# Patient Record
Sex: Female | Born: 1955 | Race: Black or African American | Hispanic: No | Marital: Single | Smoking: Current every day smoker
Health system: Southern US, Community
[De-identification: ages and names within clinical notes are randomized; demographics above are authoritative.]

## PROBLEM LIST (undated history)

## (undated) DIAGNOSIS — E7800 Pure hypercholesterolemia, unspecified: Secondary | ICD-10-CM

## (undated) DIAGNOSIS — I10 Essential (primary) hypertension: Secondary | ICD-10-CM

## (undated) DIAGNOSIS — M810 Age-related osteoporosis without current pathological fracture: Secondary | ICD-10-CM

## (undated) DIAGNOSIS — Z972 Presence of dental prosthetic device (complete) (partial): Secondary | ICD-10-CM

## (undated) DIAGNOSIS — J302 Other seasonal allergic rhinitis: Secondary | ICD-10-CM

## (undated) DIAGNOSIS — E119 Type 2 diabetes mellitus without complications: Secondary | ICD-10-CM

---

## 2004-01-09 ENCOUNTER — Ambulatory Visit: Payer: Self-pay | Admitting: Family Medicine

## 2004-10-08 ENCOUNTER — Ambulatory Visit: Payer: Self-pay | Admitting: Family Medicine

## 2005-03-03 ENCOUNTER — Ambulatory Visit: Payer: Self-pay | Admitting: Family Medicine

## 2005-04-18 ENCOUNTER — Ambulatory Visit: Payer: Self-pay | Admitting: Unknown Physician Specialty

## 2006-04-03 ENCOUNTER — Ambulatory Visit: Payer: Self-pay | Admitting: Family Medicine

## 2007-04-28 ENCOUNTER — Ambulatory Visit: Payer: Self-pay | Admitting: Family Medicine

## 2007-11-03 ENCOUNTER — Ambulatory Visit: Payer: Self-pay | Admitting: Family Medicine

## 2007-11-17 ENCOUNTER — Ambulatory Visit: Payer: Self-pay | Admitting: Family Medicine

## 2008-05-17 ENCOUNTER — Ambulatory Visit: Payer: Self-pay | Admitting: Family Medicine

## 2008-12-13 ENCOUNTER — Ambulatory Visit: Payer: Self-pay | Admitting: General Practice

## 2009-10-02 ENCOUNTER — Ambulatory Visit: Payer: Self-pay | Admitting: Family Medicine

## 2009-10-02 ENCOUNTER — Other Ambulatory Visit: Payer: Self-pay | Admitting: Family Medicine

## 2010-05-27 ENCOUNTER — Other Ambulatory Visit: Payer: Self-pay | Admitting: Family Medicine

## 2011-01-20 ENCOUNTER — Other Ambulatory Visit: Payer: Self-pay | Admitting: Physician Assistant

## 2011-07-09 ENCOUNTER — Other Ambulatory Visit: Payer: Self-pay | Admitting: Family Medicine

## 2011-07-09 LAB — COMPREHENSIVE METABOLIC PANEL
Alkaline Phosphatase: 61 U/L (ref 50–136)
Anion Gap: 8 (ref 7–16)
Bilirubin,Total: 0.3 mg/dL (ref 0.2–1.0)
Chloride: 101 mmol/L (ref 98–107)
Co2: 30 mmol/L (ref 21–32)
Creatinine: 0.66 mg/dL (ref 0.60–1.30)
EGFR (African American): >60
EGFR (Non-African Amer.): >60
Glucose: 128 mg/dL — ABNORMAL HIGH (ref 65–99)
Osmolality: 280 (ref 275–301)
Sodium: 139 mmol/L (ref 136–145)
Total Protein: 7.7 g/dL (ref 6.4–8.2)

## 2011-07-09 LAB — LIPID PANEL
HDL Cholesterol: 52 mg/dL (ref 40–60)
Triglycerides: 76 mg/dL (ref 0–200)
VLDL Cholesterol, Calc: 15 mg/dL (ref 5–40)

## 2011-10-29 ENCOUNTER — Ambulatory Visit: Payer: Self-pay | Admitting: Family Medicine

## 2012-02-11 HISTORY — PX: FOOT SURGERY: SHX648

## 2012-03-15 ENCOUNTER — Ambulatory Visit: Payer: Self-pay | Admitting: General Practice

## 2012-04-14 ENCOUNTER — Encounter: Payer: Self-pay | Admitting: Orthopedic Surgery

## 2012-05-11 ENCOUNTER — Encounter: Payer: Self-pay | Admitting: Orthopedic Surgery

## 2012-06-10 ENCOUNTER — Encounter: Payer: Self-pay | Admitting: Orthopedic Surgery

## 2012-10-22 ENCOUNTER — Other Ambulatory Visit: Payer: Self-pay | Admitting: Family Medicine

## 2012-10-22 LAB — COMPREHENSIVE METABOLIC PANEL
Anion Gap: 4 — ABNORMAL LOW (ref 7–16)
BUN: 15 mg/dL (ref 7–18)
Chloride: 107 mmol/L (ref 98–107)
Co2: 28 mmol/L (ref 21–32)
EGFR (African American): >60
EGFR (Non-African Amer.): >60
Glucose: 111 mg/dL — ABNORMAL HIGH (ref 65–99)
Osmolality: 279 (ref 275–301)
Potassium: 3.9 mmol/L (ref 3.5–5.1)
Sodium: 139 mmol/L (ref 136–145)
Total Protein: 7.2 g/dL (ref 6.4–8.2)

## 2012-10-22 LAB — LIPID PANEL
Cholesterol: 137 mg/dL (ref 0–200)
Triglycerides: 62 mg/dL (ref 0–200)

## 2012-10-22 LAB — TSH: Thyroid Stimulating Horm: 1.86 u[IU]/mL

## 2012-10-29 ENCOUNTER — Ambulatory Visit: Payer: Self-pay | Admitting: Family Medicine

## 2013-09-29 ENCOUNTER — Ambulatory Visit: Payer: Self-pay | Admitting: Family Medicine

## 2013-10-11 ENCOUNTER — Ambulatory Visit: Payer: Self-pay | Admitting: Family Medicine

## 2014-01-12 ENCOUNTER — Ambulatory Visit: Payer: Self-pay | Admitting: Family Medicine

## 2014-03-31 ENCOUNTER — Ambulatory Visit: Payer: Self-pay | Admitting: Family Medicine

## 2014-04-19 ENCOUNTER — Ambulatory Visit: Payer: Self-pay | Admitting: Family Medicine

## 2014-05-17 ENCOUNTER — Ambulatory Visit
Admit: 2014-05-17 | Disposition: A | Discharge status: Home / Self Care | Payer: Self-pay | Attending: Ophthalmology | Admitting: Ophthalmology

## 2014-05-17 HISTORY — PX: CATARACT EXTRACTION: SUR2

## 2014-07-18 ENCOUNTER — Encounter: Payer: Self-pay | Admitting: *Deleted

## 2014-07-19 ENCOUNTER — Other Ambulatory Visit: Payer: Self-pay | Admitting: Family Medicine

## 2014-07-25 NOTE — Discharge Instructions (Signed)

## 2014-07-26 ENCOUNTER — Encounter: Payer: Self-pay | Admitting: *Deleted

## 2014-07-26 ENCOUNTER — Encounter
Admission: RE | Disposition: A | Discharge status: Home / Self Care | Payer: Self-pay | Source: Ambulatory Visit | Attending: Ophthalmology

## 2014-07-26 ENCOUNTER — Ambulatory Visit: Payer: 59 | Admitting: Anesthesiology

## 2014-07-26 ENCOUNTER — Ambulatory Visit
Admission: RE | Admit: 2014-07-26 | Discharge: 2014-07-26 | Disposition: A | Discharge status: Home / Self Care | Payer: 59 | Source: Ambulatory Visit | Attending: Ophthalmology | Admitting: Ophthalmology

## 2014-07-26 DIAGNOSIS — H2511 Age-related nuclear cataract, right eye: Secondary | ICD-10-CM | POA: Insufficient documentation

## 2014-07-26 DIAGNOSIS — E780 Pure hypercholesterolemia: Secondary | ICD-10-CM | POA: Diagnosis not present

## 2014-07-26 DIAGNOSIS — E1136 Type 2 diabetes mellitus with diabetic cataract: Secondary | ICD-10-CM | POA: Insufficient documentation

## 2014-07-26 DIAGNOSIS — M810 Age-related osteoporosis without current pathological fracture: Secondary | ICD-10-CM | POA: Insufficient documentation

## 2014-07-26 DIAGNOSIS — F17200 Nicotine dependence, unspecified, uncomplicated: Secondary | ICD-10-CM | POA: Insufficient documentation

## 2014-07-26 DIAGNOSIS — Z9889 Other specified postprocedural states: Secondary | ICD-10-CM | POA: Diagnosis not present

## 2014-07-26 DIAGNOSIS — Z9842 Cataract extraction status, left eye: Secondary | ICD-10-CM | POA: Diagnosis not present

## 2014-07-26 DIAGNOSIS — Z79899 Other long term (current) drug therapy: Secondary | ICD-10-CM | POA: Insufficient documentation

## 2014-07-26 DIAGNOSIS — I10 Essential (primary) hypertension: Secondary | ICD-10-CM | POA: Diagnosis not present

## 2014-07-26 DIAGNOSIS — Z7982 Long term (current) use of aspirin: Secondary | ICD-10-CM | POA: Insufficient documentation

## 2014-07-26 HISTORY — DX: Essential (primary) hypertension: I10

## 2014-07-26 HISTORY — DX: Type 2 diabetes mellitus without complications: E11.9

## 2014-07-26 HISTORY — PX: CATARACT EXTRACTION W/PHACO: SHX586

## 2014-07-26 HISTORY — DX: Pure hypercholesterolemia, unspecified: E78.00

## 2014-07-26 HISTORY — DX: Other seasonal allergic rhinitis: J30.2

## 2014-07-26 HISTORY — DX: Presence of dental prosthetic device (complete) (partial): Z97.2

## 2014-07-26 HISTORY — DX: Age-related osteoporosis without current pathological fracture: M81.0

## 2014-07-26 LAB — GLUCOSE, CAPILLARY
Glucose-Capillary: 146 mg/dL — ABNORMAL HIGH (ref 65–99)
Glucose-Capillary: 136 mg/dL — ABNORMAL HIGH (ref 65–99)

## 2014-07-26 SURGERY — PHACOEMULSIFICATION, CATARACT, WITH IOL INSERTION
Anesthesia: Monitor Anesthesia Care | Laterality: Right | Wound class: Clean

## 2014-07-26 MED ORDER — BRIMONIDINE TARTRATE 0.2 % OP SOLN
OPHTHALMIC | Status: DC | PRN
  Administered 2014-07-26: 1 [drp] via OPHTHALMIC

## 2014-07-26 MED ORDER — TIMOLOL MALEATE 0.5 % OP SOLN
OPHTHALMIC | Status: DC | PRN
  Administered 2014-07-26: 1 [drp] via OPHTHALMIC

## 2014-07-26 MED ORDER — TETRACAINE HCL 0.5 % OP SOLN
1.0000 [drp] | OPHTHALMIC | Status: DC | PRN
  Administered 2014-07-26: 1 [drp] via OPHTHALMIC

## 2014-07-26 MED ORDER — NA HYALUR & NA CHOND-NA HYALUR 0.4-0.35 ML IO KIT
PACK | INTRAOCULAR | Status: DC | PRN
  Administered 2014-07-26: 1 mL via INTRAOCULAR

## 2014-07-26 MED ORDER — CEFUROXIME OPHTHALMIC INJECTION 1 MG/0.1 ML
INJECTION | OPHTHALMIC | Status: DC | PRN
  Administered 2014-07-26: 1 mg via INTRACAMERAL

## 2014-07-26 MED ORDER — EPINEPHRINE HCL 1 MG/ML IJ SOLN
INTRAMUSCULAR | Status: DC | PRN
  Administered 2014-07-26: 70 mL via OPHTHALMIC

## 2014-07-26 MED ORDER — ARMC OPHTHALMIC DILATING GEL
1.0000 "application " | OPHTHALMIC | Status: DC | PRN
  Administered 2014-07-26 (×2): 1 via OPHTHALMIC

## 2014-07-26 MED ORDER — MIDAZOLAM HCL 2 MG/2ML IJ SOLN
INTRAMUSCULAR | Status: DC | PRN
  Administered 2014-07-26: 2 mg via INTRAVENOUS

## 2014-07-26 MED ORDER — POVIDONE-IODINE 5 % OP SOLN
1.0000 "application " | OPHTHALMIC | Status: DC | PRN
  Administered 2014-07-26: 1 via OPHTHALMIC

## 2014-07-26 MED ORDER — FENTANYL CITRATE (PF) 100 MCG/2ML IJ SOLN
INTRAMUSCULAR | Status: DC | PRN
  Administered 2014-07-26: 50 ug via INTRAVENOUS

## 2014-07-26 SURGICAL SUPPLY — 26 items
CANNULA ANT/CHMB 27GA (MISCELLANEOUS) ×2 IMPLANT
GLOVE SURG LX 7.5 STRW (GLOVE) ×1
GLOVE SURG LX STRL 7.5 STRW (GLOVE) ×1 IMPLANT
GLOVE SURG TRIUMPH 8.0 PF LTX (GLOVE) ×2 IMPLANT
GOWN STRL REUS W/ TWL LRG LVL3 (GOWN DISPOSABLE) ×2 IMPLANT
GOWN STRL REUS W/TWL LRG LVL3 (GOWN DISPOSABLE) ×2
LENS IOL TECNIS 19.5 (Intraocular Lens) ×2 IMPLANT
LENS IOL TECNIS MONO 1P 19.5 (Intraocular Lens) ×1 IMPLANT
MARKER SKIN SURG W/RULER VIO (MISCELLANEOUS) ×2 IMPLANT
NDL RETROBULBAR .5 NSTRL (NEEDLE) IMPLANT
NEEDLE FILTER BLUNT 18X 1/2SAF (NEEDLE) ×1
NEEDLE FILTER BLUNT 18X1 1/2 (NEEDLE) ×1 IMPLANT
PACK CATARACT BRASINGTON (MISCELLANEOUS) ×2 IMPLANT
PACK EYE AFTER SURG (MISCELLANEOUS) ×2 IMPLANT
PACK OPTHALMIC (MISCELLANEOUS) ×2 IMPLANT
RING MALYGIN 7.0 (MISCELLANEOUS) IMPLANT
SUT ETHILON 10-0 CS-B-6CS-B-6 (SUTURE)
SUT VICRYL  9 0 (SUTURE)
SUT VICRYL 9 0 (SUTURE) IMPLANT
SUTURE EHLN 10-0 CS-B-6CS-B-6 (SUTURE) IMPLANT
SYR 3ML LL SCALE MARK (SYRINGE) ×2 IMPLANT
SYR 5ML LL (SYRINGE) IMPLANT
SYR TB 1ML LUER SLIP (SYRINGE) ×2 IMPLANT
WATER STERILE IRR 250ML POUR (IV SOLUTION) ×2 IMPLANT
WATER STERILE IRR 500ML POUR (IV SOLUTION) IMPLANT
WIPE NON LINTING 3.25X3.25 (MISCELLANEOUS) ×2 IMPLANT

## 2014-07-26 NOTE — Anesthesia Procedure Notes (Addendum)
Procedure Name: MAC Date/Time: 07/26/2014 8:48 AM Performed by: Andee Poles Pre-anesthesia Checklist: Patient identified, Emergency Drugs available, Suction available, Timeout performed and Patient being monitored Patient Re-evaluated:Patient Re-evaluated prior to inductionOxygen Delivery Method: Nasal cannula Placement Confirmation: positive ETCO2

## 2014-07-26 NOTE — H&P (Signed)
  The History and Physical notes were scanned in.  The patient remains stable and unchanged from the H&P.   Previous H&P reviewed, patient examined, and there are no changes.  Tracey Tucker 07/26/2014 8:04 AM  

## 2014-07-26 NOTE — Anesthesia Postprocedure Evaluation (Signed)
  Anesthesia Post-op Note  Patient: Tracey Tucker  Procedure(s) Performed: Procedure(s) with comments: CATARACT EXTRACTION PHACO AND INTRAOCULAR LENS PLACEMENT (IOC) (Right) - DIABETIC  Anesthesia type:MAC  Patient location: PACU  Post pain: Pain level controlled  Post assessment: Post-op Vital signs reviewed, Patient's Cardiovascular Status Stable, Respiratory Function Stable, Patent Airway and No signs of Nausea or vomiting  Post vital signs: Reviewed and stable  Last Vitals:  Filed Vitals:   07/26/14 0859  BP:   Pulse:   Temp: 36.3 C  Resp:     Level of consciousness: awake, alert  and patient cooperative  Complications: No apparent anesthesia complications

## 2014-07-26 NOTE — Op Note (Signed)
LOCATION:  [facility]   PREOPERATIVE DIAGNOSIS:    Nuclear sclerotic cataract right eye. H25.11   POSTOPERATIVE DIAGNOSIS:  Nuclear sclerotic cataract right eye.     PROCEDURE:  Phacoemusification with posterior chamber intraocular lens placement of the right eye   LENS:   Implant Name Type Inv. Item Serial No. Manufacturer Lot No. LRB No. Used  LENS IMPL INTRAOC [id number] 19.5 - [id number] Intraocular Lens LENS IMPL INTRAOC [id number] 19.5 [id number]   Right 1        ULTRASOUND TIME: 10 % of 1 minutes, 4 seconds.  CDE 6.4   SURGEON:  Deirdre Evener, MD   ANESTHESIA:  Topical with tetracaine drops and 2% Xylocaine jelly.   COMPLICATIONS:  None.   DESCRIPTION OF PROCEDURE:  The patient was identified in the holding room and transported to the operating room and placed in the supine position under the operating microscope.  The right eye was identified as the operative eye and it was prepped and draped in the usual sterile ophthalmic fashion.   A 1 millimeter clear-corneal paracentesis was made at the 12:00 position.  The anterior chamber was filled with Viscoat viscoelastic.  A 2.4 millimeter keratome was used to make a near-clear corneal incision at the 9:00 position.  A curvilinear capsulorrhexis was made with a cystotome and capsulorrhexis forceps.  Balanced salt solution was used to hydrodissect and hydrodelineate the nucleus.   Phacoemulsification was then used in stop and chop fashion to remove the lens nucleus and epinucleus.  The remaining cortex was then removed using the irrigation and aspiration handpiece. Provisc was then placed into the capsular bag to distend it for lens placement.  A lens was then injected into the capsular bag.  The remaining viscoelastic was aspirated.   Wounds were hydrated with balanced salt solution.  The anterior chamber was inflated to a physiologic pressure with balanced salt solution.  No wound leaks were noted. Cefuroxime 0.1 ml of a  10mg /ml solution was injected into the anterior chamber for a dose of 1 mg of intracameral antibiotic at the completion of the case.   Timolol and Brimonidine drops were applied to the eye.  The patient was taken to the recovery room in stable condition without complications of anesthesia or surgery.   Jamela Cumbo 07/26/2014, 8:57 AM

## 2014-07-26 NOTE — Transfer of Care (Signed)
Immediate Anesthesia Transfer of Care Note  Patient: Tracey Tucker  Procedure(s) Performed: Procedure(s) with comments: CATARACT EXTRACTION PHACO AND INTRAOCULAR LENS PLACEMENT (IOC) (Right) - DIABETIC  Patient Location: PACU  Anesthesia Type: MAC  Level of Consciousness: awake, alert  and patient cooperative  Airway and Oxygen Therapy: Patient Spontanous Breathing and Patient connected to supplemental oxygen  Post-op Assessment: Post-op Vital signs reviewed, Patient's Cardiovascular Status Stable, Respiratory Function Stable, Patent Airway and No signs of Nausea or vomiting  Post-op Vital Signs: Reviewed and stable  Complications: No apparent anesthesia complications

## 2014-07-26 NOTE — Anesthesia Preprocedure Evaluation (Signed)
Anesthesia Evaluation    Airway Mallampati: II  TM Distance: >3 FB Neck ROM: Full    Dental no notable dental hx.    Pulmonary Current Smoker,  breath sounds clear to auscultation  Pulmonary exam normal       Cardiovascular hypertension, Normal cardiovascular examRhythm:Regular Rate:Normal     Neuro/Psych    GI/Hepatic   Endo/Other  diabetes  Renal/GU      Musculoskeletal   Abdominal   Peds  Hematology   Anesthesia Other Findings   Reproductive/Obstetrics                             Anesthesia Physical Anesthesia Plan  ASA: II  Anesthesia Plan: MAC   Post-op Pain Management:    Induction: Intravenous  Airway Management Planned:   Additional Equipment:   Intra-op Plan:   Post-operative Plan: Extubation in OR  Informed Consent: I have reviewed the patients History and Physical, chart, labs and discussed the procedure including the risks, benefits and alternatives for the proposed anesthesia with the patient or authorized representative who has indicated his/her understanding and acceptance.   Dental advisory given  Plan Discussed with: CRNA  Anesthesia Plan Comments:         Anesthesia Quick Evaluation

## 2014-07-27 ENCOUNTER — Encounter: Payer: Self-pay | Admitting: Ophthalmology

## 2014-08-03 ENCOUNTER — Other Ambulatory Visit: Payer: Self-pay | Admitting: Family Medicine

## 2014-08-04 ENCOUNTER — Other Ambulatory Visit: Payer: Self-pay | Admitting: Emergency Medicine

## 2014-08-04 MED ORDER — PROPRANOLOL HCL 10 MG PO TABS: 10.0000 mg | ORAL_TABLET | Freq: Two times a day (BID) | ORAL | Status: DC

## 2014-08-04 NOTE — Telephone Encounter (Signed)
Script sent to [facility]

## 2014-08-22 ENCOUNTER — Ambulatory Visit (INDEPENDENT_AMBULATORY_CARE_PROVIDER_SITE_OTHER): Payer: 59 | Admitting: Family Medicine

## 2014-08-22 ENCOUNTER — Encounter: Payer: Self-pay | Admitting: Family Medicine

## 2014-08-22 ENCOUNTER — Encounter (INDEPENDENT_AMBULATORY_CARE_PROVIDER_SITE_OTHER): Payer: Self-pay

## 2014-08-22 VITALS — BP 110/64 | HR 87 | Temp 98.6°F | Resp 16 | Ht 59.0 in | Wt 185.5 lb

## 2014-08-22 DIAGNOSIS — E119 Type 2 diabetes mellitus without complications: Secondary | ICD-10-CM

## 2014-08-22 DIAGNOSIS — I152 Hypertension secondary to endocrine disorders: Secondary | ICD-10-CM | POA: Insufficient documentation

## 2014-08-22 DIAGNOSIS — I10 Essential (primary) hypertension: Secondary | ICD-10-CM | POA: Diagnosis not present

## 2014-08-22 DIAGNOSIS — E1165 Type 2 diabetes mellitus with hyperglycemia: Secondary | ICD-10-CM | POA: Insufficient documentation

## 2014-08-22 DIAGNOSIS — E669 Obesity, unspecified: Secondary | ICD-10-CM | POA: Insufficient documentation

## 2014-08-22 DIAGNOSIS — E1159 Type 2 diabetes mellitus with other circulatory complications: Secondary | ICD-10-CM | POA: Insufficient documentation

## 2014-08-22 DIAGNOSIS — E785 Hyperlipidemia, unspecified: Secondary | ICD-10-CM

## 2014-08-22 DIAGNOSIS — E1169 Type 2 diabetes mellitus with other specified complication: Secondary | ICD-10-CM | POA: Insufficient documentation

## 2014-08-22 LAB — POCT GLYCOSYLATED HEMOGLOBIN (HGB A1C): Hemoglobin A1C: 7.1

## 2014-08-22 LAB — GLUCOSE, POCT (MANUAL RESULT ENTRY): POC Glucose: 98 mg/dl (ref 70–99)

## 2014-08-22 NOTE — Patient Instructions (Signed)

## 2014-08-22 NOTE — Progress Notes (Signed)
Name: Tracey Tucker   MRN: [id number]    DOB: 10-23-1955   Date:08/22/2014       Progress Note  Subjective  Chief Complaint  Chief Complaint  Patient presents with  . Hypertension  . Diabetes  . Hyperlipidemia    Hypertension This is a chronic problem. The current episode started more than 1 year ago. The problem is unchanged. The problem is controlled. Associated symptoms include blurred vision. Pertinent negatives include no chest pain, headaches, neck pain, orthopnea, palpitations or shortness of breath. There are no associated agents to hypertension. Risk factors for coronary artery disease include diabetes mellitus, dyslipidemia, family history, obesity, post-menopausal state, sedentary lifestyle, smoking/tobacco exposure and stress. Past treatments include angiotensin blockers, beta blockers and diuretics. The current treatment provides moderate improvement. There are no compliance problems.   Diabetes She presents for her follow-up diabetic visit. She has type 2 diabetes mellitus. Her disease course has been stable. There are no hypoglycemic associated symptoms. Pertinent negatives for hypoglycemia include no dizziness, headaches, nervousness/anxiousness, seizures or tremors. Associated symptoms include blurred vision. Pertinent negatives for diabetes include no chest pain, no weakness and no weight loss. Symptoms are stable. Current diabetic treatment includes oral agent (triple therapy). She is compliant with treatment all of the time. Her weight is decreasing steadily. She is following a diabetic diet. She rarely participates in exercise. Her overall blood glucose range is 90-110 mg/dl.  Hyperlipidemia This is a chronic problem. The current episode started more than 1 year ago. The problem is controlled. Recent lipid tests were reviewed and are normal. Exacerbating diseases include diabetes and obesity. Factors aggravating her hyperlipidemia include fatty foods. Pertinent negatives include  no chest pain, focal weakness, myalgias or shortness of breath. Current antihyperlipidemic treatment includes statins. The current treatment provides moderate improvement of lipids.      Past Medical History  Diagnosis Date  . Diabetes mellitus without complication   . Hypercholesteremia   . Hypertension   . Seasonal allergies   . Osteoporosis   . Wears dentures     full upper, partial lower    History  Substance Use Topics  . Smoking status: Current Every Day Smoker -- 1.00 packs/day for 20 years    Types: Cigarettes  . Smokeless tobacco: Not on file  . Alcohol Use: No     Current outpatient prescriptions:  .  aspirin 81 MG tablet, Take 81 mg by mouth daily. PM, Disp: , Rfl:  .  benazepril (LOTENSIN) 40 MG tablet, Take 40 mg by mouth daily. AM, Disp: , Rfl:  .  bumetanide (BUMEX) 0.5 MG tablet, TAKE 1 TABLET BY MOUTH TWICE DAILY, Disp: 180 tablet, Rfl: 1 .  glyBURIDE (DIABETA) 5 MG tablet, Take 5 mg by mouth 2 (two) times daily with a meal., Disp: , Rfl:  .  latanoprost (XALATAN) 0.005 % ophthalmic solution, 1 drop at bedtime., Disp: , Rfl:  .  metFORMIN (GLUCOPHAGE) 500 MG tablet, Take by mouth 2 (two) times daily with a meal., Disp: , Rfl:  .  pioglitazone (ACTOS) 30 MG tablet, Take 30 mg by mouth daily. AM, Disp: , Rfl:  .  propranolol (INDERAL) 10 MG tablet, TAKE 1 TABLET BY MOUTH TWICE A DAY, Disp: 60 tablet, Rfl: 5 .  simvastatin (ZOCOR) 40 MG tablet, Take 40 mg by mouth daily. PM, Disp: , Rfl:   No Known Allergies  Review of Systems  Constitutional: Negative for fever, chills and weight loss.  HENT: Negative for congestion, hearing loss, sore throat and  tinnitus.   Eyes: Positive for blurred vision. Negative for double vision and redness.  Respiratory: Negative for cough, hemoptysis and shortness of breath.   Cardiovascular: Negative for chest pain, palpitations, orthopnea, claudication and leg swelling.  Gastrointestinal: Negative for heartburn, nausea, vomiting,  diarrhea, constipation and blood in stool.  Genitourinary: Negative for dysuria, urgency, frequency and hematuria.  Musculoskeletal: Negative for myalgias, back pain, joint pain, falls and neck pain.  Skin: Negative for itching.  Neurological: Negative for dizziness, tingling, tremors, focal weakness, seizures, loss of consciousness, weakness and headaches.  Endo/Heme/Allergies: Does not bruise/bleed easily.  Psychiatric/Behavioral: Negative for depression and substance abuse. The patient is not nervous/anxious and does not have insomnia.      Objective  Filed Vitals:   08/22/14 1012  BP: 110/64  Pulse: 87  Temp: 98.6 F (37 C)  TempSrc: Oral  Resp: 16  Height: 4\' 11"  (1.499 m)  Weight: 185 lb 8 oz (84.142 kg)  SpO2: 98%     Physical Exam  Constitutional: She is oriented to person, place, and time and well-developed, well-nourished, and in no distress.  Obese  HENT:  Head: Normocephalic.  Eyes: EOM are normal. Pupils are equal, round, and reactive to light.  Neck: Normal range of motion. No thyromegaly present.  Cardiovascular: Normal rate, regular rhythm and normal heart sounds.   No murmur heard. Pulmonary/Chest: Effort normal and breath sounds normal.  Abdominal: Soft. Bowel sounds are normal.  Musculoskeletal: Normal range of motion. She exhibits no edema.  Neurological: She is alert and oriented to person, place, and time. No cranial nerve deficit. Gait normal.  Skin: Skin is warm and dry. No rash noted.  Psychiatric: Memory and affect normal.      Assessment & Plan  1. Type 2 diabetes mellitus without complication Near goal well-controlled - POCT HgB A1C - POCT Glucose (CBG)  2. Essential hypertension Well-controlled  3. Hyperlipidemia Lipid panel today  4. Obesity (BMI 35.0-39.9 without comorbidity) Handout

## 2014-08-22 NOTE — Addendum Note (Signed)
Addended by: Dennison MascotMORRISEY, Kathrine Rieves on: 08/22/2014 10:51 AM   Modules accepted: Orders

## 2014-08-30 ENCOUNTER — Other Ambulatory Visit
Admission: RE | Admit: 2014-08-30 | Discharge: 2014-08-30 | Disposition: A | Discharge status: Home / Self Care | Payer: 59 | Source: Ambulatory Visit | Attending: Family Medicine | Admitting: Family Medicine

## 2014-08-30 DIAGNOSIS — E785 Hyperlipidemia, unspecified: Secondary | ICD-10-CM | POA: Insufficient documentation

## 2014-08-30 LAB — LIPID PANEL
Cholesterol: 142 mg/dL (ref 0–200)
HDL: 54 mg/dL (ref >40)
LDL CALC: 76 mg/dL (ref 0–99)
Total CHOL/HDL Ratio: 2.6 RATIO
Triglycerides: 58 mg/dL (ref <150)
VLDL: 12 mg/dL (ref 0–40)

## 2014-08-30 LAB — COMPREHENSIVE METABOLIC PANEL
ALT: 14 U/L (ref 14–54)
ANION GAP: 12 (ref 5–15)
AST: 25 U/L (ref 15–41)
Albumin: 4.3 g/dL (ref 3.5–5.0)
Alkaline Phosphatase: 49 U/L (ref 38–126)
BILIRUBIN TOTAL: 0.4 mg/dL (ref 0.3–1.2)
BUN: 20 mg/dL (ref 6–20)
CHLORIDE: 98 mmol/L — AB (ref 101–111)
CO2: 26 mmol/L (ref 22–32)
Calcium: 9.8 mg/dL (ref 8.9–10.3)
Creatinine, Ser: 0.86 mg/dL (ref 0.44–1.00)
GFR calc Af Amer: >60 mL/min (ref >60)
GFR calc non Af Amer: >60 mL/min (ref >60)
Glucose, Bld: 150 mg/dL — ABNORMAL HIGH (ref 65–99)
Potassium: 4 mmol/L (ref 3.5–5.1)
SODIUM: 136 mmol/L (ref 135–145)
Total Protein: 7.4 g/dL (ref 6.5–8.1)

## 2014-08-30 LAB — TSH: TSH: 2.1 u[IU]/mL (ref 0.350–4.500)

## 2014-08-31 ENCOUNTER — Telehealth: Payer: Self-pay | Admitting: Emergency Medicine

## 2014-08-31 NOTE — Telephone Encounter (Signed)
Patient notified of stable lab results.

## 2014-12-15 ENCOUNTER — Other Ambulatory Visit: Payer: Self-pay | Admitting: Family Medicine

## 2014-12-25 ENCOUNTER — Ambulatory Visit
Admission: RE | Admit: 2014-12-25 | Discharge: 2014-12-25 | Disposition: A | Discharge status: Home / Self Care | Payer: 59 | Source: Ambulatory Visit | Attending: Family Medicine | Admitting: Family Medicine

## 2014-12-25 ENCOUNTER — Ambulatory Visit (INDEPENDENT_AMBULATORY_CARE_PROVIDER_SITE_OTHER): Payer: 59 | Admitting: Family Medicine

## 2014-12-25 ENCOUNTER — Encounter: Payer: Self-pay | Admitting: Family Medicine

## 2014-12-25 ENCOUNTER — Telehealth: Payer: Self-pay | Admitting: Emergency Medicine

## 2014-12-25 VITALS — BP 110/62 | HR 96 | Temp 98.5°F | Resp 14 | Ht 59.0 in | Wt 183.6 lb

## 2014-12-25 DIAGNOSIS — Z23 Encounter for immunization: Secondary | ICD-10-CM | POA: Diagnosis not present

## 2014-12-25 DIAGNOSIS — I10 Essential (primary) hypertension: Secondary | ICD-10-CM | POA: Diagnosis not present

## 2014-12-25 DIAGNOSIS — M1712 Unilateral primary osteoarthritis, left knee: Secondary | ICD-10-CM | POA: Insufficient documentation

## 2014-12-25 DIAGNOSIS — E785 Hyperlipidemia, unspecified: Secondary | ICD-10-CM

## 2014-12-25 DIAGNOSIS — M179 Osteoarthritis of knee, unspecified: Secondary | ICD-10-CM | POA: Diagnosis not present

## 2014-12-25 DIAGNOSIS — E1169 Type 2 diabetes mellitus with other specified complication: Secondary | ICD-10-CM

## 2014-12-25 LAB — POCT UA - MICROALBUMIN: MICROALBUMIN (UR) POC: 20 mg/L

## 2014-12-25 LAB — GLUCOSE, POCT (MANUAL RESULT ENTRY): POC Glucose: 136 mg/dl — AB (ref 70–99)

## 2014-12-25 LAB — POCT GLYCOSYLATED HEMOGLOBIN (HGB A1C): Hemoglobin A1C: 7.1

## 2014-12-25 MED ORDER — BENAZEPRIL HCL 40 MG PO TABS: 40.0000 mg | ORAL_TABLET | Freq: Every day | ORAL | Status: DC

## 2014-12-25 MED ORDER — SIMVASTATIN 40 MG PO TABS: ORAL_TABLET | ORAL | Status: DC

## 2014-12-25 MED ORDER — MELOXICAM 15 MG PO TABS: 15.0000 mg | ORAL_TABLET | Freq: Every day | ORAL | Status: DC

## 2014-12-25 MED ORDER — GLYBURIDE 5 MG PO TABS: 5.0000 mg | ORAL_TABLET | Freq: Two times a day (BID) | ORAL | Status: DC

## 2014-12-25 MED ORDER — PROPRANOLOL HCL 10 MG PO TABS: 10.0000 mg | ORAL_TABLET | Freq: Two times a day (BID) | ORAL | Status: DC

## 2014-12-25 MED ORDER — PIOGLITAZONE HCL 30 MG PO TABS: 30.0000 mg | ORAL_TABLET | Freq: Every day | ORAL | Status: DC

## 2014-12-25 NOTE — Telephone Encounter (Signed)
Patient notified of x-ray results.

## 2014-12-25 NOTE — Progress Notes (Signed)
Name: Tracey Tucker   MRN: [id number]    DOB: [birth year]   Date:12/25/2014       Progress Note  Subjective  Chief Complaint  Chief Complaint  Patient presents with  . Hypertension    4 month follow up  . Diabetes  . Hyperlipidemia  . Leg Pain    left leg for 3 days    HPI  Diabetes  Patient presents for follow-up of diabetes which is present for over 5 years. Is currently on a regimen of metformin 500 mg daily and Actos 30 mg along with glyburide 5 mg daily. Patient states is sometimes with their diet and exercise. There's been no hypoglycemic episodes and there is no polyuria polydipsia polyphagia. His average fasting glucoses been in the low around like with a high around blank . There is no end organ disease.  Last diabetic eye exam was less than one year ago.   Last visit with dietitian was less than one year ago. Last microalbumin was obtained today and is 20 which is normal .   Hypertension   Patient presents for follow-up of hypertension. It has been present for over 5 years.  Patient states that there is compliance with medical regimen which consists of propranolol 10 mg daily benazepril 40 mg daily with Bumex 0.5 mg daily . There is no end organ disease. Cardiac risk factors include hypertension hyperlipidemia and diabetes. And obesity  Exercise regimen consist of some walking .  Diet consist of ADA .  Hyperlipidemia  Patient has a history of hyperlipidemia for over 5 years.  Current medical regimen consist of simvastatin 40 mg daily at bedtime .  Compliance is good .  Diet and exercise are currently followed well .   Marland Kitchen.   There have been no side effects from the medication.    Leg pain  Patient states she was walking about 3 days ago when she had the sudden onset of left knee pain which is now more the posterior area. There was no locking or popping or giving way of the knee while she was ambulating. There is no history of any antecedent trauma. She's been tried  over-the-counter meds and has placed a knee brace on the area.    Past Medical History  Diagnosis Date  . Diabetes mellitus without complication (HCC)   . Hypercholesteremia   . Hypertension   . Seasonal allergies   . Osteoporosis   . Wears dentures     full upper, partial lower    Social History  Substance Use Topics  . Smoking status: Current Every Day Smoker -- 1.00 packs/day for 20 years    Types: Cigarettes  . Smokeless tobacco: Not on file  . Alcohol Use: No     Current outpatient prescriptions:  .  aspirin 81 MG tablet, Take 81 mg by mouth daily. PM, Disp: , Rfl:  .  benazepril (LOTENSIN) 40 MG tablet, Take 40 mg by mouth daily. AM, Disp: , Rfl:  .  bumetanide (BUMEX) 0.5 MG tablet, TAKE 1 TABLET BY MOUTH TWICE DAILY, Disp: 180 tablet, Rfl: 1 .  glyBURIDE (DIABETA) 5 MG tablet, Take 5 mg by mouth 2 (two) times daily with a meal., Disp: , Rfl:  .  latanoprost (XALATAN) 0.005 % ophthalmic solution, 1 drop at bedtime., Disp: , Rfl:  .  metFORMIN (GLUCOPHAGE) 500 MG tablet, Take by mouth 2 (two) times daily with a meal., Disp: , Rfl:  .  pioglitazone (ACTOS) 30 MG tablet, Take 30 mg  by mouth daily. AM, Disp: , Rfl:  .  propranolol (INDERAL) 10 MG tablet, TAKE 1 TABLET BY MOUTH TWICE A DAY, Disp: 60 tablet, Rfl: 5 .  simvastatin (ZOCOR) 40 MG tablet, TAKE 1 TABLET BY MOUTH NIGHTLY AT BEDTIME, Disp: 90 tablet, Rfl: 1  No Known Allergies  Review of Systems  Constitutional: Negative for fever, chills and weight loss.  HENT: Negative for congestion, hearing loss, sore throat and tinnitus.   Eyes: Negative for blurred vision, double vision and redness.  Respiratory: Negative for cough, hemoptysis and shortness of breath.   Cardiovascular: Negative for chest pain, palpitations, orthopnea, claudication and leg swelling.  Gastrointestinal: Negative for heartburn, nausea, vomiting, diarrhea, constipation and blood in stool.  Genitourinary: Negative for dysuria, urgency, frequency  and hematuria.  Musculoskeletal: Positive for joint pain (left legleft knee pain). Negative for myalgias, back pain, falls and neck pain.  Skin: Negative for itching.  Neurological: Negative for dizziness, tingling, tremors, focal weakness, seizures, loss of consciousness, weakness and headaches.  Endo/Heme/Allergies: Does not bruise/bleed easily.  Psychiatric/Behavioral: Negative for depression and substance abuse. The patient is not nervous/anxious and does not have insomnia.      Objective  Filed Vitals:   12/25/14 1026  BP: 110/62  Pulse: 96  Temp: 98.5 F (36.9 C)  TempSrc: Oral  Resp: 14  Height:  (1.499 m)  Weight: 183 lb 9.6 oz (83.28 kg)  SpO2: 96%     Physical Exam  Constitutional: She is oriented to person, place, and time.  Obese female in no acute distress  HENT:  Head: Normocephalic.  Eyes: EOM are normal. Pupils are equal, round, and reactive to light.  Neck: Normal range of motion. No thyromegaly present.  Cardiovascular: Normal rate, regular rhythm and normal heart sounds.   No murmur heard. Pulmonary/Chest: Effort normal and breath sounds normal.  Abdominal: Soft. Bowel sounds are normal.  Musculoskeletal: She exhibits no edema.  There is some mild valgus the allergy lists deformity of the left knee. There is no effusion  Neurological: She is alert and oriented to person, place, and time. No cranial nerve deficit. Gait normal.  Skin: Skin is warm and dry. No rash noted.  Psychiatric: Memory and affect normal.      Assessment & Plan  1. Type 2 diabetes mellitus with other specified complication (HCC) Near goal - POCT HgB A1C - POCT Glucose (CBG) - POCT UA - Microalbumin  2. Osteoarthritis of left knee, unspecified osteoarthritis type X-ray and continue using her sleeve - meloxicam (MOBIC) 15 MG tablet; Take 1 tablet (15 mg total) by mouth daily.  Dispense: 30 tablet; Refill: 2 - DG Knee Complete 4 Views Left; Future  3. Need for  pneumococcal vaccination Given today - Pneumococcal polysaccharide vaccine 23-valent greater than or equal to 2yo subcutaneous/IM  4. Hyperlipidemia Well-controlled  5. Essential hypertension Well-controlled

## 2014-12-26 ENCOUNTER — Telehealth: Payer: Self-pay | Admitting: Family Medicine

## 2014-12-26 NOTE — Telephone Encounter (Signed)
Pt would like to know if a mild pain meds can be called in for her knee pain. After walking on it all day its really hurting her. [facility].

## 2014-12-28 MED ORDER — TRAMADOL HCL 50 MG PO TABS: 50.0000 mg | ORAL_TABLET | Freq: Three times a day (TID) | ORAL | Status: DC | PRN

## 2014-12-28 NOTE — Telephone Encounter (Signed)
Patient called to come by office to pick up script for Tramadol

## 2015-01-11 ENCOUNTER — Other Ambulatory Visit: Payer: Self-pay | Admitting: Family Medicine

## 2015-01-30 ENCOUNTER — Other Ambulatory Visit: Payer: Self-pay | Admitting: Family Medicine

## 2015-04-24 ENCOUNTER — Ambulatory Visit: Payer: 59 | Admitting: Family Medicine

## 2015-04-30 DIAGNOSIS — H04123 Dry eye syndrome of bilateral lacrimal glands: Secondary | ICD-10-CM | POA: Diagnosis not present

## 2015-07-04 ENCOUNTER — Other Ambulatory Visit: Payer: Self-pay | Admitting: Family Medicine

## 2015-07-06 ENCOUNTER — Telehealth: Payer: Self-pay | Admitting: Family Medicine

## 2015-07-06 NOTE — Telephone Encounter (Signed)
errenous °

## 2015-07-23 ENCOUNTER — Other Ambulatory Visit: Payer: Self-pay | Admitting: Family Medicine

## 2015-08-13 ENCOUNTER — Other Ambulatory Visit: Payer: Self-pay | Admitting: Family Medicine

## 2015-08-27 ENCOUNTER — Other Ambulatory Visit: Payer: Self-pay | Admitting: Family Medicine

## 2015-08-30 NOTE — Telephone Encounter (Signed)
Patient is scheduled to see Dr. Sherryll BurgerShah on 09/18/15.  She is needing a refill on all of her medications because she is completely out.  Would like enough until to last until her visit.  [facility]  Please call patient once complete.

## 2015-09-03 ENCOUNTER — Telehealth: Payer: Self-pay | Admitting: Family Medicine

## 2015-09-05 ENCOUNTER — Other Ambulatory Visit: Payer: Self-pay | Admitting: Emergency Medicine

## 2015-09-05 MED ORDER — BENAZEPRIL HCL 40 MG PO TABS: ORAL_TABLET | ORAL | 0 refills | Status: DC

## 2015-09-06 ENCOUNTER — Other Ambulatory Visit: Payer: Self-pay | Admitting: Emergency Medicine

## 2015-09-06 MED ORDER — PROPRANOLOL HCL 10 MG PO TABS: 10.0000 mg | ORAL_TABLET | Freq: Two times a day (BID) | ORAL | 0 refills | Status: DC

## 2015-09-18 ENCOUNTER — Ambulatory Visit (INDEPENDENT_AMBULATORY_CARE_PROVIDER_SITE_OTHER): Payer: 59 | Admitting: Family Medicine

## 2015-09-18 ENCOUNTER — Encounter: Payer: Self-pay | Admitting: Family Medicine

## 2015-09-18 VITALS — BP 115/76 | HR 98 | Temp 98.2°F | Resp 17 | Ht 59.0 in | Wt 191.4 lb

## 2015-09-18 DIAGNOSIS — I10 Essential (primary) hypertension: Secondary | ICD-10-CM | POA: Diagnosis not present

## 2015-09-18 DIAGNOSIS — E785 Hyperlipidemia, unspecified: Secondary | ICD-10-CM | POA: Diagnosis not present

## 2015-09-18 DIAGNOSIS — M1712 Unilateral primary osteoarthritis, left knee: Secondary | ICD-10-CM | POA: Insufficient documentation

## 2015-09-18 DIAGNOSIS — E119 Type 2 diabetes mellitus without complications: Secondary | ICD-10-CM | POA: Diagnosis not present

## 2015-09-18 DIAGNOSIS — M129 Arthropathy, unspecified: Secondary | ICD-10-CM

## 2015-09-18 LAB — GLUCOSE, POCT (MANUAL RESULT ENTRY): POC GLUCOSE: 124 mg/dL — AB (ref 70–99)

## 2015-09-18 LAB — POCT GLYCOSYLATED HEMOGLOBIN (HGB A1C): Hemoglobin A1C: 8

## 2015-09-18 MED ORDER — BUMETANIDE 0.5 MG PO TABS: 0.5000 mg | ORAL_TABLET | Freq: Two times a day (BID) | ORAL | 0 refills | Status: DC

## 2015-09-18 MED ORDER — SIMVASTATIN 40 MG PO TABS: ORAL_TABLET | ORAL | 1 refills | Status: DC

## 2015-09-18 MED ORDER — SITAGLIPTIN PHOSPHATE 100 MG PO TABS: 100.0000 mg | ORAL_TABLET | Freq: Every day | ORAL | 0 refills | Status: DC

## 2015-09-18 MED ORDER — METFORMIN HCL 1000 MG PO TABS: 1000.0000 mg | ORAL_TABLET | Freq: Two times a day (BID) | ORAL | 0 refills | Status: DC

## 2015-09-18 MED ORDER — TRAMADOL HCL 50 MG PO TABS: 50.0000 mg | ORAL_TABLET | Freq: Three times a day (TID) | ORAL | 0 refills | Status: DC | PRN

## 2015-09-18 MED ORDER — PIOGLITAZONE HCL 30 MG PO TABS: 30.0000 mg | ORAL_TABLET | Freq: Every day | ORAL | 0 refills | Status: DC

## 2015-09-18 MED ORDER — BENAZEPRIL HCL 40 MG PO TABS: ORAL_TABLET | ORAL | 0 refills | Status: DC

## 2015-09-18 MED ORDER — GLYBURIDE 5 MG PO TABS: 5.0000 mg | ORAL_TABLET | Freq: Two times a day (BID) | ORAL | 0 refills | Status: DC

## 2015-09-18 MED ORDER — PROPRANOLOL HCL 10 MG PO TABS: 10.0000 mg | ORAL_TABLET | Freq: Two times a day (BID) | ORAL | 0 refills | Status: DC

## 2015-09-18 NOTE — Progress Notes (Signed)
Name: Tracey Tucker   MRN: [id number]    DOB: [birth year]   Date:09/18/2015       Progress Note  Subjective  Chief Complaint  Chief Complaint  Patient presents with  . Medication Refill  This patient is followed by Dr. Thana Ates, new to me.  Diabetes  She presents for her follow-up diabetic visit. She has type 2 diabetes mellitus. Her disease course has been worsening. There are no hypoglycemic associated symptoms. Pertinent negatives for hypoglycemia include no headaches. Pertinent negatives for diabetes include no blurred vision, no chest pain, no fatigue, no foot paresthesias, no polydipsia and no polyuria. Current diabetic treatment includes oral agent (triple therapy). Home blood sugar record trend: Pt. does not check her blood glucose in the morning.  Hypertension  This is a chronic problem. The problem is unchanged. Pertinent negatives include no blurred vision, chest pain, headaches, palpitations or shortness of breath. Past treatments include ACE inhibitors and beta blockers.  Hyperlipidemia  This is a chronic problem. The problem is controlled. Recent lipid tests were reviewed and are normal. Pertinent negatives include no chest pain, leg pain, myalgias or shortness of breath. Current antihyperlipidemic treatment includes statins.  Arthritis  Presents for follow-up visit. She complains of pain and joint swelling. Affected locations include the left knee. Pertinent negatives include no fatigue.    Past Medical History:  Diagnosis Date  . Diabetes mellitus without complication (HCC)   . Hypercholesteremia   . Hypertension   . Osteoporosis   . Seasonal allergies   . Wears dentures    full upper, partial lower    Past Surgical History:  Procedure Laterality Date  . CATARACT EXTRACTION Left 05/17/14   [facility]  . CATARACT EXTRACTION W/PHACO Right 07/26/2014   Procedure: CATARACT EXTRACTION PHACO AND INTRAOCULAR LENS PLACEMENT (IOC);  Surgeon: Lockie Mola, MD;   Location: [facility];  Service: Ophthalmology;  Laterality: Right;  DIABETIC  . CESAREAN SECTION    . FOOT SURGERY  2014    Family History  Problem Relation Age of Onset  . Diabetes Father   . Diabetes Sister   . Hyperlipidemia Sister   . Hypertension Sister   . Diabetes Brother   . Hypertension Brother     Social History   Social History  . Marital status: Single    Spouse name: N/A  . Number of children: N/A  . Years of education: N/A   Occupational History  . Not on file.   Social History Main Topics  . Smoking status: Current Every Day Smoker    Packs/day: 1.00    Years: 20.00    Types: Cigarettes  . Smokeless tobacco: Never Used  . Alcohol use No  . Drug use: No  . Sexual activity: Not Currently   Other Topics Concern  . Not on file   Social History Narrative  . No narrative on file     Current Outpatient Prescriptions:  .  aspirin 81 MG tablet, Take 81 mg by mouth daily. PM, Disp: , Rfl:  .  benazepril (LOTENSIN) 40 MG tablet, TAKE 1 TABLET (40 MG TOTAL) BY MOUTH DAILY IN THE MORNING, Disp: 30 tablet, Rfl: 0 .  bumetanide (BUMEX) 0.5 MG tablet, TAKE 1 TABLET BY MOUTH TWICE DAILY, Disp: 180 tablet, Rfl: 0 .  glyBURIDE (DIABETA) 5 MG tablet, Take 1 tablet (5 mg total) by mouth 2 (two) times daily with a meal., Disp: 60 tablet, Rfl: 5 .  latanoprost (XALATAN) 0.005 % ophthalmic solution, 1 drop  at bedtime., Disp: , Rfl:  .  metFORMIN (GLUCOPHAGE) 1000 MG tablet, TAKE 1 TABLET BY MOUTH TWICE DAILY, Disp: 180 tablet, Rfl: 0 .  pioglitazone (ACTOS) 30 MG tablet, TAKE 1 TABLET BY MOUTH DAILY IN THE MORNING, Disp: 90 tablet, Rfl: 0 .  propranolol (INDERAL) 10 MG tablet, Take 1 tablet (10 mg total) by mouth 2 (two) times daily., Disp: 60 tablet, Rfl: 0 .  simvastatin (ZOCOR) 40 MG tablet, TAKE 1 TABLET BY MOUTH NIGHTLY AT BEDTIME, Disp: 90 tablet, Rfl: 1 .  traMADol (ULTRAM) 50 MG tablet, Take 1 tablet (50 mg total) by mouth every 8 (eight) hours as  needed., Disp: 60 tablet, Rfl: 0 .  meloxicam (MOBIC) 15 MG tablet, Take 1 tablet (15 mg total) by mouth daily. (Patient not taking: Reported on 09/18/2015), Disp: 30 tablet, Rfl: 2  No Known Allergies   Review of Systems  Constitutional: Negative for fatigue.  Eyes: Negative for blurred vision.  Respiratory: Negative for shortness of breath.   Cardiovascular: Negative for chest pain and palpitations.  Musculoskeletal: Positive for arthritis and joint swelling. Negative for myalgias.  Neurological: Negative for headaches.  Endo/Heme/Allergies: Negative for polydipsia.      Objective  Vitals:   09/18/15 1459  BP: 115/76  Pulse: 98  Resp: 17  Temp: 98.2 F (36.8 C)  TempSrc: Oral  SpO2: 98%  Weight: 191 lb 6.4 oz (86.8 kg)  Height: 4\' 11"  (1.499 m)    Physical Exam  Constitutional: She is oriented to person, place, and time and well-developed, well-nourished, and in no distress.  HENT:  Head: Normocephalic and atraumatic.  Cardiovascular: Normal rate, regular rhythm and normal heart sounds.   No murmur heard. Pulmonary/Chest: Effort normal and breath sounds normal. She has no wheezes.  Abdominal: Soft. Bowel sounds are normal. There is no tenderness.  Musculoskeletal:       Left knee: She exhibits no swelling. Tenderness found. Medial joint line and patellar tendon tenderness noted.  Neurological: She is alert and oriented to person, place, and time.  Psychiatric: Mood, memory, affect and judgment normal.  Nursing note and vitals reviewed.     Assessment & Plan  1. Type 2 diabetes mellitus without complication, without long-term current use of insulin (HCC) A1c elevated at 8.0%, we will add Januvia to patient's regimen - sitaGLIPtin (JANUVIA) 100 MG tablet; Take 1 tablet (100 mg total) by mouth daily.  Dispense: 90 tablet; Refill: 0 - glyBURIDE (DIABETA) 5 MG tablet; Take 1 tablet (5 mg total) by mouth 2 (two) times daily with a meal.  Dispense: 180 tablet; Refill:  0 - metFORMIN (GLUCOPHAGE) 1000 MG tablet; Take 1 tablet (1,000 mg total) by mouth 2 (two) times daily.  Dispense: 180 tablet; Refill: 0 - pioglitazone (ACTOS) 30 MG tablet; Take 1 tablet (30 mg total) by mouth daily.  Dispense: 90 tablet; Refill: 0 - POCT Glucose (CBG) - POCT HgB A1C  2. Hyperlipidemia  - simvastatin (ZOCOR) 40 MG tablet; TAKE 1 TABLET BY MOUTH NIGHTLY AT BEDTIME  Dispense: 90 tablet; Refill: 1 - Lipid Profile - COMPLETE METABOLIC PANEL WITH GFR  3. Arthritis of left knee  - traMADol (ULTRAM) 50 MG tablet; Take 1 tablet (50 mg total) by mouth every 8 (eight) hours as needed.  Dispense: 90 tablet; Refill: 0  4. Essential hypertension  - benazepril (LOTENSIN) 40 MG tablet; TAKE 1 TABLET (40 MG TOTAL) BY MOUTH DAILY IN THE MORNING  Dispense: 90 tablet; Refill: 0 - bumetanide (BUMEX) 0.5 MG tablet;  Take 1 tablet (0.5 mg total) by mouth 2 (two) times daily.  Dispense: 180 tablet; Refill: 0 - propranolol (INDERAL) 10 MG tablet; Take 1 tablet (10 mg total) by mouth 2 (two) times daily.  Dispense: 180 tablet; Refill: 0   Khair Chasteen Asad A[facility] 09/18/2015 3:49 PM

## 2015-09-25 NOTE — Telephone Encounter (Signed)
COMPLETED

## 2015-10-09 ENCOUNTER — Other Ambulatory Visit: Payer: Self-pay | Admitting: Family Medicine

## 2015-12-19 ENCOUNTER — Ambulatory Visit (INDEPENDENT_AMBULATORY_CARE_PROVIDER_SITE_OTHER): Payer: 59 | Admitting: Family Medicine

## 2015-12-19 ENCOUNTER — Ambulatory Visit
Admission: RE | Admit: 2015-12-19 | Discharge: 2015-12-19 | Disposition: A | Discharge status: Home / Self Care | Payer: 59 | Source: Ambulatory Visit | Attending: Family Medicine | Admitting: Family Medicine

## 2015-12-19 VITALS — BP 120/76 | HR 94 | Temp 98.0°F | Resp 16 | Ht 59.0 in | Wt 189.1 lb

## 2015-12-19 DIAGNOSIS — E785 Hyperlipidemia, unspecified: Secondary | ICD-10-CM

## 2015-12-19 DIAGNOSIS — E119 Type 2 diabetes mellitus without complications: Secondary | ICD-10-CM | POA: Diagnosis not present

## 2015-12-19 DIAGNOSIS — R058 Other specified cough: Secondary | ICD-10-CM

## 2015-12-19 DIAGNOSIS — R05 Cough: Secondary | ICD-10-CM

## 2015-12-19 DIAGNOSIS — J0100 Acute maxillary sinusitis, unspecified: Secondary | ICD-10-CM

## 2015-12-19 DIAGNOSIS — I10 Essential (primary) hypertension: Secondary | ICD-10-CM

## 2015-12-19 LAB — POCT GLYCOSYLATED HEMOGLOBIN (HGB A1C): Hemoglobin A1C: 7.3

## 2015-12-19 MED ORDER — BENAZEPRIL HCL 40 MG PO TABS: ORAL_TABLET | ORAL | 0 refills | Status: DC

## 2015-12-19 MED ORDER — AZITHROMYCIN 250 MG PO TABS: ORAL_TABLET | ORAL | 0 refills | Status: DC

## 2015-12-19 MED ORDER — SITAGLIPTIN PHOSPHATE 100 MG PO TABS: 100.0000 mg | ORAL_TABLET | Freq: Every day | ORAL | 0 refills | Status: DC

## 2015-12-19 MED ORDER — GLYBURIDE 5 MG PO TABS: 5.0000 mg | ORAL_TABLET | Freq: Two times a day (BID) | ORAL | 0 refills | Status: DC

## 2015-12-19 MED ORDER — PROPRANOLOL HCL 10 MG PO TABS: 10.0000 mg | ORAL_TABLET | Freq: Two times a day (BID) | ORAL | 0 refills | Status: DC

## 2015-12-19 MED ORDER — PIOGLITAZONE HCL 30 MG PO TABS: 30.0000 mg | ORAL_TABLET | Freq: Every day | ORAL | 0 refills | Status: DC

## 2015-12-19 MED ORDER — GUAIFENESIN-CODEINE 100-10 MG/5ML PO SYRP: 10.0000 mL | ORAL_SOLUTION | Freq: Three times a day (TID) | ORAL | 0 refills | Status: DC | PRN

## 2015-12-19 MED ORDER — METFORMIN HCL 1000 MG PO TABS: 1000.0000 mg | ORAL_TABLET | Freq: Two times a day (BID) | ORAL | 0 refills | Status: DC

## 2015-12-19 MED ORDER — BUMETANIDE 0.5 MG PO TABS
0.5000 mg | ORAL_TABLET | Freq: Two times a day (BID) | ORAL | 0 refills | Status: DC
Start: 2015-12-19 — End: 2016-03-24

## 2015-12-19 MED ORDER — SIMVASTATIN 40 MG PO TABS: ORAL_TABLET | ORAL | 1 refills | Status: DC

## 2015-12-19 NOTE — Progress Notes (Signed)
Name: Tracey Tucker   MRN: [id number]    DOB: 01/12/1956   Date:12/20/2015       Progress Note  Subjective  Chief Complaint  Chief Complaint  Patient presents with  . Hypertension    3 month follow up, medication refills  . Hyperlipidemia  . Diabetes    Januvia to exspensive, would like change    Hypertension  This is a chronic problem. The problem is unchanged. The problem is controlled. Associated symptoms include headaches. Pertinent negatives include no blurred vision, chest pain, neck pain, palpitations or shortness of breath. Past treatments include beta blockers and ACE inhibitors. There is no history of kidney disease, CAD/MI or CVA.  Hyperlipidemia  This is a chronic problem. The problem is controlled. Recent lipid tests were reviewed and are normal. Pertinent negatives include no chest pain, leg pain, myalgias or shortness of breath. Current antihyperlipidemic treatment includes statins.  Diabetes  She presents for her follow-up diabetic visit. She has type 2 diabetes mellitus. Hypoglycemia symptoms include headaches. Pertinent negatives for diabetes include no blurred vision and no chest pain. Pertinent negatives for diabetic complications include no CVA. Current diabetic treatment includes oral agent (triple therapy). She participates in exercise daily (walks every day). Home blood sugar record trend: does not check her Blood Glucose in AM. An ACE inhibitor/angiotensin II receptor blocker is being taken.  URI   This is a new problem. The current episode started in the past 7 days. There has been no fever (had fever when she started having URI symptoms last week). Associated symptoms include coughing, headaches, rhinorrhea and sinus pain. Pertinent negatives include no chest pain, ear pain, neck pain or sore throat. She has tried decongestant (Mucinex.) for the symptoms.     Past Medical History:  Diagnosis Date  . Diabetes mellitus without complication (HCC)   .  Hypercholesteremia   . Hypertension   . Osteoporosis   . Seasonal allergies   . Wears dentures    full upper, partial lower    Past Surgical History:  Procedure Laterality Date  . CATARACT EXTRACTION Left 05/17/14   [facility]  . CATARACT EXTRACTION W/PHACO Right 07/26/2014   Procedure: CATARACT EXTRACTION PHACO AND INTRAOCULAR LENS PLACEMENT (IOC);  Surgeon: Lockie Molahadwick [place], MD;  [facility];  Service: Ophthalmology;  Laterality: Right;  DIABETIC  . CESAREAN SECTION    . FOOT SURGERY  2014    Family History  Problem Relation Age of Onset  . Diabetes Father   . Diabetes Sister   . Hyperlipidemia Sister   . Hypertension Sister   . Diabetes Brother   . Hypertension Brother     Social History   Social History  . Marital status: Single    Spouse name: N/A  . Number of children: N/A  . Years of education: N/A   Occupational History  . Not on file.   Social History Main Topics  . Smoking status: Current Every Day Smoker    Packs/day: 1.00    Years: 20.00    Types: Cigarettes  . Smokeless tobacco: Never Used  . Alcohol use No  . Drug use: No  . Sexual activity: Not Currently   Other Topics Concern  . Not on file   Social History Narrative  . No narrative on file     Current Outpatient Prescriptions:  .  aspirin 81 MG tablet, Take 81 mg by mouth daily. PM, Disp: , Rfl:  .  benazepril (LOTENSIN) 40 MG tablet, TAKE 1  TABLET (40 MG TOTAL) BY MOUTH DAILY IN THE MORNING, Disp: 90 tablet, Rfl: 0 .  bumetanide (BUMEX) 0.5 MG tablet, Take 1 tablet (0.5 mg total) by mouth 2 (two) times daily., Disp: 180 tablet, Rfl: 0 .  glyBURIDE (DIABETA) 5 MG tablet, Take 1 tablet (5 mg total) by mouth 2 (two) times daily with a meal., Disp: 180 tablet, Rfl: 0 .  latanoprost (XALATAN) 0.005 % ophthalmic solution, 1 drop at bedtime., Disp: , Rfl:  .  metFORMIN (GLUCOPHAGE) 1000 MG tablet, Take 1 tablet (1,000 mg total) by mouth 2 (two) times daily., Disp: 180  tablet, Rfl: 0 .  pioglitazone (ACTOS) 30 MG tablet, Take 1 tablet (30 mg total) by mouth daily., Disp: 90 tablet, Rfl: 0 .  propranolol (INDERAL) 10 MG tablet, Take 1 tablet (10 mg total) by mouth 2 (two) times daily., Disp: 180 tablet, Rfl: 0 .  simvastatin (ZOCOR) 40 MG tablet, TAKE 1 TABLET BY MOUTH NIGHTLY AT BEDTIME, Disp: 90 tablet, Rfl: 1 .  sitaGLIPtin (JANUVIA) 100 MG tablet, Take 1 tablet (100 mg total) by mouth daily., Disp: 90 tablet, Rfl: 0 .  traMADol (ULTRAM) 50 MG tablet, Take 1 tablet (50 mg total) by mouth every 8 (eight) hours as needed., Disp: 90 tablet, Rfl: 0 .  azithromycin (ZITHROMAX) 250 MG tablet, 2 tabs po day 1, then 1 tab po q day x 4 days, Disp: 6 tablet, Rfl: 0 .  guaiFENesin-codeine (CHERATUSSIN AC) 100-10 MG/5ML syrup, Take 10 mLs by mouth 3 (three) times daily as needed for cough., Disp: 210 mL, Rfl: 0  No Known Allergies   Review of Systems  HENT: Positive for rhinorrhea and sinus pain. Negative for ear pain and sore throat.   Eyes: Negative for blurred vision.  Respiratory: Positive for cough. Negative for shortness of breath.   Cardiovascular: Negative for chest pain and palpitations.  Musculoskeletal: Negative for myalgias and neck pain.  Neurological: Positive for headaches.    Objective  Vitals:   12/20/15 1236  BP: 120/76  Pulse: 94  Resp: 16  Temp: 98 F (36.7 C)  TempSrc: Oral  SpO2: 96%  Weight: 189 lb 1.6 oz (85.8 kg)  Height: 4\' 11"  (1.499 m)    Physical Exam  Constitutional: She is oriented to person, place, and time and well-developed, well-nourished, and in no distress.  HENT:  Nose: Right sinus exhibits maxillary sinus tenderness. Left sinus exhibits maxillary sinus tenderness.  Mouth/Throat: Posterior oropharyngeal erythema present.  Cardiovascular: Normal rate, regular rhythm, S1 normal, S2 normal and normal heart sounds.   No murmur heard. Pulmonary/Chest: Effort normal. No respiratory distress. She has no decreased  breath sounds. She has wheezes in the right upper field. She has rhonchi in the right lower field.  Abdominal: Soft. Bowel sounds are normal. There is no tenderness.  Neurological: She is alert and oriented to person, place, and time.  Psychiatric: Mood, memory, affect and judgment normal.  Nursing note and vitals reviewed.    Recent Results (from the past 2160 hour(s))  POCT HgB A1C     Status: None   Collection Time: 12/19/15  8:59 AM  Result Value Ref Range   Hemoglobin A1C 7.3     Assessment & Plan  1. Type 2 diabetes mellitus without complication, without long-term current use of insulin (HCC) POC A1c at goal at 7.3%, continue on present pharmacotherapy.  - POCT HgB A1C - sitaGLIPtin (JANUVIA) 100 MG tablet; Take 1 tablet (100 mg total) by mouth daily.  Dispense:  90 tablet; Refill: 0 - pioglitazone (ACTOS) 30 MG tablet; Take 1 tablet (30 mg total) by mouth daily.  Dispense: 90 tablet; Refill: 0 - metFORMIN (GLUCOPHAGE) 1000 MG tablet; Take 1 tablet (1,000 mg total) by mouth 2 (two) times daily.  Dispense: 180 tablet; Refill: 0 - glyBURIDE (DIABETA) 5 MG tablet; Take 1 tablet (5 mg total) by mouth 2 (two) times daily with a meal.  Dispense: 180 tablet; Refill: 0  2. Essential hypertension BP stable and controlled on present therapy. - propranolol (INDERAL) 10 MG tablet; Take 1 tablet (10 mg total) by mouth 2 (two) times daily.  Dispense: 180 tablet; Refill: 0 - bumetanide (BUMEX) 0.5 MG tablet; Take 1 tablet (0.5 mg total) by mouth 2 (two) times daily.  Dispense: 180 tablet; Refill: 0 - benazepril (LOTENSIN) 40 MG tablet; TAKE 1 TABLET (40 MG TOTAL) BY MOUTH DAILY IN THE MORNING  Dispense: 90 tablet; Refill: 0  3. Hyperlipidemia, unspecified hyperlipidemia type  - simvastatin (ZOCOR) 40 MG tablet; TAKE 1 TABLET BY MOUTH NIGHTLY AT BEDTIME  Dispense: 90 tablet; Refill: 1  4. Acute non-recurrent maxillary sinusitis  - azithromycin (ZITHROMAX) 250 MG tablet; 2 tabs po day 1,  then 1 tab po q day x 4 days  Dispense: 6 tablet; Refill: 0  5. Productive cough  - DG Chest 2 View; Future - guaiFENesin-codeine (CHERATUSSIN AC) 100-10 MG/5ML syrup; Take 10 mLs by mouth 3 (three) times daily as needed for cough.  Dispense: 210 mL; Refill: 0   Kyira Volkert Asad A. Faylene KurtzShah [facility] 12/20/2015 12:45 PM

## 2015-12-20 ENCOUNTER — Encounter: Payer: Self-pay | Admitting: Family Medicine

## 2016-01-31 ENCOUNTER — Encounter: Payer: 59 | Admitting: Family Medicine

## 2016-03-11 ENCOUNTER — Other Ambulatory Visit: Payer: Self-pay | Admitting: Family Medicine

## 2016-03-11 ENCOUNTER — Ambulatory Visit (INDEPENDENT_AMBULATORY_CARE_PROVIDER_SITE_OTHER): Payer: 59 | Admitting: Family Medicine

## 2016-03-11 ENCOUNTER — Encounter: Payer: Self-pay | Admitting: Family Medicine

## 2016-03-11 VITALS — BP 123/73 | HR 91 | Temp 98.0°F | Resp 17 | Ht 59.0 in | Wt 191.2 lb

## 2016-03-11 DIAGNOSIS — Z01419 Encounter for gynecological examination (general) (routine) without abnormal findings: Secondary | ICD-10-CM | POA: Diagnosis not present

## 2016-03-11 DIAGNOSIS — Z1211 Encounter for screening for malignant neoplasm of colon: Secondary | ICD-10-CM | POA: Diagnosis not present

## 2016-03-11 NOTE — Progress Notes (Signed)
Name: Tracey Tucker   MRN: [id number]    DOB: 02-14-1955   Date:03/11/2016       Progress Note  Subjective  Chief Complaint  Chief Complaint  Patient presents with  . Annual Exam    CPE    HPI  Pt. Presents for annual physical exam. Mammogram was completed in 2015,  DEXA scan completed in 2016. Has not had Pap smear in over 2 years.  Colonoscopy was completed 10 years ago.   Past Medical History:  Diagnosis Date  . Diabetes mellitus without complication (HCC)   . Hypercholesteremia   . Hypertension   . Osteoporosis   . Seasonal allergies   . Wears dentures    full upper, partial lower    Past Surgical History:  Procedure Laterality Date  . CATARACT EXTRACTION Left 05/17/14   [facility]  . CATARACT EXTRACTION W/PHACO Right 07/26/2014   Procedure: CATARACT EXTRACTION PHACO AND INTRAOCULAR LENS PLACEMENT (IOC);  Surgeon: Lockie Mola, MD;  Location: [facility];  Service: Ophthalmology;  Laterality: Right;  DIABETIC  . CESAREAN SECTION    . FOOT SURGERY  2014    Family History  Problem Relation Age of Onset  . Diabetes Father   . Diabetes Sister   . Hyperlipidemia Sister   . Hypertension Sister   . Diabetes Brother   . Hypertension Brother     Social History   Social History  . Marital status: Single    Spouse name: N/A  . Number of children: N/A  . Years of education: N/A   Occupational History  . Not on file.   Social History Main Topics  . Smoking status: Current Every Day Smoker    Packs/day: 1.00    Years: 20.00    Types: Cigarettes  . Smokeless tobacco: Never Used  . Alcohol use No  . Drug use: No  . Sexual activity: Not Currently   Other Topics Concern  . Not on file   Social History Narrative  . No narrative on file     Current Outpatient Prescriptions:  .  aspirin 81 MG tablet, Take 81 mg by mouth daily. PM, Disp: , Rfl:  .  benazepril (LOTENSIN) 40 MG tablet, TAKE 1 TABLET (40 MG TOTAL) BY MOUTH DAILY IN THE  MORNING, Disp: 90 tablet, Rfl: 0 .  bumetanide (BUMEX) 0.5 MG tablet, Take 1 tablet (0.5 mg total) by mouth 2 (two) times daily., Disp: 180 tablet, Rfl: 0 .  glyBURIDE (DIABETA) 5 MG tablet, Take 1 tablet (5 mg total) by mouth 2 (two) times daily with a meal., Disp: 180 tablet, Rfl: 0 .  latanoprost (XALATAN) 0.005 % ophthalmic solution, 1 drop at bedtime., Disp: , Rfl:  .  metFORMIN (GLUCOPHAGE) 1000 MG tablet, Take 1 tablet (1,000 mg total) by mouth 2 (two) times daily., Disp: 180 tablet, Rfl: 0 .  pioglitazone (ACTOS) 30 MG tablet, Take 1 tablet (30 mg total) by mouth daily., Disp: 90 tablet, Rfl: 0 .  propranolol (INDERAL) 10 MG tablet, Take 1 tablet (10 mg total) by mouth 2 (two) times daily., Disp: 180 tablet, Rfl: 0 .  simvastatin (ZOCOR) 40 MG tablet, TAKE 1 TABLET BY MOUTH NIGHTLY AT BEDTIME, Disp: 90 tablet, Rfl: 1 .  sitaGLIPtin (JANUVIA) 100 MG tablet, Take 1 tablet (100 mg total) by mouth daily., Disp: 90 tablet, Rfl: 0 .  traMADol (ULTRAM) 50 MG tablet, Take 1 tablet (50 mg total) by mouth every 8 (eight) hours as needed., Disp: 90 tablet, Rfl:  0 .  azithromycin (ZITHROMAX) 250 MG tablet, 2 tabs po day 1, then 1 tab po q day x 4 days (Patient not taking: Reported on 03/11/2016), Disp: 6 tablet, Rfl: 0 .  guaiFENesin-codeine (CHERATUSSIN AC) 100-10 MG/5ML syrup, Take 10 mLs by mouth 3 (three) times daily as needed for cough. (Patient not taking: Reported on 03/11/2016), Disp: 210 mL, Rfl: 0  No Known Allergies   Review of Systems  Constitutional: Negative for chills, fever, malaise/fatigue and weight loss.  HENT: Positive for congestion and sinus pain. Negative for sore throat.   Eyes: Negative for blurred vision and double vision.  Respiratory: Positive for cough and wheezing. Negative for sputum production and shortness of breath.   Cardiovascular: Negative for chest pain and leg swelling.  Gastrointestinal: Negative for blood in stool, constipation, diarrhea, nausea and vomiting.   Genitourinary: Negative for dysuria, hematuria and urgency.  Musculoskeletal: Negative for back pain, joint pain, myalgias and neck pain.  Neurological: Negative for dizziness and headaches.  Psychiatric/Behavioral: Negative for depression. The patient is not nervous/anxious and does not have insomnia.      Objective  Vitals:   03/11/16 1111  BP: 123/73  Pulse: 91  Resp: 17  Temp: 98 F (36.7 C)  TempSrc: Oral  SpO2: 96%  Weight: 191 lb 3.2 oz (86.7 kg)  Height: 4\' 11"  (1.499 m)    Physical Exam  Constitutional: She is oriented to person, place, and time and well-developed, well-nourished, and in no distress.  HENT:  Head: Normocephalic and atraumatic.  Right Ear: Tympanic membrane and ear canal normal. No drainage or swelling.  Left Ear: Tympanic membrane and ear canal normal. No drainage or swelling.  Mouth/Throat: Posterior oropharyngeal erythema present.  L. nasal turbinate hypertrophy   Cardiovascular: Normal rate, regular rhythm, S1 normal, S2 normal and normal heart sounds.   No murmur heard. Pulmonary/Chest: Effort normal and breath sounds normal. She has no wheezes. She has no rhonchi. Right breast exhibits no mass, no nipple discharge and no skin change. Left breast exhibits no mass, no nipple discharge and no skin change.  Abdominal: Soft. Bowel sounds are normal. There is no tenderness.  Genitourinary: Vagina normal, uterus normal and cervix normal. Cervix exhibits no motion tenderness and no lesion.  Musculoskeletal:       Right ankle: She exhibits no swelling.       Left ankle: She exhibits no swelling.  Neurological: She is alert and oriented to person, place, and time.  Skin: Skin is warm, dry and intact.  Psychiatric: Mood, memory, affect and judgment normal.  Nursing note and vitals reviewed.    Assessment & Plan  1. Well woman exam with routine gynecological exam  - Pap IG and HPV (high risk) DNA detection - MM Digital Screening; Future - DG  Bone Density; Future - CBC with Differential - Lipid Profile - Comprehensive Metabolic Panel (CMET) - TSH - Vitamin D (25 hydroxy)  2. Colon cancer screening  - Cologuard   Laurier Jasperson Asad A. Faylene KurtzShah [facility] 03/11/2016 11:44 AM

## 2016-03-13 LAB — PAP IG AND HPV HIGH-RISK: HPV DNA High Risk: NOT DETECTED

## 2016-03-24 ENCOUNTER — Other Ambulatory Visit: Payer: Self-pay | Admitting: Family Medicine

## 2016-03-24 ENCOUNTER — Other Ambulatory Visit: Payer: Self-pay | Admitting: Emergency Medicine

## 2016-03-24 DIAGNOSIS — E119 Type 2 diabetes mellitus without complications: Secondary | ICD-10-CM

## 2016-03-24 DIAGNOSIS — I10 Essential (primary) hypertension: Secondary | ICD-10-CM

## 2016-03-24 MED ORDER — BUMETANIDE 0.5 MG PO TABS: 0.5000 mg | ORAL_TABLET | Freq: Two times a day (BID) | ORAL | 0 refills | Status: DC

## 2016-03-24 MED ORDER — PROPRANOLOL HCL 10 MG PO TABS: 10.0000 mg | ORAL_TABLET | Freq: Two times a day (BID) | ORAL | 0 refills | Status: DC

## 2016-03-24 MED ORDER — METFORMIN HCL 1000 MG PO TABS: 1000.0000 mg | ORAL_TABLET | Freq: Two times a day (BID) | ORAL | 0 refills | Status: DC

## 2016-03-24 MED ORDER — BENAZEPRIL HCL 40 MG PO TABS: ORAL_TABLET | ORAL | 0 refills | Status: DC

## 2016-03-24 MED ORDER — GLYBURIDE 5 MG PO TABS: 5.0000 mg | ORAL_TABLET | Freq: Two times a day (BID) | ORAL | 0 refills | Status: DC

## 2016-03-24 MED ORDER — SITAGLIPTIN PHOSPHATE 100 MG PO TABS: 100.0000 mg | ORAL_TABLET | Freq: Every day | ORAL | 0 refills | Status: DC

## 2016-03-24 MED ORDER — PIOGLITAZONE HCL 30 MG PO TABS: 30.0000 mg | ORAL_TABLET | Freq: Every day | ORAL | 0 refills | Status: DC

## 2016-04-07 ENCOUNTER — Other Ambulatory Visit: Payer: Self-pay | Admitting: Family Medicine

## 2016-04-07 DIAGNOSIS — Z01419 Encounter for gynecological examination (general) (routine) without abnormal findings: Secondary | ICD-10-CM | POA: Diagnosis not present

## 2016-04-07 LAB — LIPID PANEL
Cholesterol: 145 mg/dL (ref <200)
HDL: 53 mg/dL (ref >50)
LDL CALC: 80 mg/dL (ref <100)
TRIGLYCERIDES: 62 mg/dL (ref <150)
Total CHOL/HDL Ratio: 2.7 Ratio (ref <5.0)
VLDL: 12 mg/dL (ref <30)

## 2016-04-07 LAB — COMPREHENSIVE METABOLIC PANEL WITH GFR
ALT: 12 U/L (ref 6–29)
AST: 13 U/L (ref 10–35)
Albumin: 3.9 g/dL (ref 3.6–5.1)
Alkaline Phosphatase: 55 U/L (ref 33–130)
BUN: 15 mg/dL (ref 7–25)
CO2: 29 mmol/L (ref 20–31)
Calcium: 9.3 mg/dL (ref 8.6–10.4)
Chloride: 106 mmol/L (ref 98–110)
Creat: 0.85 mg/dL (ref 0.50–0.99)
Glucose, Bld: 118 mg/dL — ABNORMAL HIGH (ref 65–99)
Potassium: 4.2 mmol/L (ref 3.5–5.3)
Sodium: 139 mmol/L (ref 135–146)
Total Bilirubin: 0.3 mg/dL (ref 0.2–1.2)
Total Protein: 6.2 g/dL (ref 6.1–8.1)

## 2016-04-07 LAB — CBC WITH DIFFERENTIAL/PLATELET
Basophils Absolute: 0 cells/uL (ref 0–200)
Basophils Relative: 0 %
EOS ABS: 70 {cells}/uL (ref 15–500)
Eosinophils Relative: 1 %
HCT: 39.5 % (ref 35.0–45.0)
Hemoglobin: 12.6 g/dL (ref 11.7–15.5)
Lymphocytes Relative: 30 %
Lymphs Abs: 2100 cells/uL (ref 850–3900)
MCH: 27.8 pg (ref 27.0–33.0)
MCHC: 31.9 g/dL — AB (ref 32.0–36.0)
MCV: 87.2 fL (ref 80.0–100.0)
MONO ABS: 490 {cells}/uL (ref 200–950)
MONOS PCT: 7 %
MPV: 10.4 fL (ref 7.5–12.5)
NEUTROS ABS: 4340 {cells}/uL (ref 1500–7800)
Neutrophils Relative %: 62 %
Platelets: 230 10*3/uL (ref 140–400)
RBC: 4.53 MIL/uL (ref 3.80–5.10)
RDW: 14.5 % (ref 11.0–15.0)
WBC: 7 10*3/uL (ref 3.8–10.8)

## 2016-04-07 LAB — TSH: TSH: 1.58 mIU/L

## 2016-04-08 LAB — VITAMIN D 25 HYDROXY (VIT D DEFICIENCY, FRACTURES): Vit D, 25-Hydroxy: 19 ng/mL — ABNORMAL LOW (ref 30–100)

## 2016-04-15 ENCOUNTER — Other Ambulatory Visit: Payer: Self-pay | Admitting: Emergency Medicine

## 2016-04-15 MED ORDER — VITAMIN D (ERGOCALCIFEROL) 1.25 MG (50000 UNIT) PO CAPS: 50000.0000 [IU] | ORAL_CAPSULE | ORAL | 0 refills | Status: DC

## 2016-04-15 NOTE — Progress Notes (Unsigned)
vitamin

## 2016-06-02 ENCOUNTER — Other Ambulatory Visit: Payer: Self-pay | Admitting: Family Medicine

## 2016-06-02 ENCOUNTER — Telehealth: Payer: Self-pay | Admitting: Family Medicine

## 2016-06-02 DIAGNOSIS — I10 Essential (primary) hypertension: Secondary | ICD-10-CM

## 2016-06-02 NOTE — Telephone Encounter (Signed)
Pt needs refills on Bumetanide to be sent to [facility] pharmacy. Pt has an appt 06/11/16

## 2016-06-04 ENCOUNTER — Other Ambulatory Visit: Payer: Self-pay | Admitting: Emergency Medicine

## 2016-06-04 DIAGNOSIS — I10 Essential (primary) hypertension: Secondary | ICD-10-CM

## 2016-06-04 MED ORDER — BUMETANIDE 0.5 MG PO TABS: 0.5000 mg | ORAL_TABLET | Freq: Two times a day (BID) | ORAL | 0 refills | Status: DC

## 2016-06-04 NOTE — Telephone Encounter (Signed)
Script sent  

## 2016-06-11 ENCOUNTER — Encounter: Payer: Self-pay | Admitting: Family Medicine

## 2016-06-11 ENCOUNTER — Ambulatory Visit (INDEPENDENT_AMBULATORY_CARE_PROVIDER_SITE_OTHER): Payer: 59 | Admitting: Family Medicine

## 2016-06-11 VITALS — BP 125/72 | HR 102 | Temp 98.0°F | Resp 17 | Ht 59.0 in | Wt 191.7 lb

## 2016-06-11 DIAGNOSIS — E119 Type 2 diabetes mellitus without complications: Secondary | ICD-10-CM | POA: Diagnosis not present

## 2016-06-11 DIAGNOSIS — M1712 Unilateral primary osteoarthritis, left knee: Secondary | ICD-10-CM

## 2016-06-11 DIAGNOSIS — I10 Essential (primary) hypertension: Secondary | ICD-10-CM

## 2016-06-11 DIAGNOSIS — E785 Hyperlipidemia, unspecified: Secondary | ICD-10-CM

## 2016-06-11 LAB — POCT GLYCOSYLATED HEMOGLOBIN (HGB A1C): Hemoglobin A1C: 7.5

## 2016-06-11 MED ORDER — PROPRANOLOL HCL 10 MG PO TABS: 10.0000 mg | ORAL_TABLET | Freq: Two times a day (BID) | ORAL | 0 refills | Status: DC

## 2016-06-11 MED ORDER — PIOGLITAZONE HCL 30 MG PO TABS: 30.0000 mg | ORAL_TABLET | Freq: Every day | ORAL | 0 refills | Status: DC

## 2016-06-11 MED ORDER — TRAMADOL HCL 50 MG PO TABS: 50.0000 mg | ORAL_TABLET | Freq: Every day | ORAL | 0 refills | Status: DC | PRN

## 2016-06-11 MED ORDER — SITAGLIPTIN PHOSPHATE 100 MG PO TABS: 100.0000 mg | ORAL_TABLET | Freq: Every day | ORAL | 0 refills | Status: DC

## 2016-06-11 MED ORDER — SIMVASTATIN 40 MG PO TABS: ORAL_TABLET | ORAL | 1 refills | Status: DC

## 2016-06-11 MED ORDER — METFORMIN HCL 1000 MG PO TABS: 1000.0000 mg | ORAL_TABLET | Freq: Two times a day (BID) | ORAL | 0 refills | Status: DC

## 2016-06-11 MED ORDER — BENAZEPRIL HCL 40 MG PO TABS: ORAL_TABLET | ORAL | 0 refills | Status: DC

## 2016-06-11 NOTE — Progress Notes (Signed)
Name: Tracey Tucker   MRN: [id number]    DOB: 10/07/1955   Date:06/11/2016       Progress Note  Subjective  Chief Complaint  Chief Complaint  Patient presents with  . Follow-up    1 mo    Diabetes  She presents for her follow-up diabetic visit. She has type 2 diabetes mellitus. Her disease course has been worsening. There are no hypoglycemic associated symptoms. Pertinent negatives for hypoglycemia include no headaches. Pertinent negatives for diabetes include no blurred vision, no chest pain, no foot paresthesias, no polydipsia and no polyuria. Current diabetic treatment includes oral agent (triple therapy). She is following a diabetic diet. Home blood sugar record trend: Pt. does not check her blood glucose at home.  Hypertension  This is a chronic problem. The problem is unchanged. Pertinent negatives include no blurred vision, chest pain, headaches, palpitations or shortness of breath. Past treatments include ACE inhibitors and beta blockers.  Hyperlipidemia  This is a chronic problem. The problem is controlled. Recent lipid tests were reviewed and are normal. Pertinent negatives include no chest pain, leg pain, myalgias or shortness of breath. Current antihyperlipidemic treatment includes statins.     Past Medical History:  Diagnosis Date  . Diabetes mellitus without complication (HCC)   . Hypercholesteremia   . Hypertension   . Osteoporosis   . Seasonal allergies   . Wears dentures    full upper, partial lower    Past Surgical History:  Procedure Laterality Date  . CATARACT EXTRACTION Left 05/17/14   [facility]  . CATARACT EXTRACTION W/PHACO Right 07/26/2014   Procedure: CATARACT EXTRACTION PHACO AND INTRAOCULAR LENS PLACEMENT (IOC);  Surgeon: Lockie Mola, MD;  Location: [facility];  Service: Ophthalmology;  Laterality: Right;  DIABETIC  . CESAREAN SECTION    . FOOT SURGERY  2014    Family History  Problem Relation Age of Onset  . Diabetes Father    . Diabetes Sister   . Hyperlipidemia Sister   . Hypertension Sister   . Diabetes Brother   . Hypertension Brother     Social History   Social History  . Marital status: Single    Spouse name: N/A  . Number of children: N/A  . Years of education: N/A   Occupational History  . Not on file.   Social History Main Topics  . Smoking status: Current Every Day Smoker    Packs/day: 1.00    Years: 20.00    Types: Cigarettes  . Smokeless tobacco: Never Used  . Alcohol use No  . Drug use: No  . Sexual activity: Not Currently   Other Topics Concern  . Not on file   Social History Narrative  . No narrative on file     Current Outpatient Prescriptions:  .  aspirin 81 MG tablet, Take 81 mg by mouth daily. PM, Disp: , Rfl:  .  benazepril (LOTENSIN) 40 MG tablet, TAKE 1 TABLET (40 MG TOTAL) BY MOUTH DAILY IN THE MORNING, Disp: 90 tablet, Rfl: 0 .  bumetanide (BUMEX) 0.5 MG tablet, Take 1 tablet (0.5 mg total) by mouth 2 (two) times daily., Disp: 180 tablet, Rfl: 0 .  glyBURIDE (DIABETA) 5 MG tablet, Take 1 tablet (5 mg total) by mouth 2 (two) times daily with a meal., Disp: 180 tablet, Rfl: 0 .  latanoprost (XALATAN) 0.005 % ophthalmic solution, 1 drop at bedtime., Disp: , Rfl:  .  metFORMIN (GLUCOPHAGE) 1000 MG tablet, Take 1 tablet (1,000 mg total) by mouth  2 (two) times daily., Disp: 180 tablet, Rfl: 0 .  pioglitazone (ACTOS) 30 MG tablet, Take 1 tablet (30 mg total) by mouth daily., Disp: 90 tablet, Rfl: 0 .  propranolol (INDERAL) 10 MG tablet, Take 1 tablet (10 mg total) by mouth 2 (two) times daily., Disp: 180 tablet, Rfl: 0 .  simvastatin (ZOCOR) 40 MG tablet, TAKE 1 TABLET BY MOUTH NIGHTLY AT BEDTIME, Disp: 90 tablet, Rfl: 1 .  sitaGLIPtin (JANUVIA) 100 MG tablet, Take 1 tablet (100 mg total) by mouth daily., Disp: 90 tablet, Rfl: 0 .  traMADol (ULTRAM) 50 MG tablet, Take 1 tablet (50 mg total) by mouth every 8 (eight) hours as needed., Disp: 90 tablet, Rfl: 0 .  Vitamin D,  Ergocalciferol, (DRISDOL) 50000 units CAPS capsule, Take 1 capsule (50,000 Units total) by mouth every 7 (seven) days., Disp: 12 capsule, Rfl: 0 .  azithromycin (ZITHROMAX) 250 MG tablet, 2 tabs po day 1, then 1 tab po q day x 4 days (Patient not taking: Reported on 03/11/2016), Disp: 6 tablet, Rfl: 0 .  guaiFENesin-codeine (CHERATUSSIN AC) 100-10 MG/5ML syrup, Take 10 mLs by mouth 3 (three) times daily as needed for cough. (Patient not taking: Reported on 03/11/2016), Disp: 210 mL, Rfl: 0  No Known Allergies   Review of Systems  Eyes: Negative for blurred vision.  Respiratory: Negative for shortness of breath.   Cardiovascular: Negative for chest pain and palpitations.  Musculoskeletal: Negative for myalgias.  Neurological: Negative for headaches.  Endo/Heme/Allergies: Negative for polydipsia.     Objective  Vitals:   06/11/16 1121  BP: 125/72  Pulse: (!) 102  Resp: 17  Temp: 98 F (36.7 C)  TempSrc: Oral  SpO2: 97%  Weight: 191 lb 11.2 oz (87 kg)  Height:  (1.499 m)    Physical Exam  Constitutional: She is oriented to person, place, and time and well-developed, well-nourished, and in no distress.  HENT:  Head: Normocephalic and atraumatic.  Cardiovascular: Normal rate, regular rhythm and normal heart sounds.   No murmur heard. Pulmonary/Chest: Effort normal and breath sounds normal. She has no wheezes.  Abdominal: Soft. Bowel sounds are normal. There is no tenderness.  Neurological: She is alert and oriented to person, place, and time.  Nursing note and vitals reviewed.    Assessment & Plan  1. Type 2 diabetes mellitus without complication, without long-term current use of insulin (HCC)   A1c 7.5%, and acceptable control, no change in pharmacotherapy - POCT HgB A1C - metFORMIN (GLUCOPHAGE) 1000 MG tablet; Take 1 tablet (1,000 mg total) by mouth 2 (two) times daily.  Dispense: 180 tablet; Refill: 0 - pioglitazone (ACTOS) 30 MG tablet; Take 1 tablet (30 mg  total) by mouth daily.  Dispense: 90 tablet; Refill: 0 - sitaGLIPtin (JANUVIA) 100 MG tablet; Take 1 tablet (100 mg total) by mouth daily.  Dispense: 90 tablet; Refill: 0  2. Essential hypertension BP stable on present antihypertensive therapy - benazepril (LOTENSIN) 40 MG tablet; TAKE 1 TABLET (40 MG TOTAL) BY MOUTH DAILY IN THE MORNING  Dispense: 90 tablet; Refill: 0 - propranolol (INDERAL) 10 MG tablet; Take 1 tablet (10 mg total) by mouth 2 (two) times daily.  Dispense: 180 tablet; Refill: 0  3. Hyperlipidemia, unspecified hyperlipidemia type  - simvastatin (ZOCOR) 40 MG tablet; TAKE 1 TABLET BY MOUTH NIGHTLY AT BEDTIME  Dispense: 90 tablet; Refill: 1   Shanina Kepple [facility]ad [facility] 06/11/2016 11:37 AM

## 2016-06-23 ENCOUNTER — Other Ambulatory Visit: Payer: Self-pay | Admitting: Family Medicine

## 2016-06-23 DIAGNOSIS — E119 Type 2 diabetes mellitus without complications: Secondary | ICD-10-CM

## 2016-06-24 ENCOUNTER — Other Ambulatory Visit: Payer: Self-pay | Admitting: Emergency Medicine

## 2016-06-24 DIAGNOSIS — E119 Type 2 diabetes mellitus without complications: Secondary | ICD-10-CM

## 2016-06-24 MED ORDER — GLYBURIDE 5 MG PO TABS: 5.0000 mg | ORAL_TABLET | Freq: Two times a day (BID) | ORAL | 0 refills | Status: DC

## 2016-09-08 ENCOUNTER — Other Ambulatory Visit: Payer: Self-pay | Admitting: Family Medicine

## 2016-09-08 DIAGNOSIS — I10 Essential (primary) hypertension: Secondary | ICD-10-CM

## 2016-09-11 ENCOUNTER — Encounter: Payer: Self-pay | Admitting: Family Medicine

## 2016-09-11 ENCOUNTER — Ambulatory Visit (INDEPENDENT_AMBULATORY_CARE_PROVIDER_SITE_OTHER): Payer: 59 | Admitting: Family Medicine

## 2016-09-11 VITALS — BP 128/68 | HR 89 | Temp 97.8°F | Resp 16 | Ht 59.0 in | Wt 191.3 lb

## 2016-09-11 DIAGNOSIS — E7800 Pure hypercholesterolemia, unspecified: Secondary | ICD-10-CM

## 2016-09-11 DIAGNOSIS — E119 Type 2 diabetes mellitus without complications: Secondary | ICD-10-CM

## 2016-09-11 DIAGNOSIS — I10 Essential (primary) hypertension: Secondary | ICD-10-CM

## 2016-09-11 LAB — POCT GLYCOSYLATED HEMOGLOBIN (HGB A1C): Hemoglobin A1C: 7.1

## 2016-09-11 LAB — GLUCOSE, POCT (MANUAL RESULT ENTRY): POC GLUCOSE: 145 mg/dL — AB (ref 70–99)

## 2016-09-11 MED ORDER — PIOGLITAZONE HCL 30 MG PO TABS: 30.0000 mg | ORAL_TABLET | Freq: Every day | ORAL | 0 refills | Status: DC

## 2016-09-11 MED ORDER — GLYBURIDE 5 MG PO TABS: 5.0000 mg | ORAL_TABLET | Freq: Two times a day (BID) | ORAL | 0 refills | Status: DC

## 2016-09-11 NOTE — Progress Notes (Signed)
Name: Tracey Tucker   MRN: [id number]    DOB: 05/14/1955   Date:09/11/2016       Progress Note  Subjective  Chief Complaint  Chief Complaint  Patient presents with  . Follow-up    3 mo  . Diabetes  . Hyperlipidemia    Diabetes  She presents for her follow-up diabetic visit. She has type 2 diabetes mellitus. Her disease course has been stable. There are no hypoglycemic associated symptoms. Pertinent negatives for hypoglycemia include no headaches or sweats. Pertinent negatives for diabetes include no blurred vision, no fatigue, no foot paresthesias, no polydipsia and no polyuria. Pertinent negatives for diabetic complications include no CVA, heart disease or peripheral neuropathy. Current diabetic treatment includes oral agent (triple therapy) (Januvia, Metformin, Glyburide, and Actos.). She is following a diabetic diet. Frequency home blood tests: does not check blood glucose at home. An ACE inhibitor/angiotensin II receptor blocker is being taken. Eye exam is current.  Hyperlipidemia  This is a chronic problem. The problem is controlled. Recent lipid tests were reviewed and are normal. Pertinent negatives include no leg pain, myalgias or shortness of breath. Current antihyperlipidemic treatment includes statins.  Hypertension  This is a chronic problem. The problem is unchanged. The problem is controlled. Pertinent negatives include no blurred vision, headaches, orthopnea, palpitations, shortness of breath or sweats. Past treatments include ACE inhibitors, diuretics and beta blockers. There is no history of CVA.     Past Medical History:  Diagnosis Date  . Diabetes mellitus without complication (HCC)   . Hypercholesteremia   . Hypertension   . Osteoporosis   . Seasonal allergies   . Wears dentures    full upper, partial lower    Past Surgical History:  Procedure Laterality Date  . CATARACT EXTRACTION Left 05/17/14   [facility]  . CATARACT EXTRACTION W/PHACO Right 07/26/2014    Procedure: CATARACT EXTRACTION PHACO AND INTRAOCULAR LENS PLACEMENT (IOC);  Surgeon: Lockie Molahadwick [place], MD;  [facility];  Service: Ophthalmology;  Laterality: Right;  DIABETIC  . CESAREAN SECTION    . FOOT SURGERY  2014    Family History  Problem Relation Age of Onset  . Diabetes Father   . Diabetes Sister   . Hyperlipidemia Sister   . Hypertension Sister   . Diabetes Brother   . Hypertension Brother     Social History   Social History  . Marital status: Single    Spouse name: N/A  . Number of children: N/A  . Years of education: N/A   Occupational History  . Not on file.   Social History Main Topics  . Smoking status: Current Every Day Smoker    Packs/day: 1.00    Years: 20.00    Types: Cigarettes  . Smokeless tobacco: Never Used  . Alcohol use No  . Drug use: No  . Sexual activity: Not Currently   Other Topics Concern  . Not on file   Social History Narrative  . No narrative on file     Current Outpatient Prescriptions:  .  aspirin 81 MG tablet, Take 81 mg by mouth daily. PM, Disp: , Rfl:  .  benazepril (LOTENSIN) 40 MG tablet, TAKE 1 TABLET (40 MG TOTAL) BY MOUTH DAILY IN THE MORNING, Disp: 90 tablet, Rfl: 0 .  bumetanide (BUMEX) 0.5 MG tablet, TAKE 1 TABLET BY MOUTH TWICE A DAY, Disp: 180 tablet, Rfl: 0 .  glyBURIDE (DIABETA) 5 MG tablet, Take 1 tablet (5 mg total) by mouth 2 (two) times  daily with a meal., Disp: 180 tablet, Rfl: 0 .  guaiFENesin-codeine (CHERATUSSIN AC) 100-10 MG/5ML syrup, Take 10 mLs by mouth 3 (three) times daily as needed for cough., Disp: 210 mL, Rfl: 0 .  latanoprost (XALATAN) 0.005 % ophthalmic solution, 1 drop at bedtime., Disp: , Rfl:  .  metFORMIN (GLUCOPHAGE) 1000 MG tablet, Take 1 tablet (1,000 mg total) by mouth 2 (two) times daily., Disp: 180 tablet, Rfl: 0 .  pioglitazone (ACTOS) 30 MG tablet, Take 1 tablet (30 mg total) by mouth daily., Disp: 90 tablet, Rfl: 0 .  propranolol (INDERAL) 10 MG tablet, Take  1 tablet (10 mg total) by mouth 2 (two) times daily., Disp: 180 tablet, Rfl: 0 .  simvastatin (ZOCOR) 40 MG tablet, TAKE 1 TABLET BY MOUTH NIGHTLY AT BEDTIME, Disp: 90 tablet, Rfl: 1 .  sitaGLIPtin (JANUVIA) 100 MG tablet, Take 1 tablet (100 mg total) by mouth daily., Disp: 90 tablet, Rfl: 0 .  traMADol (ULTRAM) 50 MG tablet, Take 1 tablet (50 mg total) by mouth daily as needed., Disp: 30 tablet, Rfl: 0 .  Vitamin D, Ergocalciferol, (DRISDOL) 50000 units CAPS capsule, Take 1 capsule (50,000 Units total) by mouth every 7 (seven) days., Disp: 12 capsule, Rfl: 0  No Known Allergies   Review of Systems  Constitutional: Negative for fatigue.  Eyes: Negative for blurred vision.  Respiratory: Negative for shortness of breath.   Cardiovascular: Negative for palpitations and orthopnea.  Musculoskeletal: Negative for myalgias.  Neurological: Negative for headaches.  Endo/Heme/Allergies: Negative for polydipsia.     Objective  Vitals:   09/11/16 0937  BP: 128/68  Pulse: 89  Resp: 16  Temp: 97.8 F (36.6 C)  TempSrc: Oral  SpO2: 99%  Weight: 191 lb 4.8 oz (86.8 kg)  Height: 4\' 11"  (1.499 m)    Physical Exam  Constitutional: She is oriented to person, place, and time and well-developed, well-nourished, and in no distress.  Cardiovascular: Normal rate, regular rhythm and normal heart sounds.   No murmur heard. Pulmonary/Chest: Effort normal and breath sounds normal. She has no wheezes.  Abdominal: Soft. Bowel sounds are normal. There is no tenderness.  Musculoskeletal: She exhibits no edema.  Neurological: She is alert and oriented to person, place, and time.  Psychiatric: Mood, memory, affect and judgment normal.  Nursing note and vitals reviewed.      Recent Results (from the past 2160 hour(s))  POCT Glucose (CBG)     Status: Abnormal   Collection Time: 09/11/16  9:40 AM  Result Value Ref Range   POC Glucose 145 (A) 70 - 99 mg/dl  POCT HgB Z6X     Status: Abnormal    Collection Time: 09/11/16  9:42 AM  Result Value Ref Range   Hemoglobin A1C 7.1      Assessment & Plan  1. Type 2 diabetes mellitus without complication, without long-term current use of insulin (HCC) Point-of-care A1c 7.1%, well-controlled diabetes, no change in pharmacotherapy - POCT HgB A1C - POCT Glucose (CBG) - Urine Microalbumin w/creat. ratio - glyBURIDE (DIABETA) 5 MG tablet; Take 1 tablet (5 mg total) by mouth 2 (two) times daily with a meal.  Dispense: 180 tablet; Refill: 0 - pioglitazone (ACTOS) 30 MG tablet; Take 1 tablet (30 mg total) by mouth daily.  Dispense: 90 tablet; Refill: 0  2. Essential hypertension BP stable on present antihypertensive treatment  3. Pure hypercholesterolemia FLP at goal from February 2018, continue statin   Vinh Sachs [facility] 09/11/2016 9:48 AM

## 2016-10-08 ENCOUNTER — Ambulatory Visit: Payer: Self-pay | Admitting: Physician Assistant

## 2016-10-08 VITALS — BP 120/78 | HR 104 | Temp 99.1°F

## 2016-10-08 DIAGNOSIS — J209 Acute bronchitis, unspecified: Secondary | ICD-10-CM

## 2016-10-08 DIAGNOSIS — R062 Wheezing: Secondary | ICD-10-CM

## 2016-10-08 MED ORDER — AZITHROMYCIN 250 MG PO TABS: ORAL_TABLET | ORAL | 0 refills | Status: DC

## 2016-10-08 MED ORDER — IPRATROPIUM-ALBUTEROL 0.5-2.5 (3) MG/3ML IN SOLN
3.0000 mL | Freq: Four times a day (QID) | RESPIRATORY_TRACT | Status: DC
  Administered 2016-10-08: 3 mL via RESPIRATORY_TRACT

## 2016-10-08 MED ORDER — IPRATROPIUM-ALBUTEROL 0.5-2.5 (3) MG/3ML IN SOLN: 3.0000 mL | Freq: Once | RESPIRATORY_TRACT | Status: DC

## 2016-10-08 MED ORDER — HYDROCOD POLST-CPM POLST ER 10-8 MG/5ML PO SUER: 5.0000 mL | Freq: Two times a day (BID) | ORAL | 0 refills | Status: DC | PRN

## 2016-10-08 MED ORDER — ALBUTEROL SULFATE HFA 108 (90 BASE) MCG/ACT IN AERS: 2.0000 | INHALATION_SPRAY | Freq: Four times a day (QID) | RESPIRATORY_TRACT | 0 refills | Status: DC | PRN

## 2016-10-08 MED ORDER — METHYLPREDNISOLONE 4 MG PO TBPK: ORAL_TABLET | ORAL | 0 refills | Status: DC

## 2016-10-08 NOTE — Progress Notes (Signed)
S: C/o cough and congestion with wheezing and chest pain, chest is sore from coughing, +fever, chills, mucus is yellow; keeping pt awake at night;  denies cardiac type chest pain or sob, v/d, abd pain Remainder ros neg  O: vitals wnl, nad, tms clear, throat injected, neck supple no lymph, lungs with wheezing, cv rrr, neuro intact, svn duoneb given, some increase in air movement, still wheezing  A:  Acute bronchitis   P:  rx medication:  Medrol dose pack, zpack, albuterol inhaler, tussionex nr, ; smoking cessation, use otc meds, tylenol or motrin as needed for fever/chills, return if not better in 3 -5 days, return earlier if worsening

## 2016-10-22 ENCOUNTER — Ambulatory Visit
Admission: RE | Admit: 2016-10-22 | Discharge: 2016-10-22 | Disposition: A | Discharge status: Home / Self Care | Payer: 59 | Source: Ambulatory Visit | Attending: Family Medicine | Admitting: Family Medicine

## 2016-10-22 DIAGNOSIS — Z01419 Encounter for gynecological examination (general) (routine) without abnormal findings: Secondary | ICD-10-CM

## 2016-10-22 DIAGNOSIS — Z1231 Encounter for screening mammogram for malignant neoplasm of breast: Secondary | ICD-10-CM | POA: Diagnosis not present

## 2016-10-22 DIAGNOSIS — M8588 Other specified disorders of bone density and structure, other site: Secondary | ICD-10-CM | POA: Diagnosis not present

## 2016-10-22 DIAGNOSIS — Z13820 Encounter for screening for osteoporosis: Secondary | ICD-10-CM | POA: Diagnosis not present

## 2016-10-22 DIAGNOSIS — M85852 Other specified disorders of bone density and structure, left thigh: Secondary | ICD-10-CM | POA: Insufficient documentation

## 2016-10-23 ENCOUNTER — Other Ambulatory Visit: Payer: Self-pay | Admitting: Family Medicine

## 2016-10-23 DIAGNOSIS — I10 Essential (primary) hypertension: Secondary | ICD-10-CM

## 2016-10-23 DIAGNOSIS — E119 Type 2 diabetes mellitus without complications: Secondary | ICD-10-CM

## 2016-11-04 ENCOUNTER — Ambulatory Visit: Payer: Self-pay | Admitting: Physician Assistant

## 2016-11-04 ENCOUNTER — Encounter: Payer: Self-pay | Admitting: Physician Assistant

## 2016-11-04 VITALS — BP 140/80 | HR 111 | Temp 99.1°F

## 2016-11-04 DIAGNOSIS — J209 Acute bronchitis, unspecified: Secondary | ICD-10-CM

## 2016-11-04 MED ORDER — HYDROCOD POLST-CPM POLST ER 10-8 MG/5ML PO SUER: 5.0000 mL | Freq: Two times a day (BID) | ORAL | 0 refills | Status: DC | PRN

## 2016-11-04 MED ORDER — IPRATROPIUM-ALBUTEROL 0.5-2.5 (3) MG/3ML IN SOLN: 3.0000 mL | Freq: Once | RESPIRATORY_TRACT | Status: DC

## 2016-11-04 MED ORDER — LEVOFLOXACIN 500 MG PO TABS: 500.0000 mg | ORAL_TABLET | Freq: Every day | ORAL | 0 refills | Status: DC

## 2016-11-04 MED ORDER — IPRATROPIUM-ALBUTEROL 0.5-2.5 (3) MG/3ML IN SOLN: 3.0000 mL | Freq: Four times a day (QID) | RESPIRATORY_TRACT | 12 refills | Status: DC | PRN

## 2016-11-04 NOTE — Progress Notes (Signed)
S: C/o cough and congestion with wheezing and chest pain, chest is sore from coughing, +fever, chills, mucus is green, mostly cough is dry and hacking; keeping pt awake at night;  denies cardiac type chest pain or sob, v/d, abd pain; had flu shot about 2 weeks ago Remainder ros neg  O: vitals wnl, nad, tms clear, throat injected, neck supple no lymph, lungs with wheezing, cv rrr, neuro intact, svn duoneb given, increased air movement after tx  A:  Acute bronchitis   P:  rx medication:  levaquin  qd x7d, duoneb nebules, tussionex nr; smoking cessation, use otc meds, tylenol or motrin as needed for fever/chills, return if not better in 3 -5 days, return earlier if worsening, talk to pcp about getting a referral to pulmonology

## 2016-11-07 ENCOUNTER — Ambulatory Visit: Payer: Self-pay | Admitting: Physician Assistant

## 2016-11-07 VITALS — BP 140/70 | HR 108 | Temp 98.4°F

## 2016-11-07 DIAGNOSIS — J209 Acute bronchitis, unspecified: Secondary | ICD-10-CM

## 2016-11-07 MED ORDER — IPRATROPIUM-ALBUTEROL 0.5-2.5 (3) MG/3ML IN SOLN: 3.0000 mL | Freq: Once | RESPIRATORY_TRACT | Status: DC

## 2016-11-07 MED ORDER — METHYLPREDNISOLONE 4 MG PO TBPK
ORAL_TABLET | ORAL | 0 refills | Status: DC
Start: 2016-11-07 — End: 2016-12-12

## 2016-11-07 NOTE — Progress Notes (Signed)
S: c/o continued cough and congestion, feels really tired and fatigued, a little short of breath, ?if she needs steroid, is on day 3 of levaquin, does feel a little better but not great, is using nebs at home, flu shot was 2 weeks ago  O: vitals w pulse ox at 92%, lungs with wheezing, cv rrr, pt able to talk in full sentences, svn duoneb, increased air movment, pulse ox 95-96%,   A: acute bronchitis  P: medrol dose pack, continue medication, if worsening pt is to go to the ER

## 2016-11-18 ENCOUNTER — Other Ambulatory Visit: Payer: Self-pay | Admitting: Family Medicine

## 2016-11-18 DIAGNOSIS — E119 Type 2 diabetes mellitus without complications: Secondary | ICD-10-CM

## 2016-12-12 ENCOUNTER — Encounter: Payer: Self-pay | Admitting: Family Medicine

## 2016-12-12 ENCOUNTER — Ambulatory Visit (INDEPENDENT_AMBULATORY_CARE_PROVIDER_SITE_OTHER): Payer: 59 | Admitting: Family Medicine

## 2016-12-12 VITALS — BP 130/82 | HR 96 | Temp 98.1°F | Resp 16 | Ht 59.0 in | Wt 186.6 lb

## 2016-12-12 DIAGNOSIS — E119 Type 2 diabetes mellitus without complications: Secondary | ICD-10-CM | POA: Diagnosis not present

## 2016-12-12 DIAGNOSIS — I10 Essential (primary) hypertension: Secondary | ICD-10-CM | POA: Diagnosis not present

## 2016-12-12 DIAGNOSIS — E785 Hyperlipidemia, unspecified: Secondary | ICD-10-CM | POA: Diagnosis not present

## 2016-12-12 LAB — LIPID PANEL
CHOLESTEROL: 141 mg/dL (ref <200)
HDL: 69 mg/dL (ref >50)
LDL Cholesterol (Calc): 57 mg/dL (calc)
Non-HDL Cholesterol (Calc): 72 mg/dL (calc) (ref <130)
Total CHOL/HDL Ratio: 2 (calc) (ref <5.0)
Triglycerides: 70 mg/dL (ref <150)

## 2016-12-12 LAB — POCT GLYCOSYLATED HEMOGLOBIN (HGB A1C): Hemoglobin A1C: 6.2

## 2016-12-12 LAB — GLUCOSE, POCT (MANUAL RESULT ENTRY): POC GLUCOSE: 120 mg/dL — AB (ref 70–99)

## 2016-12-12 LAB — MICROALBUMIN / CREATININE URINE RATIO
Creatinine, Urine: 97 mg/dL (ref 20–275)
MICROALB UR: 0.8 mg/dL
MICROALB/CREAT RATIO: 8 ug/mg{creat} (ref <30)

## 2016-12-12 MED ORDER — SIMVASTATIN 40 MG PO TABS: ORAL_TABLET | ORAL | 1 refills | Status: DC

## 2016-12-12 MED ORDER — PROPRANOLOL HCL 10 MG PO TABS: 10.0000 mg | ORAL_TABLET | Freq: Two times a day (BID) | ORAL | 0 refills | Status: DC

## 2016-12-12 MED ORDER — BENAZEPRIL HCL 40 MG PO TABS: ORAL_TABLET | ORAL | 0 refills | Status: DC

## 2016-12-12 MED ORDER — GLYBURIDE 5 MG PO TABS: 5.0000 mg | ORAL_TABLET | Freq: Two times a day (BID) | ORAL | 0 refills | Status: DC

## 2016-12-12 MED ORDER — PIOGLITAZONE HCL 30 MG PO TABS: 30.0000 mg | ORAL_TABLET | Freq: Every day | ORAL | 0 refills | Status: DC

## 2016-12-12 MED ORDER — BUMETANIDE 0.5 MG PO TABS: 0.5000 mg | ORAL_TABLET | Freq: Two times a day (BID) | ORAL | 0 refills | Status: DC

## 2016-12-12 MED ORDER — METFORMIN HCL 1000 MG PO TABS: 1000.0000 mg | ORAL_TABLET | Freq: Two times a day (BID) | ORAL | 0 refills | Status: DC

## 2016-12-12 NOTE — Progress Notes (Signed)
Name: Tracey Tucker   MRN: [id number]    DOB: 1955-12-10   Date:12/12/2016       Progress Note  Subjective  Chief Complaint  Chief Complaint  Patient presents with  . Diabetes  . Hyperlipidemia  . Hypertension  . Medication Refill    Diabetes  She presents for her follow-up diabetic visit. She has type 2 diabetes mellitus. Her disease course has been stable. There are no hypoglycemic associated symptoms. Pertinent negatives for hypoglycemia include no dizziness, headaches, hunger or sweats. Pertinent negatives for diabetes include no blurred vision, no chest pain, no fatigue, no foot paresthesias, no polydipsia and no polyuria. Pertinent negatives for diabetic complications include no CVA, heart disease or peripheral neuropathy. Current diabetic treatment includes oral agent (triple therapy). Her weight is stable. She is following a generally healthy diet. Frequency home blood tests: does not check her blood glucose.  Hyperlipidemia  This is a chronic problem. The problem is controlled. Recent lipid tests were reviewed and are normal. Pertinent negatives include no chest pain, leg pain, myalgias or shortness of breath. Current antihyperlipidemic treatment includes statins.  Hypertension  This is a chronic problem. The problem is unchanged. The problem is controlled. Pertinent negatives include no blurred vision, chest pain, headaches, palpitations, shortness of breath or sweats. There is no history of CVA.      Past Medical History:  Diagnosis Date  . Diabetes mellitus without complication (HCC)   . Hypercholesteremia   . Hypertension   . Osteoporosis   . Seasonal allergies   . Wears dentures    full upper, partial lower    Past Surgical History:  Procedure Laterality Date  . CATARACT EXTRACTION Left 05/17/14   [facility]  . CATARACT EXTRACTION W/PHACO Right 07/26/2014   Procedure: CATARACT EXTRACTION PHACO AND INTRAOCULAR LENS PLACEMENT (IOC);  Surgeon: Lockie Molahadwick  [place], MD;  [facility];  Service: Ophthalmology;  Laterality: Right;  DIABETIC  . CESAREAN SECTION    . FOOT SURGERY  2014    Family History  Problem Relation Age of Onset  . Diabetes Father   . Diabetes Sister   . Hyperlipidemia Sister   . Hypertension Sister   . Diabetes Brother   . Hypertension Brother     Social History   Social History  . Marital status: Single    Spouse name: N/A  . Number of children: N/A  . Years of education: N/A   Occupational History  . Not on file.   Social History Main Topics  . Smoking status: Current Every Day Smoker    Packs/day: 1.00    Years: 20.00    Types: Cigarettes  . Smokeless tobacco: Never Used  . Alcohol use No  . Drug use: No  . Sexual activity: Not Currently   Other Topics Concern  . Not on file   Social History Narrative  . No narrative on file     Current Outpatient Prescriptions:  .  albuterol (PROVENTIL HFA;VENTOLIN HFA) 108 (90 Base) MCG/ACT inhaler, Inhale 2 puffs into the lungs every 6 (six) hours as needed for wheezing or shortness of breath., Disp: 1 Inhaler, Rfl: 0 .  aspirin 81 MG tablet, Take 81 mg by mouth daily. PM, Disp: , Rfl:  .  benazepril (LOTENSIN) 40 MG tablet, TAKE 1 TABLET (40 MG TOTAL) BY MOUTH DAILY IN THE MORNING, Disp: 90 tablet, Rfl: 0 .  bumetanide (BUMEX) 0.5 MG tablet, TAKE 1 TABLET BY MOUTH TWICE A DAY, Disp: 180 tablet, Rfl:  0 .  glyBURIDE (DIABETA) 5 MG tablet, Take 1 tablet (5 mg total) by mouth 2 (two) times daily with a meal., Disp: 180 tablet, Rfl: 0 .  levofloxacin (LEVAQUIN) 500 MG tablet, Take 1 tablet (500 mg total) by mouth daily., Disp: 10 tablet, Rfl: 0 .  metFORMIN (GLUCOPHAGE) 1000 MG tablet, TAKE 1 TABLET (1,000 MG TOTAL) BY MOUTH 2 (TWO) TIMES DAILY., Disp: 180 tablet, Rfl: 0 .  pioglitazone (ACTOS) 30 MG tablet, TAKE 1 TABLET (30 MG TOTAL) BY MOUTH DAILY., Disp: 90 tablet, Rfl: 0 .  propranolol (INDERAL) 10 MG tablet, TAKE 1 TABLET (10 MG TOTAL)  BY MOUTH 2 (TWO) TIMES DAILY., Disp: 180 tablet, Rfl: 0 .  simvastatin (ZOCOR) 40 MG tablet, TAKE 1 TABLET BY MOUTH NIGHTLY AT BEDTIME, Disp: 90 tablet, Rfl: 1 .  sitaGLIPtin (JANUVIA) 100 MG tablet, Take 1 tablet (100 mg total) by mouth daily., Disp: 90 tablet, Rfl: 0 .  chlorpheniramine-HYDROcodone (TUSSIONEX PENNKINETIC ER) 10-8 MG/5ML SUER, Take 5 mLs by mouth every 12 (twelve) hours as needed for cough. (Patient not taking: Reported on 12/12/2016), Disp: 150 mL, Rfl: 0 .  ipratropium-albuterol (DUONEB) 0.5-2.5 (3) MG/3ML SOLN, Take 3 mLs by nebulization every 6 (six) hours as needed. (Patient not taking: Reported on 12/12/2016), Disp: 360 mL, Rfl: 12 .  latanoprost (XALATAN) 0.005 % ophthalmic solution, 1 drop at bedtime., Disp: , Rfl:  .  methylPREDNISolone (MEDROL DOSEPAK) 4 MG TBPK tablet, Take 6 pills on day one then decrease by 1 pill each day (Patient not taking: Reported on 12/12/2016), Disp: 21 tablet, Rfl: 0 .  pioglitazone (ACTOS) 30 MG tablet, Take 1 tablet (30 mg total) by mouth daily. (Patient not taking: Reported on 12/12/2016), Disp: 90 tablet, Rfl: 0 .  Vitamin D, Ergocalciferol, (DRISDOL) 50000 units CAPS capsule, Take 1 capsule (50,000 Units total) by mouth every 7 (seven) days. (Patient not taking: Reported on 12/12/2016), Disp: 12 capsule, Rfl: 0  Current Facility-Administered Medications:  .  ipratropium-albuterol (DUONEB) 0.5-2.5 (3) MG/3ML nebulizer solution 3 mL, 3 mL, Nebulization, Once, Fisher, Susan W, PA-C .  ipratropium-albuterol (DUONEB) 0.5-2.5 (3) MG/3ML nebulizer solution 3 mL, 3 mL, Nebulization, Once, Fisher, Susan W, PA-C .  ipratropium-albuterol (DUONEB) 0.5-2.5 (3) MG/3ML nebulizer solution 3 mL, 3 mL, Nebulization, Once, Fisher, Roselyn Bering, PA-C  No Known Allergies   Review of Systems  Constitutional: Negative for fatigue.  Eyes: Negative for blurred vision.  Respiratory: Negative for shortness of breath.   Cardiovascular: Negative for chest pain and  palpitations.  Musculoskeletal: Negative for myalgias.  Neurological: Negative for dizziness and headaches.  Endo/Heme/Allergies: Negative for polydipsia.    Objective  Vitals:   12/12/16 1003  BP: 130/82  Pulse: 96  Resp: 16  Temp: 98.1 F (36.7 C)  TempSrc: Oral  SpO2: 98%  Weight: 186 lb 9.6 oz (84.6 kg)  Height: 4\' 11"  (1.499 m)    Physical Exam  Constitutional: She is oriented to person, place, and time and well-developed, well-nourished, and in no distress.  HENT:  Head: Normocephalic and atraumatic.  Cardiovascular: Normal rate, regular rhythm and normal heart sounds.   No murmur heard. Pulmonary/Chest: Effort normal and breath sounds normal. She has no wheezes.  Abdominal: Soft. Bowel sounds are normal.  Musculoskeletal: She exhibits no edema.  Neurological: She is alert and oriented to person, place, and time.  Psychiatric: Mood, memory, affect and judgment normal.  Nursing note and vitals reviewed.      Recent Results (from the past 2160 hour(s))  POCT Glucose (CBG)     Status: Abnormal   Collection Time: 12/12/16 10:17 AM  Result Value Ref Range   POC Glucose 120 (A) 70 - 99 mg/dl  POCT HgB A5W     Status: None   Collection Time: 12/12/16 10:19 AM  Result Value Ref Range   Hemoglobin A1C 6.2      Assessment & Plan  1. Type 2 diabetes mellitus without complication, without long-term current use of insulin (HCC) A1c is 6.2%, well-controlled Diabetes. No change in treatment - POCT HgB A1C - POCT Glucose (CBG) - glyBURIDE (DIABETA) 5 MG tablet; Take 1 tablet (5 mg total) by mouth 2 (two) times daily with a meal.  Dispense: 180 tablet; Refill: 0 - metFORMIN (GLUCOPHAGE) 1000 MG tablet; Take 1 tablet (1,000 mg total) by mouth 2 (two) times daily.  Dispense: 180 tablet; Refill: 0 - pioglitazone (ACTOS) 30 MG tablet; Take 1 tablet (30 mg total) by mouth daily.  Dispense: 90 tablet; Refill: 0  2. Hyperlipidemia, unspecified hyperlipidemia type FLP at  goal, continue on statin. - simvastatin (ZOCOR) 40 MG tablet; TAKE 1 TABLET BY MOUTH NIGHTLY AT BEDTIME  Dispense: 90 tablet; Refill: 1 - Lipid panel - Urine Microalbumin w/creat. ratio  3. Essential hypertension  - bumetanide (BUMEX) 0.5 MG tablet; Take 1 tablet (0.5 mg total) by mouth 2 (two) times daily.  Dispense: 180 tablet; Refill: 0 - propranolol (INDERAL) 10 MG tablet; Take 1 tablet (10 mg total) by mouth 2 (two) times daily.  Dispense: 180 tablet; Refill: 0 - benazepril (LOTENSIN) 40 MG tablet; TAKE 1 TABLET (40 MG TOTAL) BY MOUTH DAILY IN THE MORNING  Dispense: 90 tablet; Refill: 0   Akia Desroches [facility]ad [facility] 12/12/2016 10:21 AM

## 2017-01-06 DIAGNOSIS — H5203 Hypermetropia, bilateral: Secondary | ICD-10-CM | POA: Diagnosis not present

## 2017-01-06 LAB — HM DIABETES EYE EXAM

## 2017-02-23 ENCOUNTER — Other Ambulatory Visit: Payer: Self-pay | Admitting: Family Medicine

## 2017-02-23 DIAGNOSIS — E119 Type 2 diabetes mellitus without complications: Secondary | ICD-10-CM

## 2017-03-13 ENCOUNTER — Ambulatory Visit (INDEPENDENT_AMBULATORY_CARE_PROVIDER_SITE_OTHER): Payer: 59 | Admitting: Family Medicine

## 2017-03-13 ENCOUNTER — Encounter: Payer: Self-pay | Admitting: Family Medicine

## 2017-03-13 DIAGNOSIS — E785 Hyperlipidemia, unspecified: Secondary | ICD-10-CM

## 2017-03-13 DIAGNOSIS — E119 Type 2 diabetes mellitus without complications: Secondary | ICD-10-CM

## 2017-03-13 DIAGNOSIS — I10 Essential (primary) hypertension: Secondary | ICD-10-CM

## 2017-03-13 MED ORDER — PIOGLITAZONE HCL 30 MG PO TABS: 30.0000 mg | ORAL_TABLET | Freq: Every day | ORAL | 0 refills | Status: DC

## 2017-03-13 MED ORDER — PROPRANOLOL HCL 10 MG PO TABS: 10.0000 mg | ORAL_TABLET | Freq: Two times a day (BID) | ORAL | 0 refills | Status: DC

## 2017-03-13 MED ORDER — METFORMIN HCL 1000 MG PO TABS: 1000.0000 mg | ORAL_TABLET | Freq: Two times a day (BID) | ORAL | 0 refills | Status: DC

## 2017-03-13 MED ORDER — SITAGLIPTIN PHOSPHATE 100 MG PO TABS: 100.0000 mg | ORAL_TABLET | Freq: Every day | ORAL | 0 refills | Status: DC

## 2017-03-13 MED ORDER — GLYBURIDE 5 MG PO TABS: 5.0000 mg | ORAL_TABLET | Freq: Two times a day (BID) | ORAL | 0 refills | Status: DC

## 2017-03-13 MED ORDER — BENAZEPRIL HCL 40 MG PO TABS: ORAL_TABLET | ORAL | 0 refills | Status: DC

## 2017-03-13 MED ORDER — BUMETANIDE 0.5 MG PO TABS: 0.5000 mg | ORAL_TABLET | Freq: Two times a day (BID) | ORAL | 0 refills | Status: DC

## 2017-03-13 MED ORDER — SIMVASTATIN 40 MG PO TABS: ORAL_TABLET | ORAL | 1 refills | Status: DC

## 2017-03-13 NOTE — Progress Notes (Signed)
Name: Tracey Tucker   MRN: [id number]    DOB: January 02, 1956   Date:03/13/2017       Progress Note  Subjective  Chief Complaint  Chief Complaint  Patient presents with  . Diabetes  . Hypertension    Pt denies any issue   . Hyperlipidemia  . Medication Refill    Diabetes  She presents for her follow-up diabetic visit. She has type 2 diabetes mellitus. Her disease course has been stable. There are no hypoglycemic associated symptoms. Pertinent negatives for hypoglycemia include no dizziness, headaches, hunger or sweats. Pertinent negatives for diabetes include no blurred vision, no chest pain, no fatigue, no foot paresthesias, no polydipsia and no polyuria. Pertinent negatives for diabetic complications include no CVA, heart disease or peripheral neuropathy. Current diabetic treatment includes oral agent (triple therapy). Her weight is stable. She is following a generally healthy diet. She rarely (walks outside when the temperature is warm) participates in exercise. Frequency home blood tests: does not check her blood glucose.  Hypertension  This is a chronic problem. The problem is unchanged. The problem is controlled. Pertinent negatives include no blurred vision, chest pain, headaches, palpitations, shortness of breath or sweats. Past treatments include ACE inhibitors and beta blockers. There is no history of CVA.  Hyperlipidemia  This is a chronic problem. The problem is controlled. Recent lipid tests were reviewed and are normal. Pertinent negatives include no chest pain, leg pain, myalgias or shortness of breath. Current antihyperlipidemic treatment includes statins.     Past Medical History:  Diagnosis Date  . Diabetes mellitus without complication (HCC)   . Hypercholesteremia   . Hypertension   . Osteoporosis   . Seasonal allergies   . Wears dentures    full upper, partial lower    Past Surgical History:  Procedure Laterality Date  . CATARACT EXTRACTION Left 05/17/14   [facility]  [place]  . CATARACT EXTRACTION W/PHACO Right 07/26/2014   Procedure: CATARACT EXTRACTION PHACO AND INTRAOCULAR LENS PLACEMENT (IOC);  Surgeon: Lockie Mola, MD;  Location: [facility];  Service: Ophthalmology;  Laterality: Right;  DIABETIC  . CESAREAN SECTION    . FOOT SURGERY  2014    Family History  Problem Relation Age of Onset  . Diabetes Father   . Heart disease Father   . Diabetes Sister   . Hyperlipidemia Sister   . Hypertension Sister   . Hypertension Brother   . Diabetes Brother   . Diabetes Sister   . Kidney disease Sister   . Heart attack Sister   . Diabetes Sister   . Diabetes Sister   . Diabetes Brother   . Diabetes Brother   . Diabetes Brother     Social History   Socioeconomic History  . Marital status: Single    Spouse name: Not on file  . Number of children: Not on file  . Years of education: Not on file  . Highest education level: Not on file  Social Needs  . Financial resource strain: Not on file  . Food insecurity - worry: Not on file  . Food insecurity - inability: Not on file  . Transportation needs - medical: Not on file  . Transportation needs - non-medical: Not on file  Occupational History  . Not on file  Tobacco Use  . Smoking status: Current Every Day Smoker    Packs/day: 1.00    Years: 20.00    Pack years: 20.00    Types: Cigarettes  . Smokeless tobacco: Never Used  Substance and Sexual Activity  . Alcohol use: No    Alcohol/week: 0.0 oz  . Drug use: No  . Sexual activity: Not Currently  Other Topics Concern  . Not on file  Social History Narrative  . Not on file     Current Outpatient Medications:  .  aspirin 81 MG tablet, Take 81 mg by mouth daily. PM, Disp: , Rfl:  .  benazepril (LOTENSIN) 40 MG tablet, TAKE 1 TABLET (40 MG TOTAL) BY MOUTH DAILY IN THE MORNING, Disp: 90 tablet, Rfl: 0 .  bumetanide (BUMEX) 0.5 MG tablet, Take 1 tablet (0.5 mg total) by mouth 2 (two) times daily., Disp: 180 tablet, Rfl:  0 .  glyBURIDE (DIABETA) 5 MG tablet, Take 1 tablet (5 mg total) by mouth 2 (two) times daily with a meal., Disp: 180 tablet, Rfl: 0 .  metFORMIN (GLUCOPHAGE) 1000 MG tablet, Take 1 tablet (1,000 mg total) by mouth 2 (two) times daily., Disp: 180 tablet, Rfl: 0 .  pioglitazone (ACTOS) 30 MG tablet, Take 1 tablet (30 mg total) by mouth daily., Disp: 90 tablet, Rfl: 0 .  propranolol (INDERAL) 10 MG tablet, Take 1 tablet (10 mg total) by mouth 2 (two) times daily., Disp: 180 tablet, Rfl: 0 .  simvastatin (ZOCOR) 40 MG tablet, TAKE 1 TABLET BY MOUTH NIGHTLY AT BEDTIME, Disp: 90 tablet, Rfl: 1 .  sitaGLIPtin (JANUVIA) 100 MG tablet, Take 1 tablet (100 mg total) by mouth daily., Disp: 90 tablet, Rfl: 0 .  albuterol (PROVENTIL HFA;VENTOLIN HFA) 108 (90 Base) MCG/ACT inhaler, Inhale 2 puffs into the lungs every 6 (six) hours as needed for wheezing or shortness of breath. (Patient not taking: Reported on 03/13/2017), Disp: 1 Inhaler, Rfl: 0 .  levofloxacin (LEVAQUIN) 500 MG tablet, Take 1 tablet (500 mg total) by mouth daily. (Patient not taking: Reported on 03/13/2017), Disp: 10 tablet, Rfl: 0 .  pioglitazone (ACTOS) 30 MG tablet, TAKE 1 TABLET (30 MG TOTAL) BY MOUTH DAILY. (Patient not taking: Reported on 03/13/2017), Disp: 90 tablet, Rfl: 0  No Known Allergies   Review of Systems  Constitutional: Negative for fatigue.  Eyes: Negative for blurred vision.  Respiratory: Negative for shortness of breath.   Cardiovascular: Negative for chest pain and palpitations.  Musculoskeletal: Negative for myalgias.  Neurological: Negative for dizziness and headaches.  Endo/Heme/Allergies: Negative for polydipsia.     Objective  Vitals:   03/13/17 0941  BP: 128/86  Pulse: 87  Resp: 14  Temp: 97.8 F (36.6 C)  TempSrc: Oral  SpO2: 98%  Weight: 193 lb 12.8 oz (87.9 kg)    Physical Exam  Constitutional: She is oriented to person, place, and time and well-developed, well-nourished, and in no distress.   Cardiovascular: Normal rate, regular rhythm and normal heart sounds.  No murmur heard. Pulmonary/Chest: Effort normal and breath sounds normal. She has no wheezes.  Abdominal: Soft. Bowel sounds are normal. There is no tenderness.  Musculoskeletal: She exhibits no edema.  Neurological: She is alert and oriented to person, place, and time.  Psychiatric: Mood, memory, affect and judgment normal.  Nursing note and vitals reviewed.      Recent Results (from the past 2160 hour(s))  HM DIABETES EYE EXAM     Status: None   Collection Time: 01/06/17 12:00 AM  Result Value Ref Range   HM Diabetic Eye Exam No Retinopathy No Retinopathy     Assessment & Plan  1. Essential hypertension Blood pressure stable on present antihypertensive treatment - benazepril (LOTENSIN) 40  MG tablet; TAKE 1 TABLET (40 MG TOTAL) BY MOUTH DAILY IN THE MORNING  Dispense: 90 tablet; Refill: 0 - bumetanide (BUMEX) 0.5 MG tablet; Take 1 tablet (0.5 mg total) by mouth 2 (two) times daily.  Dispense: 180 tablet; Refill: 0 - propranolol (INDERAL) 10 MG tablet; Take 1 tablet (10 mg total) by mouth 2 (two) times daily.  Dispense: 180 tablet; Refill: 0  2. Hyperlipidemia, unspecified hyperlipidemia type  - simvastatin (ZOCOR) 40 MG tablet; TAKE 1 TABLET BY MOUTH NIGHTLY AT BEDTIME  Dispense: 90 tablet; Refill: 1 - Lipid panel  3. Type 2 diabetes mellitus without complication, without long-term current use of insulin (HCC)  - glyBURIDE (DIABETA) 5 MG tablet; Take 1 tablet (5 mg total) by mouth 2 (two) times daily with a meal.  Dispense: 180 tablet; Refill: 0 - metFORMIN (GLUCOPHAGE) 1000 MG tablet; Take 1 tablet (1,000 mg total) by mouth 2 (two) times daily.  Dispense: 180 tablet; Refill: 0 - pioglitazone (ACTOS) 30 MG tablet; Take 1 tablet (30 mg total) by mouth daily.  Dispense: 90 tablet; Refill: 0 - sitaGLIPtin (JANUVIA) 100 MG tablet; Take 1 tablet (100 mg total) by mouth daily.  Dispense: 90 tablet; Refill: 0 -  HgB A1c   Tracey Tucker Asad A. Faylene Kurt[facility] 03/13/2017 9:51 AM

## 2017-03-14 LAB — HEMOGLOBIN A1C
Hgb A1c MFr Bld: 7.7 % of total Hgb — ABNORMAL HIGH (ref <5.7)
Mean Plasma Glucose: 174 (calc)
eAG (mmol/L): 9.7 (calc)

## 2017-03-14 LAB — LIPID PANEL
CHOL/HDL RATIO: 2.3 (calc) (ref <5.0)
Cholesterol: 142 mg/dL (ref <200)
HDL: 63 mg/dL (ref >50)
LDL CHOLESTEROL (CALC): 65 mg/dL
Non-HDL Cholesterol (Calc): 79 mg/dL (calc) (ref <130)
TRIGLYCERIDES: 61 mg/dL (ref <150)

## 2017-03-18 ENCOUNTER — Other Ambulatory Visit: Payer: Self-pay | Admitting: Family Medicine

## 2017-03-18 DIAGNOSIS — E119 Type 2 diabetes mellitus without complications: Secondary | ICD-10-CM

## 2017-03-19 ENCOUNTER — Ambulatory Visit (INDEPENDENT_AMBULATORY_CARE_PROVIDER_SITE_OTHER): Payer: 59 | Admitting: Family Medicine

## 2017-03-19 ENCOUNTER — Encounter: Payer: Self-pay | Admitting: Family Medicine

## 2017-03-19 VITALS — BP 126/82 | HR 95 | Temp 98.2°F | Resp 16 | Ht 59.0 in | Wt 192.4 lb

## 2017-03-19 DIAGNOSIS — Z0001 Encounter for general adult medical examination with abnormal findings: Secondary | ICD-10-CM | POA: Diagnosis not present

## 2017-03-19 DIAGNOSIS — E559 Vitamin D deficiency, unspecified: Secondary | ICD-10-CM | POA: Diagnosis not present

## 2017-03-19 DIAGNOSIS — Z1159 Encounter for screening for other viral diseases: Secondary | ICD-10-CM | POA: Diagnosis not present

## 2017-03-19 DIAGNOSIS — Z1231 Encounter for screening mammogram for malignant neoplasm of breast: Secondary | ICD-10-CM

## 2017-03-19 DIAGNOSIS — Z1239 Encounter for other screening for malignant neoplasm of breast: Secondary | ICD-10-CM

## 2017-03-19 DIAGNOSIS — Z0000 Encounter for general adult medical examination without abnormal findings: Secondary | ICD-10-CM | POA: Diagnosis not present

## 2017-03-19 NOTE — Progress Notes (Signed)
Name: Tracey Tucker   MRN: [id number]    DOB: [birth year]   Date:03/19/2017       Progress Note  Subjective  Chief Complaint  Chief Complaint  Patient presents with  . Annual Exam    HPI  Pt. Presents for Complete Physical Exam. Pap Smear was completed in 2018. She is due for mammogram in 2019. She never received Cologuard kit ordered last year.  She completed the BMD test in 2018, repeat in 2020. Pt. Had low Vitamin D levels last year and she finished 12 weeks of Vitamin D 50,000 units. Will repeat levels today.   Past Medical History:  Diagnosis Date  . Diabetes mellitus without complication ([place])   . Hypercholesteremia   . Hypertension   . Osteoporosis   . Seasonal allergies   . Wears dentures    full upper, partial lower    Past Surgical History:  Procedure Laterality Date  . CATARACT EXTRACTION Left 05/17/14   [facility] [place]  . CATARACT EXTRACTION W/PHACO Right 07/26/2014   Procedure: CATARACT EXTRACTION PHACO AND INTRAOCULAR LENS PLACEMENT (IOC);  Surgeon: Leandrew Koyanagi, MD;  Location: [place];  Service: Ophthalmology;  Laterality: Right;  DIABETIC  . CESAREAN SECTION    . FOOT SURGERY  2014    Family History  Problem Relation Age of Onset  . Diabetes Father   . Heart disease Father   . Diabetes Sister   . Hyperlipidemia Sister   . Hypertension Sister   . Hypertension Brother   . Diabetes Brother   . Diabetes Sister   . Kidney disease Sister   . Heart attack Sister   . Diabetes Sister   . Diabetes Sister   . Diabetes Brother   . Diabetes Brother   . Diabetes Brother     Social History   Socioeconomic History  . Marital status: Single    Spouse name: Not on file  . Number of children: Not on file  . Years of education: Not on file  . Highest education level: Not on file  Social Needs  . Financial resource strain: Not on file  . Food insecurity - worry: Not on file  . Food insecurity - inability: Not on file  . Transportation  needs - medical: Not on file  . Transportation needs - non-medical: Not on file  Occupational History  . Not on file  Tobacco Use  . Smoking status: Current Every Day Smoker    Packs/day: 1.00    Years: 20.00    Pack years: 20.00    Types: Cigarettes  . Smokeless tobacco: Never Used  Substance and Sexual Activity  . Alcohol use: No    Alcohol/week: 0.0 oz  . Drug use: No  . Sexual activity: Not Currently  Other Topics Concern  . Not on file  Social History Narrative  . Not on file     Current Outpatient Medications:  .  aspirin 81 MG tablet, Take 81 mg by mouth daily. PM, Disp: , Rfl:  .  benazepril (LOTENSIN) 40 MG tablet, TAKE 1 TABLET (40 MG TOTAL) BY MOUTH DAILY IN THE MORNING, Disp: 90 tablet, Rfl: 0 .  bumetanide (BUMEX) 0.5 MG tablet, Take 1 tablet (0.5 mg total) by mouth 2 (two) times daily., Disp: 180 tablet, Rfl: 0 .  glyBURIDE (DIABETA) 5 MG tablet, Take 1 tablet (5 mg total) by mouth 2 (two) times daily with a meal., Disp: 180 tablet, Rfl: 0 .  metFORMIN (GLUCOPHAGE) 1000 MG tablet, Take 1  tablet (1,000 mg total) by mouth 2 (two) times daily., Disp: 180 tablet, Rfl: 0 .  pioglitazone (ACTOS) 30 MG tablet, Take 1 tablet (30 mg total) by mouth daily., Disp: 90 tablet, Rfl: 0 .  propranolol (INDERAL) 10 MG tablet, Take 1 tablet (10 mg total) by mouth 2 (two) times daily., Disp: 180 tablet, Rfl: 0 .  simvastatin (ZOCOR) 40 MG tablet, TAKE 1 TABLET BY MOUTH NIGHTLY AT BEDTIME, Disp: 90 tablet, Rfl: 1 .  sitaGLIPtin (JANUVIA) 100 MG tablet, Take 1 tablet (100 mg total) by mouth daily., Disp: 90 tablet, Rfl: 0  No Known Allergies   Review of Systems  Constitutional: Negative for chills, fever, malaise/fatigue and weight loss.  HENT: Negative for congestion, ear pain, sinus pain and sore throat.   Eyes: Negative for blurred vision and double vision.  Respiratory: Negative for cough, sputum production and shortness of breath.   Cardiovascular: Negative for chest pain,  palpitations and leg swelling.  Gastrointestinal: Negative for abdominal pain, blood in stool, constipation, diarrhea, nausea and vomiting.  Genitourinary: Negative for dysuria and hematuria.  Musculoskeletal: Negative for back pain and neck pain.  Skin: Negative for rash.  Neurological: Negative for dizziness and headaches.  Psychiatric/Behavioral: Negative for depression. The patient is not nervous/anxious and does not have insomnia.     Objective  Vitals:   03/19/17 1107  BP: 126/82  Pulse: 95  Resp: 16  Temp: 98.2 F (36.8 C)  TempSrc: Oral  SpO2: 96%  Weight: 192 lb 6.4 oz (87.3 kg)  Height: 4' 11" (1.499 m)    Physical Exam  Constitutional: She is oriented to person, place, and time and well-developed, well-nourished, and in no distress.  HENT:  Head: Normocephalic and atraumatic.  Right Ear: External ear normal.  Left Ear: External ear normal.  Mouth/Throat: Oropharynx is clear and moist.  Eyes: Pupils are equal, round, and reactive to light.  Neck: Neck supple.  Cardiovascular: Normal rate, regular rhythm and normal heart sounds.  No murmur heard. Pulmonary/Chest: Effort normal and breath sounds normal. She has no wheezes.  Abdominal: Soft. Bowel sounds are normal. There is no tenderness.  Musculoskeletal: She exhibits no edema.  Neurological: She is alert and oriented to person, place, and time.  Psychiatric: Mood, memory, affect and judgment normal.  Nursing note and vitals reviewed.         Assessment & Plan  1. Well woman exam (no gynecological exam) Obtain age-appropriate laboratory screening - CBC with Differential/Platelet - TSH - COMPLETE METABOLIC PANEL WITH GFR  2. Need for hepatitis C screening test  - Hepatitis C antibody  3. Screening for breast cancer Will need mammogram completed in September 2018  4. Vitamin D deficiency Obtain vitamin D levels,  - VITAMIN D 25 Hydroxy (Vit-D Deficiency, Fractures)   Syed Asad A.  [facility] 03/19/2017 11:11 AM

## 2017-03-20 ENCOUNTER — Other Ambulatory Visit: Payer: Self-pay

## 2017-03-20 LAB — CBC WITH DIFFERENTIAL/PLATELET
BASOS PCT: 0.5 %
Basophils Absolute: 32 cells/uL (ref 0–200)
EOS ABS: 109 {cells}/uL (ref 15–500)
Eosinophils Relative: 1.7 %
HEMATOCRIT: 40.3 % (ref 35.0–45.0)
HEMOGLOBIN: 13.6 g/dL (ref 11.7–15.5)
LYMPHS ABS: 1728 {cells}/uL (ref 850–3900)
MCH: 28.5 pg (ref 27.0–33.0)
MCHC: 33.7 g/dL (ref 32.0–36.0)
MCV: 84.5 fL (ref 80.0–100.0)
MPV: 11 fL (ref 7.5–12.5)
Monocytes Relative: 7.1 %
NEUTROS ABS: 4077 {cells}/uL (ref 1500–7800)
Neutrophils Relative %: 63.7 %
Platelets: 221 10*3/uL (ref 140–400)
RBC: 4.77 10*6/uL (ref 3.80–5.10)
RDW: 13 % (ref 11.0–15.0)
TOTAL LYMPHOCYTE: 27 %
WBC: 6.4 10*3/uL (ref 3.8–10.8)
WBCMIX: 454 {cells}/uL (ref 200–950)

## 2017-03-20 LAB — HEPATITIS C ANTIBODY
HEP C AB: NONREACTIVE
SIGNAL TO CUT-OFF: 0.05 (ref <1.00)

## 2017-03-20 LAB — COMPLETE METABOLIC PANEL WITH GFR
AG Ratio: 1.7 (calc) (ref 1.0–2.5)
ALBUMIN MSPROF: 4.6 g/dL (ref 3.6–5.1)
ALT: 11 U/L (ref 6–29)
AST: 13 U/L (ref 10–35)
Alkaline phosphatase (APISO): 57 U/L (ref 33–130)
BUN: 16 mg/dL (ref 7–25)
CALCIUM: 10.1 mg/dL (ref 8.6–10.4)
CO2: 28 mmol/L (ref 20–32)
CREATININE: 0.81 mg/dL (ref 0.50–0.99)
Chloride: 102 mmol/L (ref 98–110)
GFR, EST AFRICAN AMERICAN: 90 mL/min/{1.73_m2} (ref >=60)
GFR, EST NON AFRICAN AMERICAN: 78 mL/min/{1.73_m2} (ref >=60)
GLOBULIN: 2.7 g/dL (ref 1.9–3.7)
Glucose, Bld: 92 mg/dL (ref 65–139)
Potassium: 4.3 mmol/L (ref 3.5–5.3)
Sodium: 139 mmol/L (ref 135–146)
TOTAL PROTEIN: 7.3 g/dL (ref 6.1–8.1)
Total Bilirubin: 0.3 mg/dL (ref 0.2–1.2)

## 2017-03-20 LAB — VITAMIN D 25 HYDROXY (VIT D DEFICIENCY, FRACTURES): VIT D 25 HYDROXY: 21 ng/mL — AB (ref 30–100)

## 2017-03-20 LAB — TSH: TSH: 1.59 mIU/L (ref 0.40–4.50)

## 2017-03-20 MED ORDER — VITAMIN D (ERGOCALCIFEROL) 1.25 MG (50000 UNIT) PO CAPS: 50000.0000 [IU] | ORAL_CAPSULE | ORAL | 0 refills | Status: DC

## 2017-06-16 ENCOUNTER — Ambulatory Visit: Payer: 59 | Admitting: Family Medicine

## 2017-06-16 ENCOUNTER — Encounter: Payer: Self-pay | Admitting: Nurse Practitioner

## 2017-06-16 ENCOUNTER — Ambulatory Visit (INDEPENDENT_AMBULATORY_CARE_PROVIDER_SITE_OTHER): Payer: 59 | Admitting: Nurse Practitioner

## 2017-06-16 VITALS — BP 130/72 | HR 81 | Temp 98.4°F | Resp 16 | Ht 59.0 in | Wt 194.0 lb

## 2017-06-16 DIAGNOSIS — Z1212 Encounter for screening for malignant neoplasm of rectum: Secondary | ICD-10-CM | POA: Diagnosis not present

## 2017-06-16 DIAGNOSIS — E559 Vitamin D deficiency, unspecified: Secondary | ICD-10-CM

## 2017-06-16 DIAGNOSIS — Z1211 Encounter for screening for malignant neoplasm of colon: Secondary | ICD-10-CM

## 2017-06-16 DIAGNOSIS — E785 Hyperlipidemia, unspecified: Secondary | ICD-10-CM

## 2017-06-16 DIAGNOSIS — E669 Obesity, unspecified: Secondary | ICD-10-CM

## 2017-06-16 DIAGNOSIS — E119 Type 2 diabetes mellitus without complications: Secondary | ICD-10-CM

## 2017-06-16 DIAGNOSIS — I10 Essential (primary) hypertension: Secondary | ICD-10-CM

## 2017-06-16 LAB — POCT GLYCOSYLATED HEMOGLOBIN (HGB A1C): Hemoglobin A1C: 7.7

## 2017-06-16 MED ORDER — SEMAGLUTIDE(0.25 OR 0.5MG/DOS) 2 MG/1.5ML ~~LOC~~ SOPN: 0.2500 mg | PEN_INJECTOR | SUBCUTANEOUS | 0 refills | Status: DC

## 2017-06-16 MED ORDER — BUMETANIDE 0.5 MG PO TABS: 0.5000 mg | ORAL_TABLET | Freq: Two times a day (BID) | ORAL | 0 refills | Status: DC

## 2017-06-16 MED ORDER — BENAZEPRIL HCL 40 MG PO TABS: ORAL_TABLET | ORAL | 0 refills | Status: DC

## 2017-06-16 MED ORDER — PIOGLITAZONE HCL 30 MG PO TABS: 30.0000 mg | ORAL_TABLET | Freq: Every day | ORAL | 0 refills | Status: DC

## 2017-06-16 MED ORDER — PROPRANOLOL HCL 10 MG PO TABS: 10.0000 mg | ORAL_TABLET | Freq: Two times a day (BID) | ORAL | 0 refills | Status: DC

## 2017-06-16 MED ORDER — VITAMIN D 50 MCG (2000 UT) PO CAPS: 1.0000 | ORAL_CAPSULE | Freq: Every day | ORAL | 1 refills | Status: AC

## 2017-06-16 MED ORDER — SIMVASTATIN 40 MG PO TABS
ORAL_TABLET | ORAL | 1 refills | Status: DC
Start: 2017-06-16 — End: 2017-07-21

## 2017-06-16 MED ORDER — GLYBURIDE 5 MG PO TABS
5.0000 mg | ORAL_TABLET | Freq: Two times a day (BID) | ORAL | 0 refills | Status: DC
Start: 2017-06-16 — End: 2017-07-21

## 2017-06-16 MED ORDER — METFORMIN HCL 1000 MG PO TABS
1000.0000 mg | ORAL_TABLET | Freq: Two times a day (BID) | ORAL | 0 refills | Status: DC
Start: 2017-06-16 — End: 2017-07-21

## 2017-06-16 NOTE — Progress Notes (Addendum)
Name: Tracey Tucker   MRN: [id number]    DOB: [birth year]   Date:06/16/2017       Progress Note  Subjective  Chief Complaint  Chief Complaint  Patient presents with  . Diabetes    3 month follow up medication refills  . Hyperlipidemia  . Hypertension    HPI  Hypertension Pt on benazepril , propranolol  and bumetanide  daily. No missed doses. Denies chest pain, blurry vision, headaches. Doesn't check bp at home. No added salt to diet.   BP Readings from Last 3 Encounters:  06/16/17 130/72  03/19/17 126/82  03/13/17 128/86   Lab Results  Component Value Date   CREATININE 0.81 03/19/2017    Hyperlipidemia Pt on simvastatin  nightly, no missed doses in the last month. Denies myalgias   Lab Results  Component Value Date   CHOL 142 03/13/2017   HDL 63 03/13/2017   LDLCALC 65 03/13/2017   TRIG 61 03/13/2017   CHOLHDL 2.3 03/13/2017    Diabetes Patient on metformin , glyburide , actos  daily and januvia . Doesn't check blood sugars at home. Denies polyuria, polyphagia or polydipsia.  Diet: eats anything she wants to- 3 meals a day, usually has bacon biscuit or ham biscuit, lunch- whatever hospital serves, dinner- cooks pork chop, green peas, steak, potatoes. Eats vegetables everyday. Snacks- chips and candy bars.  Drinks diet sunkist- 2 sodas a day and 4 glasses of water.   Lab Results  Component Value Date   HGBA1C 7.7 (H) 03/13/2017    Patient Active Problem List   Diagnosis Date Noted  . Colon cancer screening 03/11/2016  . Acute non-recurrent maxillary sinusitis 12/19/2015  . Productive cough 12/19/2015  . Arthritis of left knee 09/18/2015  . Type 2 diabetes mellitus without complication (HCC) 08/22/2014  . Essential hypertension 08/22/2014  . Hyperlipidemia 08/22/2014  . Obesity (BMI 35.0-39.9 without comorbidity) 08/22/2014    Past Medical History:  Diagnosis Date  . Diabetes mellitus without complication (HCC)   .  Hypercholesteremia   . Hypertension   . Osteoporosis   . Seasonal allergies   . Wears dentures    full upper, partial lower    Past Surgical History:  Procedure Laterality Date  . CATARACT EXTRACTION Left 05/17/14   [facility] [place]  . CATARACT EXTRACTION W/PHACO Right 07/26/2014   Procedure: CATARACT EXTRACTION PHACO AND INTRAOCULAR LENS PLACEMENT (IOC);  Surgeon: Lockie Mola, MD;  Location: [facility];  Service: Ophthalmology;  Laterality: Right;  DIABETIC  . CESAREAN SECTION    . FOOT SURGERY  2014    Social History   Tobacco Use  . Smoking status: Current Every Day Smoker    Packs/day: 1.00    Years: 20.00    Pack years: 20.00    Types: Cigarettes  . Smokeless tobacco: Never Used  Substance Use Topics  . Alcohol use: No    Alcohol/week: 0.0 oz     Current Outpatient Medications:  .  aspirin 81 MG tablet, Take 81 mg by mouth daily. PM, Disp: , Rfl:  .  benazepril (LOTENSIN) 40 MG tablet, TAKE 1 TABLET (40 MG TOTAL) BY MOUTH DAILY IN THE MORNING, Disp: 90 tablet, Rfl: 0 .  bumetanide (BUMEX) 0.5 MG tablet, Take 1 tablet (0.5 mg total) by mouth 2 (two) times daily., Disp: 180 tablet, Rfl: 0 .  glyBURIDE (DIABETA) 5 MG tablet, Take 1 tablet (5 mg total) by mouth 2 (two) times daily with a meal., Disp: 180 tablet, Rfl: 0 .  metFORMIN (GLUCOPHAGE) 1000 MG tablet, Take 1 tablet (1,000 mg total) by mouth 2 (two) times daily., Disp: 180 tablet, Rfl: 0 .  pioglitazone (ACTOS) 30 MG tablet, Take 1 tablet (30 mg total) by mouth daily., Disp: 90 tablet, Rfl: 0 .  propranolol (INDERAL) 10 MG tablet, Take 1 tablet (10 mg total) by mouth 2 (two) times daily., Disp: 180 tablet, Rfl: 0 .  simvastatin (ZOCOR) 40 MG tablet, TAKE 1 TABLET BY MOUTH NIGHTLY AT BEDTIME, Disp: 90 tablet, Rfl: 1 .  sitaGLIPtin (JANUVIA) 100 MG tablet, Take 1 tablet (100 mg total) by mouth daily., Disp: 90 tablet, Rfl: 0 .  Vitamin D, Ergocalciferol, (DRISDOL) 50000 units CAPS capsule, Take 1  capsule (50,000 Units total) by mouth every 7 (seven) days. For 12 weeks, Disp: 12 capsule, Rfl: 0  No Known Allergies  ROS  Constitutional: Negative for fever or weight change.  Respiratory: Negative for cough and shortness of breath.   Cardiovascular: Negative for chest pain or palpitations.  Gastrointestinal: Negative for abdominal pain, no bowel changes.  Musculoskeletal: Negative for gait problem or joint swelling.  Skin: Negative for rash.  Neurological: Negative for dizziness or headache.  No other specific complaints in a complete review of systems (except as listed in HPI above).  Objective  Vitals:   06/16/17 1014  BP: 130/72  Pulse: 81  Resp: 16  Temp: 98.4 F (36.9 C)  TempSrc: Oral  SpO2: 96%  Weight: 194 lb (88 kg)  Height:  (1.499 m)     Body mass index is 39.18 kg/m.  Nursing Note and Vital Signs reviewed.  Physical Exam   Constitutional: Patient appears well-developed and well-nourished. Obese  No distress.  Cardiovascular: Normal rate, regular rhythm, S1/S2 present.   Pulmonary/Chest: Effort normal and breath sounds clear. No respiratory distress or retractions. Abdominal: Soft and non-tender, bowel sounds present  Psychiatric: Patient has a normal mood and affect. behavior is normal. Judgment and thought content normal.  No results found for this or any previous visit (from the past 72 hour(s)).  Assessment & Plan  1. Essential hypertension Stable cont meds, dash diet  - benazepril (LOTENSIN) 40 MG tablet; TAKE 1 TABLET (40 MG TOTAL) BY MOUTH DAILY IN THE MORNING  Dispense: 90 tablet; Refill: 0 - bumetanide (BUMEX) 0.5 MG tablet; Take 1 tablet (0.5 mg total) by mouth 2 (two) times daily.  Dispense: 180 tablet; Refill: 0 - propranolol (INDERAL) 10 MG tablet; Take 1 tablet (10 mg total) by mouth 2 (two) times daily.  Dispense: 180 tablet; Refill: 0  3. Obesity (BMI 35.0-39.9 without comorbidity) -discussed diet and exercise   4. Vitamin  D deficiency -recheck next visit  - Cholecalciferol (VITAMIN D) 2000 units CAPS; Take 1 capsule (2,000 Units total) by mouth daily.  Dispense: 90 capsule; Refill: 1  5. Type 2 diabetes mellitus without complication, without long-term current use of insulin (HCC) -discussed diet, portion control, increasing water intake, will start ozmepic stop DPP4, given sample will follow up in one month. Pt will monitor fasting sugars, inform us if hypoglycemia  - POCT HgB A1C - glyBURIDE (DIABETA) 5 MG tablet; Take 1 tablet (5 mg total) by mouth 2 (two) times daily with a meal.  Dispense: 180 tablet; Refill: 0 - metFORMIN (GLUCOPHAGE) 1000 MG tablet; Take 1 tablet (1,000 mg total) by mouth 2 (two) times daily.  Dispense: 180 tablet; Refill: 0 - pioglitazone (ACTOS) 30 MG tablet; Take 1 tablet (30 mg total) by mouth daily.  Dispense: 90  tablet; Refill: 0 - Semaglutide (OZEMPIC) 0.25 or 0.5 MG/DOSE SOPN; Inject 0.25 mg into the skin once a week.  Dispense: 1 pen; Refill: 0  6. Hyperlipidemia, unspecified hyperlipidemia type diet - simvastatin (ZOCOR) 40 MG tablet; TAKE 1 TABLET BY MOUTH NIGHTLY AT BEDTIME  Dispense: 90 tablet; Refill: 1  7. Screening for colorectal cancer Will check on cologuard   Follow up in one month- pt started on ozempic, ensure no hypoglycemic events will take off sulfonylurea if needed and further discuss diet and hypoglycemic management    -Red flags and when to present for emergency care or RTC including fever >101.73F, chest pain, shortness of breath, new/worsening/un-resolving symptoms,  reviewed with patient at time of visit. Follow up and care instructions discussed and provided in AVS. -Reviewed Health Maintenance: pt never received cologuard, will have staff call  ---------------------------------------- I have reviewed this encounter including the documentation in this note and/or discussed this patient with the provider, Sharyon Cable DNP AGNP-C. I am certifying that I  agree with the content of this note as supervising physician. Baruch Gouty, MD [facility] 07/02/2017, 5:10 PM

## 2017-06-16 NOTE — Patient Instructions (Addendum)
- My goal is for you to drink at least 8 glasses of water a day. This week try drinking 5 glasses then go up to 6 the following week until you get to 8 glasses - We are adding you on a GLP-1 for you diabetes called ozempic. You will start with 0.25mg  once a week for 4 weeks and then go up to 0.5mg  once a week for 4 weeks. I want to follow-up with you in a month to see how you are doing with this. - I want you to monitor your fasting blood sugar (before you eat- first thing in the morning) at least once a week but ideally twice and our goal is to be between 70- 155.  - We are going to stop taking the [place] (sitagliptan)  - If you ever feel very hungry, angry, shakey, and sweaty your blood sugar might be too low, so I want your to check your blood sugar and if its low eat a snack and make sure to let us know.   Diabetes Mellitus and Nutrition When you have diabetes (diabetes mellitus), it is very important to have healthy eating habits because your blood sugar (glucose) levels are greatly affected by what you eat and drink. Eating healthy foods in the appropriate amounts, at about the same times every day, can help you:  Control your blood glucose.  Lower your risk of heart disease.  Improve your blood pressure.  Reach or maintain a healthy weight.  Every person with diabetes is different, and each person has different needs for a meal plan. Your health care provider may recommend that you work with a diet and nutrition specialist (dietitian) to make a meal plan that is best for you. Your meal plan may vary depending on factors such as:  The calories you need.  The medicines you take.  Your weight.  Your blood glucose, blood pressure, and cholesterol levels.  Your activity level.  Other health conditions you have, such as heart or kidney disease.  How do carbohydrates affect me? Carbohydrates affect your blood glucose level more than any other type of food. Eating carbohydrates  naturally increases the amount of glucose in your blood. Carbohydrate counting is a method for keeping track of how many carbohydrates you eat. Counting carbohydrates is important to keep your blood glucose at a healthy level, especially if you use insulin or take certain oral diabetes medicines. It is important to know how many carbohydrates you can safely have in each meal. This is different for every person. Your dietitian can help you calculate how many carbohydrates you should have at each meal and for snack. Foods that contain carbohydrates include:  Bread, cereal, rice, pasta, and crackers.  Potatoes and corn.  Peas, beans, and lentils.  Milk and yogurt.  Fruit and juice.  Desserts, such as cakes, cookies, ice cream, and candy.  How does alcohol affect me? Alcohol can cause a sudden decrease in blood glucose (hypoglycemia), especially if you use insulin or take certain oral diabetes medicines. Hypoglycemia can be a life-threatening condition. Symptoms of hypoglycemia (sleepiness, dizziness, and confusion) are similar to symptoms of having too much alcohol. If your health care provider says that alcohol is safe for you, follow these guidelines:  Limit alcohol intake to no more than 1 drink per day for nonpregnant women and 2 drinks per day for men. One drink equals 12 oz of beer, 5 oz of wine, or 1 oz of hard liquor.  Do not drink on  an empty stomach.  Keep yourself hydrated with water, diet soda, or unsweetened iced tea.  Keep in mind that regular soda, juice, and other mixers may contain a lot of sugar and must be counted as carbohydrates.  What are tips for following this plan? Reading food labels  Start by checking the serving size on the label. The amount of calories, carbohydrates, fats, and other nutrients listed on the label are based on one serving of the food. Many foods contain more than one serving per package.  Check the total grams (g) of carbohydrates in one  serving. You can calculate the number of servings of carbohydrates in one serving by dividing the total carbohydrates by 15. For example, if a food has 30 g of total carbohydrates, it would be equal to 2 servings of carbohydrates.  Check the number of grams (g) of saturated and trans fats in one serving. Choose foods that have low or no amount of these fats.  Check the number of milligrams (mg) of sodium in one serving. Most people should limit total sodium intake to less than 2,300 mg per day.  Always check the nutrition information of foods labeled as "low-fat" or "nonfat". These foods may be higher in added sugar or refined carbohydrates and should be avoided.  Talk to your dietitian to identify your daily goals for nutrients listed on the label. Shopping  Avoid buying canned, premade, or processed foods. These foods tend to be high in fat, sodium, and added sugar.  Shop around the outside edge of the grocery store. This includes fresh fruits and vegetables, bulk grains, fresh meats, and fresh dairy. Cooking  Use low-heat cooking methods, such as baking, instead of high-heat cooking methods like deep frying.  Cook using healthy oils, such as olive, canola, or sunflower oil.  Avoid cooking with butter, cream, or high-fat meats. Meal planning  Eat meals and snacks regularly, preferably at the same times every day. Avoid going long periods of time without eating.  Eat foods high in fiber, such as fresh fruits, vegetables, beans, and whole grains. Talk to your dietitian about how many servings of carbohydrates you can eat at each meal.  Eat 4-6 ounces of lean protein each day, such as lean meat, chicken, fish, eggs, or tofu. 1 ounce is equal to 1 ounce of meat, chicken, or fish, 1 egg, or 1/4 cup of tofu.  Eat some foods each day that contain healthy fats, such as avocado, nuts, seeds, and fish. Lifestyle   Check your blood glucose regularly.  Exercise at least 30 minutes 5 or more  days each week, or as told by your health care provider.  Take medicines as told by your health care provider.  Do not use any products that contain nicotine or tobacco, such as cigarettes and e-cigarettes. If you need help quitting, ask your health care provider.  Work with a Veterinary surgeon or diabetes educator to identify strategies to manage stress and any emotional and social challenges. What are some questions to ask my health care provider?  Do I need to meet with a diabetes educator?  Do I need to meet with a dietitian?  What number can I call if I have questions?  When are the best times to check my blood glucose? Where to find more information:  American Diabetes Association: diabetes.org/food-and-fitness/food  Academy of Nutrition and Dietetics: https://www.vargas.com/  [employer] of Diabetes and Digestive and Kidney Diseases (NIH): FindJewelers.cz Summary  A healthy meal plan will help you control  your blood glucose and maintain a healthy lifestyle.  Working with a diet and nutrition specialist (dietitian) can help you make a meal plan that is best for you.  Keep in mind that carbohydrates and alcohol have immediate effects on your blood glucose levels. It is important to count carbohydrates and to use alcohol carefully. This information is not intended to replace advice given to you by your health care provider. Make sure you discuss any questions you have with your health care provider. Document Released: 10/24/2004 Document Revised: 03/03/2016 Document Reviewed: 03/03/2016 Elsevier Interactive Patient Education  [employer].

## 2017-06-25 ENCOUNTER — Ambulatory Visit (INDEPENDENT_AMBULATORY_CARE_PROVIDER_SITE_OTHER): Payer: Self-pay | Admitting: Medical

## 2017-06-25 ENCOUNTER — Encounter: Payer: Self-pay | Admitting: Medical

## 2017-06-25 VITALS — BP 124/80 | HR 88 | Temp 98.3°F | Wt 192.0 lb

## 2017-06-25 DIAGNOSIS — R05 Cough: Secondary | ICD-10-CM

## 2017-06-25 DIAGNOSIS — J069 Acute upper respiratory infection, unspecified: Secondary | ICD-10-CM

## 2017-06-25 DIAGNOSIS — R059 Cough, unspecified: Secondary | ICD-10-CM

## 2017-06-25 MED ORDER — AMOXICILLIN-POT CLAVULANATE 875-125 MG PO TABS: 1.0000 | ORAL_TABLET | Freq: Two times a day (BID) | ORAL | 0 refills | Status: DC

## 2017-06-25 MED ORDER — BENZONATATE 100 MG PO CAPS: ORAL_CAPSULE | ORAL | 0 refills | Status: DC

## 2017-06-25 MED ORDER — ALBUTEROL SULFATE HFA 108 (90 BASE) MCG/ACT IN AERS: 2.0000 | INHALATION_SPRAY | Freq: Four times a day (QID) | RESPIRATORY_TRACT | 0 refills | Status: DC | PRN

## 2017-06-25 NOTE — Patient Instructions (Signed)
Cough, Adult A cough helps to clear your throat and lungs. A cough may last only 2-3 weeks (acute), or it may last longer than 8 weeks (chronic). Many different things can cause a cough. A cough may be a sign of an illness or another medical condition. Follow these instructions at home:  Pay attention to any changes in your cough.  Take medicines only as told by your doctor. ? If you were prescribed an antibiotic medicine, take it as told by your doctor. Do not stop taking it even if you start to feel better. ? Talk with your doctor before you try using a cough medicine.  Drink enough fluid to keep your pee (urine) clear or pale yellow.  If the air is dry, use a cold steam vaporizer or humidifier in your home.  Stay away from things that make you cough at work or at home.  If your cough is worse at night, try using extra pillows to raise your head up higher while you sleep.  Do not smoke, and try not to be around smoke. If you need help quitting, ask your doctor.  Do not have caffeine.  Do not drink alcohol.  Rest as needed. Contact a doctor if:  You have new problems (symptoms).  You cough up yellow fluid (pus).  Your cough does not get better after 2-3 weeks, or your cough gets worse.  Medicine does not help your cough and you are not sleeping well.  You have pain that gets worse or pain that is not helped with medicine.  You have a fever.  You are losing weight and you do not know why.  You have night sweats. Get help right away if:  You cough up blood.  You have trouble breathing.  Your heartbeat is very fast. This information is not intended to replace advice given to you by your health care provider. Make sure you discuss any questions you have with your health care provider. Document Released: 10/10/2010 Document Revised: 07/05/2015 Document Reviewed: 04/05/2014 Elsevier Interactive Patient Education  2018 Elsevier Inc. Upper Respiratory Infection,  Adult Most upper respiratory infections (URIs) are caused by a virus. A URI affects the nose, throat, and upper air passages. The most common type of URI is often called "the common cold." Follow these instructions at home:  Take medicines only as told by your doctor.  Gargle warm saltwater or take cough drops to comfort your throat as told by your doctor.  Use a warm mist humidifier or inhale steam from a shower to increase air moisture. This may make it easier to breathe.  Drink enough fluid to keep your pee (urine) clear or pale yellow.  Eat soups and other clear broths.  Have a healthy diet.  Rest as needed.  Go back to work when your fever is gone or your doctor says it is okay. ? You may need to stay home longer to avoid giving your URI to others. ? You can also wear a face mask and wash your hands often to prevent spread of the virus.  Use your inhaler more if you have asthma.  Do not use any tobacco products, including cigarettes, chewing tobacco, or electronic cigarettes. If you need help quitting, ask your doctor. Contact a doctor if:  You are getting worse, not better.  Your symptoms are not helped by medicine.  You have chills.  You are getting more short of breath.  You have brown or red mucus.  You have yellow or   brown discharge from your nose.  You have pain in your face, especially when you bend forward.  You have a fever.  You have puffy (swollen) neck glands.  You have pain while swallowing.  You have white areas in the back of your throat. Get help right away if:  You have very bad or constant: ? Headache. ? Ear pain. ? Pain in your forehead, behind your eyes, and over your cheekbones (sinus pain). ? Chest pain.  You have long-lasting (chronic) lung disease and any of the following: ? Wheezing. ? Long-lasting cough. ? Coughing up blood. ? A change in your usual mucus.  You have a stiff neck.  You have changes in  your: ? Vision. ? Hearing. ? Thinking. ? Mood. This information is not intended to replace advice given to you by your health care provider. Make sure you discuss any questions you have with your health care provider. Document Released: 07/16/2007 Document Revised: 09/30/2015 Document Reviewed: 05/04/2013 Elsevier Interactive Patient Education  2018 Elsevier Inc.  

## 2017-06-25 NOTE — Progress Notes (Signed)
   Subjective:    Patient ID: Tracey Tucker, female    DOB: Mar 12, 1955, 62 y.o.   MRN: [id number]  HPI 62 yo female in non acute distress. Presents to day with complaints starting Sunday with chest congestion, cough non productive, chills and body aches.  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 Seen by her doctor last Tuesday at [facility] seen by NP cannot recall name. Smoker4 cigarettes per day , also does snuff. complains of shortness of breath and  rhinorhea clear . Called primary and patient felt she could no make it another day.  Review of Systems  Constitutional: Positive for chills and fever (did not take it). Negative for fatigue.  HENT: Positive for congestion, sore throat and trouble swallowing (due to a little soreness). Negative for ear pain.   Eyes: Negative for discharge, itching and visual disturbance.  Respiratory: Positive for cough, shortness of breath and wheezing.   Cardiovascular: Negative for chest pain and leg swelling.  Gastrointestinal: Negative for abdominal pain.  Endocrine: Negative for polydipsia, polyphagia and polyuria.  Genitourinary: Negative for dysuria.  Musculoskeletal: Positive for myalgias. Negative for arthralgias.  Skin: Negative for rash.  Allergic/Immunologic: Negative for environmental allergies and food allergies.  Neurological: Negative for dizziness, speech difficulty and light-headedness.  Hematological: Negative for adenopathy.  Psychiatric/Behavioral: Negative for behavioral problems, self-injury and suicidal ideas. The patient is not nervous/anxious.        Objective:   Physical Exam  Constitutional: She is oriented to person, place, and time. She appears well-developed and well-nourished.  HENT:  Head: Normocephalic and atraumatic.  Eyes: Pupils are equal, round, and reactive to light. EOM are normal.  Neck: Normal range of motion. Neck supple.  Cardiovascular: Normal  rate, regular rhythm and normal heart sounds. Exam reveals no gallop and no friction rub.  No murmur heard. Pulmonary/Chest: Effort normal. No respiratory distress. She has no wheezes. She has rales (cleared with cough).  Neurological: She is alert and oriented to person, place, and time.  Skin: Skin is warm and dry.  Psychiatric: She has a normal mood and affect. Her behavior is normal. Judgment and thought content normal.   No shortness of breath on exam, no accessory muscle use, able to talk in complete sentences.       Assessment & Plan:  Upper respiratory infection, cough Meds ordered this encounter  Medications  . amoxicillin-clavulanate (AUGMENTIN) 875-125 MG tablet    Sig: Take 1 tablet by mouth 2 (two) times daily.    Dispense:  20 tablet    Refill:  0  . benzonatate (TESSALON) 100 MG capsule    Sig: 1-2 perles every 8 hours as needed for cough    Dispense:  30 capsule    Refill:  0  . albuterol (PROVENTIL HFA;VENTOLIN HFA) 108 (90 Base) MCG/ACT inhaler    Sig: Inhale 2 puffs into the lungs every 6 (six) hours as needed for wheezing or shortness of breath.    Dispense:  1 Inhaler    Refill:  0  Rest , increase fluids like water.  Can take plain Mucinex. Stop Mucinex DM. Return in  3-5 days if not improving on medication. Patient verbalizes understanding and has no questions at discharge.

## 2017-06-29 ENCOUNTER — Telehealth: Payer: Self-pay | Admitting: Emergency Medicine

## 2017-06-29 NOTE — Telephone Encounter (Signed)
Left message following up on visit with [facility] 

## 2017-07-21 ENCOUNTER — Encounter: Payer: Self-pay | Admitting: Nurse Practitioner

## 2017-07-21 ENCOUNTER — Ambulatory Visit: Payer: 59 | Admitting: Nurse Practitioner

## 2017-07-21 VITALS — BP 126/82 | HR 95 | Temp 98.4°F | Resp 16 | Ht 59.0 in | Wt 190.9 lb

## 2017-07-21 DIAGNOSIS — E785 Hyperlipidemia, unspecified: Secondary | ICD-10-CM

## 2017-07-21 DIAGNOSIS — E559 Vitamin D deficiency, unspecified: Secondary | ICD-10-CM | POA: Diagnosis not present

## 2017-07-21 DIAGNOSIS — I10 Essential (primary) hypertension: Secondary | ICD-10-CM | POA: Diagnosis not present

## 2017-07-21 DIAGNOSIS — E1165 Type 2 diabetes mellitus with hyperglycemia: Secondary | ICD-10-CM | POA: Diagnosis not present

## 2017-07-21 LAB — GLUCOSE, POCT (MANUAL RESULT ENTRY): POC GLUCOSE: 133 mg/dL — AB (ref 70–99)

## 2017-07-21 MED ORDER — BENAZEPRIL HCL 40 MG PO TABS: ORAL_TABLET | ORAL | 0 refills | Status: DC

## 2017-07-21 MED ORDER — BLOOD GLUCOSE MONITOR KIT: PACK | 0 refills | Status: DC

## 2017-07-21 MED ORDER — BUMETANIDE 0.5 MG PO TABS: 0.5000 mg | ORAL_TABLET | Freq: Two times a day (BID) | ORAL | 0 refills | Status: DC

## 2017-07-21 MED ORDER — PIOGLITAZONE HCL 30 MG PO TABS: 30.0000 mg | ORAL_TABLET | Freq: Every day | ORAL | 0 refills | Status: DC

## 2017-07-21 MED ORDER — SEMAGLUTIDE(0.25 OR 0.5MG/DOS) 2 MG/1.5ML ~~LOC~~ SOPN: 0.5000 mg | PEN_INJECTOR | SUBCUTANEOUS | 2 refills | Status: DC

## 2017-07-21 MED ORDER — PROPRANOLOL HCL 10 MG PO TABS: 10.0000 mg | ORAL_TABLET | Freq: Two times a day (BID) | ORAL | 0 refills | Status: DC

## 2017-07-21 MED ORDER — GLYBURIDE 5 MG PO TABS: 5.0000 mg | ORAL_TABLET | Freq: Two times a day (BID) | ORAL | 0 refills | Status: DC

## 2017-07-21 MED ORDER — SIMVASTATIN 40 MG PO TABS: ORAL_TABLET | ORAL | 1 refills | Status: DC

## 2017-07-21 MED ORDER — METFORMIN HCL 1000 MG PO TABS: 1000.0000 mg | ORAL_TABLET | Freq: Two times a day (BID) | ORAL | 0 refills | Status: DC

## 2017-07-21 NOTE — Patient Instructions (Addendum)
- Please send cologuard in by 07/27/2017 - Please come in on 07/28/2017 for labs only appointment to get -Vitamin D levels checked -  increasing ozempic dose form 0.25 to 0.'5mg'$  a week. Continuing healthy diet with lots of vegetables and drinking lots of water - Check FASTING blood sugars (before eating anything) 3 times a week. Our goal for your blood sugar is to be between 80-140.  - Keep the great work!!  Hypoglycemia Hypoglycemia is when the sugar (glucose) level in the blood is too low. Symptoms of low blood sugar may include:  Feeling: ? Hungry. ? Worried or nervous (anxious). ? Sweaty and clammy. ? Confused. ? Dizzy. ? Sleepy. ? Sick to your stomach (nauseous).  Having: ? A fast heartbeat. ? A headache. ? A change in your vision. ? Jerky movements that you cannot control (seizure). ? Nightmares. ? Tingling or no feeling (numbness) around the mouth, lips, or tongue.  Having trouble with: ? Talking. ? Paying attention (concentrating). ? Moving (coordination). ? Sleeping.  Shaking.  Passing out (fainting).  Getting upset easily (irritability).  Low blood sugar can happen to people who have diabetes and people who do not have diabetes. Low blood sugar can happen quickly, and it can be an emergency. Treating Low Blood Sugar Low blood sugar is often treated by eating or drinking something sugary right away. If you can think clearly and swallow safely, follow the 15:15 rule:  Take 15 grams of a fast-acting carb (carbohydrate). Some fast-acting carbs are: ? 1 tube of glucose gel. ? 3 sugar tablets (glucose pills). ? 6-8 pieces of hard candy. ? 4 oz (120 mL) of fruit juice. ? 4 oz (120 mL) of regular (not diet) soda.  Check your blood sugar 15 minutes after you take the carb.  If your blood sugar is still at or below 70 mg/dL (3.9 mmol/L), take 15 grams of a carb again.  If your blood sugar does not go above 70 mg/dL (3.9 mmol/L) after 3 tries, get help right  away.  After your blood sugar goes back to normal, eat a meal or a snack within 1 hour.  Treating Very Low Blood Sugar If your blood sugar is at or below 54 mg/dL (3 mmol/L), you have very low blood sugar (severe hypoglycemia). This is an emergency. Do not wait to see if the symptoms will go away. Get medical help right away. Call your local emergency services (911 in the [place] Do not drive yourself to the hospital. If you have very low blood sugar and you cannot eat or drink, you may need a glucagon shot (injection). A family member or friend should learn how to check your blood sugar and how to give you a glucagon shot. Ask your doctor if you need to have a glucagon shot kit at home. Follow these instructions at home: General instructions  Avoid any diets that cause you to not eat enough food. Talk with your doctor before you start any new diet.  Take over-the-counter and prescription medicines only as told by your doctor.  Limit alcohol to no more than 1 drink per day for nonpregnant women and 2 drinks per day for men. One drink equals 12 oz of beer, 5 oz of wine, or 1 oz of hard liquor.  Keep all follow-up visits as told by your doctor. This is important. If You Have Diabetes:   Make sure you know the symptoms of low blood sugar.  Always keep a source of sugar with you,  such as: ? Sugar. ? Sugar tablets. ? Glucose gel. ? Fruit juice. ? Regular soda (not diet soda). ? Milk. ? Hard candy. ? Honey.  Take your medicines as told.  Follow your exercise and meal plan. ? Eat on time. Do not skip meals. ? Follow your sick day plan when you cannot eat or drink normally. Make this plan ahead of time with your doctor.  Check your blood sugar as often as told by your doctor. Always check before and after exercise.  Share your diabetes care plan with: ? Your work or school. ? People you live with.  Check your pee (urine) for ketones: ? When you are sick. ? As told by your  doctor.  Carry a card or wear jewelry that says you have diabetes. If You Have Low Blood Sugar From Other Causes:   Check your blood sugar as often as told by your doctor.  Follow instructions from your doctor about what you cannot eat or drink. Contact a doctor if:  You have trouble keeping your blood sugar in your target range.  You have low blood sugar often. Get help right away if:  You still have symptoms after you eat or drink something sugary.  Your blood sugar is at or below 54 mg/dL (3 mmol/L).  You have jerky movements that you cannot control.  You pass out. These symptoms may be an emergency. Do not wait to see if the symptoms will go away. Get medical help right away. Call your local emergency services (911 in the [place] Do not drive yourself to the hospital. This information is not intended to replace advice given to you by your health care provider. Make sure you discuss any questions you have with your health care provider. Document Released: 04/23/2009 Document Revised: 07/05/2015 Document Reviewed: 03/02/2015 Elsevier Interactive Patient Education  2018 [place] with Quitting Smoking Quitting smoking is a physical and mental challenge. You will face cravings, withdrawal symptoms, and temptation. Before quitting, work with your health care provider to make a plan that can help you cope. Preparation can help you quit and keep you from giving in. How can I cope with cravings? Cravings usually last for 5-10 minutes. If you get through it, the craving will pass. Consider taking the following actions to help you cope with cravings:  Keep your mouth busy: ? Chew sugar-free gum. ? Suck on hard candies or a straw. ? Brush your teeth.  Keep your hands and body busy: ? Immediately change to a different activity when you feel a craving. ? Squeeze or play with a ball. ? Do an activity or a hobby, like making bead jewelry, practicing needlepoint, or working  with wood. ? Mix up your normal routine. ? Take a short exercise break. Go for a quick walk or run up and down stairs. ? Spend time in public places where smoking is not allowed.  Focus on doing something kind or helpful for someone else.  Call a friend or family member to talk during a craving.  Join a support group.  Call a quit line, such as 1-800-QUIT-NOW.  Talk with your health care provider about medicines that might help you cope with cravings and make quitting easier for you.  How can I deal with withdrawal symptoms? Your body may experience negative effects as it tries to get used to not having nicotine in the system. These effects are called withdrawal symptoms. They may include:  Feeling hungrier than normal.  Trouble  concentrating.  Irritability.  Trouble sleeping.  Feeling depressed.  Restlessness and agitation.  Craving a cigarette.  To manage withdrawal symptoms:  Avoid places, people, and activities that trigger your cravings.  Remember why you want to quit.  Get plenty of sleep.  Avoid coffee and other caffeinated drinks. These may worsen some of your symptoms.  How can I handle social situations? Social situations can be difficult when you are quitting smoking, especially in the first few weeks. To manage this, you can:  Avoid parties, bars, and other social situations where people might be smoking.  Avoid alcohol.  Leave right away if you have the urge to smoke.  Explain to your family and friends that you are quitting smoking. Ask for understanding and support.  Plan activities with friends or family where smoking is not an option.  What are some ways I can cope with stress? Wanting to smoke may cause stress, and stress can make you want to smoke. Find ways to manage your stress. Relaxation techniques can help. For example:  Breathe slowly and deeply, in through your nose and out through your mouth.  Listen to soothing, relaxing  music.  Talk with a family member or friend about your stress.  Light a candle.  Soak in a bath or take a shower.  Think about a peaceful place.  What are some ways I can prevent weight gain? Be aware that many people gain weight after they quit smoking. However, not everyone does. To keep from gaining weight, have a plan in place before you quit and stick to the plan after you quit. Your plan should include:  Having healthy snacks. When you have a craving, it may help to: ? Eat plain popcorn, crunchy carrots, celery, or other cut vegetables. ? Chew sugar-free gum.  Changing how you eat: ? Eat small portion sizes at meals. ? Eat 4-6 small meals throughout the day instead of 1-2 large meals a day. ? Be mindful when you eat. Do not watch television or do other things that might distract you as you eat.  Exercising regularly: ? Make time to exercise each day. If you do not have time for a long workout, do short bouts of exercise for 5-10 minutes several times a day. ? Do some form of strengthening exercise, like weight lifting, and some form of aerobic exercise, like running or swimming.  Drinking plenty of water or other low-calorie or no-calorie drinks. Drink 6-8 glasses of water daily, or as much as instructed by your health care provider.  Summary  Quitting smoking is a physical and mental challenge. You will face cravings, withdrawal symptoms, and temptation to smoke again. Preparation can help you as you go through these challenges.  You can cope with cravings by keeping your mouth busy (such as by chewing gum), keeping your body and hands busy, and making calls to family, friends, or a helpline for people who want to quit smoking.  You can cope with withdrawal symptoms by avoiding places where people smoke, avoiding drinks with caffeine, and getting plenty of rest.  Ask your health care provider about the different ways to prevent weight gain, avoid stress, and handle social  situations. This information is not intended to replace advice given to you by your health care provider. Make sure you discuss any questions you have with your health care provider. Document Released: 01/25/2016 Document Revised: 01/25/2016 Document Reviewed: 01/25/2016 Elsevier Interactive Patient Education  [employer].

## 2017-07-21 NOTE — Progress Notes (Addendum)
Name: Tracey Tucker   MRN: [id number]    DOB: 1955-09-09   Date:07/21/2017       Progress Note  Subjective  Chief Complaint  Chief Complaint  Patient presents with  . Follow-up    HPI  Diabetes Mellitus Patient is rx metformin '1000mg'$  twice a day, actos '30mg'$  daily, ozempic 0.'25mg'$  weekly. Takes medications as prescribed with no missed doses a month.  Diet: eating a lot more vegetables, cut out lots of snacks- used to eat two bag of potato chips for breakfast but switched to banana sandwich and coffee. Eating more salads and drinking more water-up to 7 glasses of water a day now.  Doesn't check blood sugars Denies polyphagia, polydipsia. Endorses some polyuria - attributes to drinking more water and taking fluid pills.  Wt Readings from Last 3 Encounters:  07/21/17 190 lb 14.4 oz (86.6 kg)  06/25/17 192 lb (87.1 kg)  06/16/17 194 lb (88 kg)   Patient states after last visit noted she had vitamin D pills Dr. Manuella Ghazi had ordered that she never picked up so she started taking them once weekly.   Patient Active Problem List   Diagnosis Date Noted  . Colon cancer screening 03/11/2016  . Acute non-recurrent maxillary sinusitis 12/19/2015  . Productive cough 12/19/2015  . Arthritis of left knee 09/18/2015  . Type 2 diabetes mellitus without complication ([place]) 82/95/6213  . Essential hypertension 08/22/2014  . Hyperlipidemia 08/22/2014  . Obesity (BMI 35.0-39.9 without comorbidity) 08/22/2014    Past Medical History:  Diagnosis Date  . Diabetes mellitus without complication ([place])   . Hypercholesteremia   . Hypertension   . Osteoporosis   . Seasonal allergies   . Wears dentures    full upper, partial lower    Past Surgical History:  Procedure Laterality Date  . CATARACT EXTRACTION Left 05/17/14   [facility] [place]  . CATARACT EXTRACTION W/PHACO Right 07/26/2014   Procedure: CATARACT EXTRACTION PHACO AND INTRAOCULAR LENS PLACEMENT ([facility]);  Surgeon: Leandrew Koyanagi, MD;  Location:  [facility];  Service: Ophthalmology;  Laterality: Right;  DIABETIC  . CESAREAN SECTION    . FOOT SURGERY  2014    Social History   Tobacco Use  . Smoking status: Current Every Day Smoker    Packs/day: 1.00    Years: 20.00    Pack years: 20.00    Types: Cigarettes  . Smokeless tobacco: Never Used  Substance Use Topics  . Alcohol use: No    Alcohol/week: 0.0 oz     Current Outpatient Medications:  .  aspirin 81 MG tablet, Take 81 mg by mouth daily. PM, Disp: , Rfl:  .  benazepril (LOTENSIN) 40 MG tablet, TAKE 1 TABLET (40 MG TOTAL) BY MOUTH DAILY IN THE MORNING, Disp: 90 tablet, Rfl: 0 .  bumetanide (BUMEX) 0.5 MG tablet, Take 1 tablet (0.5 mg total) by mouth 2 (two) times daily., Disp: 180 tablet, Rfl: 0 .  Cholecalciferol (VITAMIN D) 2000 units CAPS, Take 1 capsule (2,000 Units total) by mouth daily., Disp: 90 capsule, Rfl: 1 .  glyBURIDE (DIABETA) 5 MG tablet, Take 1 tablet (5 mg total) by mouth 2 (two) times daily with a meal., Disp: 180 tablet, Rfl: 0 .  metFORMIN (GLUCOPHAGE) 1000 MG tablet, Take 1 tablet (1,000 mg total) by mouth 2 (two) times daily., Disp: 180 tablet, Rfl: 0 .  pioglitazone (ACTOS) 30 MG tablet, Take 1 tablet (30 mg total) by mouth daily., Disp: 90 tablet, Rfl: 0 .  propranolol (INDERAL) 10 MG  tablet, Take 1 tablet (10 mg total) by mouth 2 (two) times daily., Disp: 180 tablet, Rfl: 0 .  Semaglutide (OZEMPIC) 0.25 or 0.5 MG/DOSE SOPN, Inject 0.25 mg into the skin once a week., Disp: 1 pen, Rfl: 0 .  simvastatin (ZOCOR) 40 MG tablet, TAKE 1 TABLET BY MOUTH NIGHTLY AT BEDTIME, Disp: 90 tablet, Rfl: 1 .  Vitamin D, Ergocalciferol, (DRISDOL) 50000 units CAPS capsule, , Disp: , Rfl: 0 .  albuterol (PROVENTIL HFA;VENTOLIN HFA) 108 (90 Base) MCG/ACT inhaler, Inhale 2 puffs into the lungs every 6 (six) hours as needed for wheezing or shortness of breath. (Patient not taking: Reported on 07/21/2017), Disp: 1 Inhaler, Rfl: 0 .  amoxicillin-clavulanate (AUGMENTIN)  875-125 MG tablet, Take 1 tablet by mouth 2 (two) times daily. (Patient not taking: Reported on 07/21/2017), Disp: 20 tablet, Rfl: 0 .  benzonatate (TESSALON) 100 MG capsule, 1-2 perles every 8 hours as needed for cough (Patient not taking: Reported on 07/21/2017), Disp: 30 capsule, Rfl: 0  No Known Allergies  ROS  Constitutional: Negative for fever or weight change.  Respiratory: Negative for cough and shortness of breath.   Cardiovascular: Negative for chest pain or palpitations.  Gastrointestinal: Negative for abdominal pain, no bowel changes.  Musculoskeletal: Negative for gait problem or joint swelling.  Skin: Negative for rash.  Neurological: Negative for dizziness or headache.  No other specific complaints in a complete review of systems (except as listed in HPI above).  Objective  Vitals:   07/21/17 0826  BP: 126/82  Pulse: 95  Resp: 16  Temp: 98.4 F (36.9 C)  TempSrc: Oral  SpO2: 96%  Weight: 190 lb 14.4 oz (86.6 kg)  Height: '4\' 11"'$  (1.499 m)    Body mass index is 38.56 kg/m.  Nursing Note and Vital Signs reviewed.  Physical Exam   Constitutional: Patient appears well-developed and well-nourished. Obese No distress.  Cardiovascular: Normal rate, regular rhythm, S1/S2 present.   Pulmonary/Chest: Effort normal and breath sounds clear.  Abdominal: Soft and non-tender, bowel sounds present  Psychiatric: Patient has a normal mood and affect. behavior is normal. Judgment and thought content normal.  No results found for this or any previous visit (from the past 72 hour(s)).  Assessment & Plan  1. Type 2 diabetes mellitus with hyperglycemia, without long-term current use of insulin ([facility]) - increasing ozempic dose form 0.25 to 0.'5mg'$  a week. Continuing healthy diet with lots of vegetables and drinking lots of water.  - blood glucose meter kit and supplies KIT; Dispense based on patient and insurance preference. Check sugars once a day fasting. E11.69  Dispense: 1  each; Refill: 0 - POCT Glucose (CBG) - Semaglutide (OZEMPIC) 0.25 or 0.5 MG/DOSE SOPN; Inject 0.5 mg into the skin once a week.  Dispense: 4 pen; Refill: 2  2. Vitamin D deficiency -started vitamin D 5 weeks ago will recheck levels next week.  - Vitamin D (25 hydroxy); Future  3. Essential hypertension DASH - benazepril (LOTENSIN) 40 MG tablet; TAKE 1 TABLET (40 MG TOTAL) BY MOUTH DAILY IN THE MORNING  Dispense: 90 tablet; Refill: 0 - bumetanide (BUMEX) 0.5 MG tablet; Take 1 tablet (0.5 mg total) by mouth 2 (two) times daily.  Dispense: 180 tablet; Refill: 0 - propranolol (INDERAL) 10 MG tablet; Take 1 tablet (10 mg total) by mouth 2 (two) times daily.  Dispense: 180 tablet; Refill: 0  4. Hyperlipidemia, unspecified hyperlipidemia type Discussed diet.  - simvastatin (ZOCOR) 40 MG tablet; TAKE 1 TABLET BY MOUTH NIGHTLY AT  BEDTIME  Dispense: 90 tablet; Refill: 1 Face-to-face time with patient was more than 25 minutes, >50% time spent counseling and coordination of care  -Red flags and when to present for emergency care or RTC including fever >101.66F, chest pain, shortness of breath, new/worsening/un-resolving symptoms,  reviewed with patient at time of visit. Follow up and care instructions discussed and provided in AVS. -Reviewed Health Maintenance: plans to send in cologuard next week   ---------------------------------------- I have reviewed this encounter including the documentation in this note and/or discussed this patient with the provider, Suezanne Cheshire DNP AGNP-C. I am certifying that I agree with the content of this note as supervising physician. Enid Derry, [facility] 07/22/2017, 3:16 PM

## 2017-07-28 ENCOUNTER — Telehealth: Payer: Self-pay | Admitting: Emergency Medicine

## 2017-07-28 ENCOUNTER — Other Ambulatory Visit: Payer: Self-pay | Admitting: Nurse Practitioner

## 2017-07-28 DIAGNOSIS — E1165 Type 2 diabetes mellitus with hyperglycemia: Secondary | ICD-10-CM

## 2017-07-28 DIAGNOSIS — E559 Vitamin D deficiency, unspecified: Secondary | ICD-10-CM | POA: Diagnosis not present

## 2017-07-28 MED ORDER — SEMAGLUTIDE(0.25 OR 0.5MG/DOS) 2 MG/1.5ML ~~LOC~~ SOPN: 0.5000 mg | PEN_INJECTOR | SUBCUTANEOUS | 2 refills | Status: DC

## 2017-07-28 NOTE — Telephone Encounter (Signed)
Need script for Ozempic sent to Pharmacy.

## 2017-07-28 NOTE — Telephone Encounter (Signed)
Send script for Ozempic to pharmacy

## 2017-07-29 LAB — VITAMIN D 25 HYDROXY (VIT D DEFICIENCY, FRACTURES): Vit D, 25-Hydroxy: 45 ng/mL (ref 30–100)

## 2017-08-03 ENCOUNTER — Telehealth: Payer: Self-pay

## 2017-08-03 MED ORDER — DULAGLUTIDE 1.5 MG/0.5ML ~~LOC~~ SOAJ: 1.5000 mg | SUBCUTANEOUS | 2 refills | Status: DC

## 2017-08-03 NOTE — Telephone Encounter (Signed)
Insurance will not cover Ozempic until patient tries step therapy: Trulicity.

## 2017-09-20 DIAGNOSIS — Z1212 Encounter for screening for malignant neoplasm of rectum: Secondary | ICD-10-CM | POA: Diagnosis not present

## 2017-09-20 DIAGNOSIS — Z1211 Encounter for screening for malignant neoplasm of colon: Secondary | ICD-10-CM | POA: Diagnosis not present

## 2017-09-30 ENCOUNTER — Encounter: Payer: Self-pay | Admitting: Family Medicine

## 2017-09-30 ENCOUNTER — Other Ambulatory Visit: Payer: Self-pay | Admitting: Nurse Practitioner

## 2017-09-30 ENCOUNTER — Telehealth: Payer: Self-pay | Admitting: Nurse Practitioner

## 2017-09-30 LAB — COLOGUARD: COLOGUARD: NEGATIVE

## 2017-09-30 NOTE — Telephone Encounter (Signed)
Good news- Negative cologuard screening for colon cancer. Next recommended screening is in three years unless new or concerning issues arise.

## 2017-10-22 ENCOUNTER — Ambulatory Visit: Payer: 59 | Admitting: Family Medicine

## 2017-10-22 ENCOUNTER — Encounter: Payer: Self-pay | Admitting: Family Medicine

## 2017-10-22 VITALS — BP 124/84 | HR 72 | Temp 98.7°F | Resp 16 | Ht 59.0 in | Wt 180.7 lb

## 2017-10-22 DIAGNOSIS — Z716 Tobacco abuse counseling: Secondary | ICD-10-CM | POA: Diagnosis not present

## 2017-10-22 DIAGNOSIS — E7800 Pure hypercholesterolemia, unspecified: Secondary | ICD-10-CM

## 2017-10-22 DIAGNOSIS — E1165 Type 2 diabetes mellitus with hyperglycemia: Secondary | ICD-10-CM | POA: Diagnosis not present

## 2017-10-22 DIAGNOSIS — J209 Acute bronchitis, unspecified: Secondary | ICD-10-CM

## 2017-10-22 DIAGNOSIS — E669 Obesity, unspecified: Secondary | ICD-10-CM | POA: Diagnosis not present

## 2017-10-22 DIAGNOSIS — I10 Essential (primary) hypertension: Secondary | ICD-10-CM

## 2017-10-22 MED ORDER — BENAZEPRIL HCL 40 MG PO TABS: ORAL_TABLET | ORAL | 1 refills | Status: DC

## 2017-10-22 MED ORDER — DULAGLUTIDE 1.5 MG/0.5ML ~~LOC~~ SOAJ: 1.5000 mg | SUBCUTANEOUS | 2 refills | Status: DC

## 2017-10-22 MED ORDER — BUDESONIDE-FORMOTEROL FUMARATE 80-4.5 MCG/ACT IN AERO: 2.0000 | INHALATION_SPRAY | Freq: Two times a day (BID) | RESPIRATORY_TRACT | 1 refills | Status: DC

## 2017-10-22 MED ORDER — PROPRANOLOL HCL 10 MG PO TABS: 10.0000 mg | ORAL_TABLET | Freq: Two times a day (BID) | ORAL | 1 refills | Status: DC

## 2017-10-22 MED ORDER — ALBUTEROL SULFATE 108 (90 BASE) MCG/ACT IN AEPB: 2.0000 | INHALATION_SPRAY | Freq: Four times a day (QID) | RESPIRATORY_TRACT | 1 refills | Status: DC | PRN

## 2017-10-22 MED ORDER — PIOGLITAZONE HCL 30 MG PO TABS: 30.0000 mg | ORAL_TABLET | Freq: Every day | ORAL | 1 refills | Status: DC

## 2017-10-22 MED ORDER — BENZONATATE 100 MG PO CAPS: 100.0000 mg | ORAL_CAPSULE | Freq: Three times a day (TID) | ORAL | 0 refills | Status: DC | PRN

## 2017-10-22 MED ORDER — METFORMIN HCL 1000 MG PO TABS: 1000.0000 mg | ORAL_TABLET | Freq: Two times a day (BID) | ORAL | 1 refills | Status: DC

## 2017-10-22 MED ORDER — GLYBURIDE 5 MG PO TABS: 5.0000 mg | ORAL_TABLET | Freq: Two times a day (BID) | ORAL | 1 refills | Status: DC

## 2017-10-22 NOTE — Patient Instructions (Signed)
DASH Eating Plan DASH stands for "Dietary Approaches to Stop Hypertension." The DASH eating plan is a healthy eating plan that has been shown to reduce high blood pressure (hypertension). It may also reduce your risk for type 2 diabetes, heart disease, and stroke. The DASH eating plan may also help with weight loss. What are tips for following this plan? General guidelines  Avoid eating more than 2,300 mg (milligrams) of salt (sodium) a day. If you have hypertension, you may need to reduce your sodium intake to 1,500 mg a day.  Limit alcohol intake to no more than 1 drink a day for nonpregnant women and 2 drinks a day for men. One drink equals 12 oz of beer, 5 oz of wine, or 1 oz of hard liquor.  Work with your health care provider to maintain a healthy body weight or to lose weight. Ask what an ideal weight is for you.  Get at least 30 minutes of exercise that causes your heart to beat faster (aerobic exercise) most days of the week. Activities may include walking, swimming, or biking.  Work with your health care provider or diet and nutrition specialist (dietitian) to adjust your eating plan to your individual calorie needs. Reading food labels  Check food labels for the amount of sodium per serving. Choose foods with less than 5 percent of the Daily Value of sodium. Generally, foods with less than 300 mg of sodium per serving fit into this eating plan.  To find whole grains, look for the word "whole" as the first word in the ingredient list. Shopping  Buy products labeled as "low-sodium" or "no salt added."  Buy fresh foods. Avoid canned foods and premade or frozen meals. Cooking  Avoid adding salt when cooking. Use salt-free seasonings or herbs instead of table salt or sea salt. Check with your health care provider or pharmacist before using salt substitutes.  Do not fry foods. Cook foods using healthy methods such as baking, boiling, grilling, and broiling instead.  Cook with  heart-healthy oils, such as olive, canola, soybean, or sunflower oil. Meal planning   Eat a balanced diet that includes: ? 5 or more servings of fruits and vegetables each day. At each meal, try to fill half of your plate with fruits and vegetables. ? Up to 6-8 servings of whole grains each day. ? Less than 6 oz of lean meat, poultry, or fish each day. A 3-oz serving of meat is about the same size as a deck of cards. One egg equals 1 oz. ? 2 servings of low-fat dairy each day. ? A serving of nuts, seeds, or beans 5 times each week. ? Heart-healthy fats. Healthy fats called Omega-3 fatty acids are found in foods such as flaxseeds and coldwater fish, like sardines, salmon, and mackerel.  Limit how much you eat of the following: ? Canned or prepackaged foods. ? Food that is high in trans fat, such as fried foods. ? Food that is high in saturated fat, such as fatty meat. ? Sweets, desserts, sugary drinks, and other foods with added sugar. ? Full-fat dairy products.  Do not salt foods before eating.  Try to eat at least 2 vegetarian meals each week.  Eat more home-cooked food and less restaurant, buffet, and fast food.  When eating at a restaurant, ask that your food be prepared with less salt or no salt, if possible. What foods are recommended? The items listed may not be a complete list. Talk with your dietitian about what   dietary choices are best for you. Grains Whole-grain or whole-wheat bread. Whole-grain or whole-wheat pasta. Brown rice. Oatmeal. Quinoa. Bulgur. Whole-grain and low-sodium cereals. Pita bread. Low-fat, low-sodium crackers. Whole-wheat flour tortillas. Vegetables Fresh or frozen vegetables (raw, steamed, roasted, or grilled). Low-sodium or reduced-sodium tomato and vegetable juice. Low-sodium or reduced-sodium tomato sauce and tomato paste. Low-sodium or reduced-sodium canned vegetables. Fruits All fresh, dried, or frozen fruit. Canned fruit in natural juice (without  added sugar). Meat and other protein foods Skinless chicken or turkey. Ground chicken or turkey. Pork with fat trimmed off. Fish and seafood. Egg whites. Dried beans, peas, or lentils. Unsalted nuts, nut butters, and seeds. Unsalted canned beans. Lean cuts of beef with fat trimmed off. Low-sodium, lean deli meat. Dairy Low-fat (1%) or fat-free (skim) milk. Fat-free, low-fat, or reduced-fat cheeses. Nonfat, low-sodium ricotta or cottage cheese. Low-fat or nonfat yogurt. Low-fat, low-sodium cheese. Fats and oils Soft margarine without trans fats. Vegetable oil. Low-fat, reduced-fat, or light mayonnaise and salad dressings (reduced-sodium). Canola, safflower, olive, soybean, and sunflower oils. Avocado. Seasoning and other foods Herbs. Spices. Seasoning mixes without salt. Unsalted popcorn and pretzels. Fat-free sweets. What foods are not recommended? The items listed may not be a complete list. Talk with your dietitian about what dietary choices are best for you. Grains Baked goods made with fat, such as croissants, muffins, or some breads. Dry pasta or rice meal packs. Vegetables Creamed or fried vegetables. Vegetables in a cheese sauce. Regular canned vegetables (not low-sodium or reduced-sodium). Regular canned tomato sauce and paste (not low-sodium or reduced-sodium). Regular tomato and vegetable juice (not low-sodium or reduced-sodium). Pickles. Olives. Fruits Canned fruit in a light or heavy syrup. Fried fruit. Fruit in cream or butter sauce. Meat and other protein foods Fatty cuts of meat. Ribs. Fried meat. Bacon. Sausage. Bologna and other processed lunch meats. Salami. Fatback. Hotdogs. Bratwurst. Salted nuts and seeds. Canned beans with added salt. Canned or smoked fish. Whole eggs or egg yolks. Chicken or turkey with skin. Dairy Whole or 2% milk, cream, and half-and-half. Whole or full-fat cream cheese. Whole-fat or sweetened yogurt. Full-fat cheese. Nondairy creamers. Whipped toppings.  Processed cheese and cheese spreads. Fats and oils Butter. Stick margarine. Lard. Shortening. Ghee. Bacon fat. Tropical oils, such as coconut, palm kernel, or palm oil. Seasoning and other foods Salted popcorn and pretzels. Onion salt, garlic salt, seasoned salt, table salt, and sea salt. Worcestershire sauce. Tartar sauce. Barbecue sauce. Teriyaki sauce. Soy sauce, including reduced-sodium. Steak sauce. Canned and packaged gravies. Fish sauce. Oyster sauce. Cocktail sauce. Horseradish that you find on the shelf. Ketchup. Mustard. Meat flavorings and tenderizers. Bouillon cubes. Hot sauce and Tabasco sauce. Premade or packaged marinades. Premade or packaged taco seasonings. Relishes. Regular salad dressings. Where to find more information:  National Heart, Lung, and Blood Institute: www.nhlbi.nih.gov  American Heart Association: www.heart.org Summary  The DASH eating plan is a healthy eating plan that has been shown to reduce high blood pressure (hypertension). It may also reduce your risk for type 2 diabetes, heart disease, and stroke.  With the DASH eating plan, you should limit salt (sodium) intake to 2,300 mg a day. If you have hypertension, you may need to reduce your sodium intake to 1,500 mg a day.  When on the DASH eating plan, aim to eat more fresh fruits and vegetables, whole grains, lean proteins, low-fat dairy, and heart-healthy fats.  Work with your health care provider or diet and nutrition specialist (dietitian) to adjust your eating plan to your individual   calorie needs. This information is not intended to replace advice given to you by your health care provider. Make sure you discuss any questions you have with your health care provider. Document Released: 01/16/2011 Document Revised: 01/21/2016 Document Reviewed: 01/21/2016 Elsevier Interactive Patient Education  2018 [employer]  Diabetes Mellitus and Nutrition When you have diabetes (diabetes mellitus), it is very  important to have healthy eating habits because your blood sugar (glucose) levels are greatly affected by what you eat and drink. Eating healthy foods in the appropriate amounts, at about the same times every day, can help you:  Control your blood glucose.  Lower your risk of heart disease.  Improve your blood pressure.  Reach or maintain a healthy weight.  Every person with diabetes is different, and each person has different needs for a meal plan. Your health care provider may recommend that you work with a diet and nutrition specialist (dietitian) to make a meal plan that is best for you. Your meal plan may vary depending on factors such as:  The calories you need.  The medicines you take.  Your weight.  Your blood glucose, blood pressure, and cholesterol levels.  Your activity level.  Other health conditions you have, such as heart or kidney disease.  How do carbohydrates affect me? Carbohydrates affect your blood glucose level more than any other type of food. Eating carbohydrates naturally increases the amount of glucose in your blood. Carbohydrate counting is a method for keeping track of how many carbohydrates you eat. Counting carbohydrates is important to keep your blood glucose at a healthy level, especially if you use insulin or take certain oral diabetes medicines. It is important to know how many carbohydrates you can safely have in each meal. This is different for every person. Your dietitian can help you calculate how many carbohydrates you should have at each meal and for snack. Foods that contain carbohydrates include:  Bread, cereal, rice, pasta, and crackers.  Potatoes and corn.  Peas, beans, and lentils.  Milk and yogurt.  Fruit and juice.  Desserts, such as cakes, cookies, ice cream, and candy.  How does alcohol affect me? Alcohol can cause a sudden decrease in blood glucose (hypoglycemia), especially if you use insulin or take certain oral diabetes  medicines. Hypoglycemia can be a life-threatening condition. Symptoms of hypoglycemia (sleepiness, dizziness, and confusion) are similar to symptoms of having too much alcohol. If your health care provider says that alcohol is safe for you, follow these guidelines:  Limit alcohol intake to no more than 1 drink per day for nonpregnant women and 2 drinks per day for men. One drink equals 12 oz of beer, 5 oz of wine, or 1 oz of hard liquor.  Do not drink on an empty stomach.  Keep yourself hydrated with water, diet soda, or unsweetened iced tea.  Keep in mind that regular soda, juice, and other mixers may contain a lot of sugar and must be counted as carbohydrates.  What are tips for following this plan? Reading food labels  Start by checking the serving size on the label. The amount of calories, carbohydrates, fats, and other nutrients listed on the label are based on one serving of the food. Many foods contain more than one serving per package.  Check the total grams (g) of carbohydrates in one serving. You can calculate the number of servings of carbohydrates in one serving by dividing the total carbohydrates by 15. For example, if a food has 30 g of total   carbohydrates, it would be equal to 2 servings of carbohydrates.  Check the number of grams (g) of saturated and trans fats in one serving. Choose foods that have low or no amount of these fats.  Check the number of milligrams (mg) of sodium in one serving. Most people should limit total sodium intake to less than 2,300 mg per day.  Always check the nutrition information of foods labeled as "low-fat" or "nonfat". These foods may be higher in added sugar or refined carbohydrates and should be avoided.  Talk to your dietitian to identify your daily goals for nutrients listed on the label. Shopping  Avoid buying canned, premade, or processed foods. These foods tend to be high in fat, sodium, and added sugar.  Shop around the outside edge  of the grocery store. This includes fresh fruits and vegetables, bulk grains, fresh meats, and fresh dairy. Cooking  Use low-heat cooking methods, such as baking, instead of high-heat cooking methods like deep frying.  Cook using healthy oils, such as olive, canola, or sunflower oil.  Avoid cooking with butter, cream, or high-fat meats. Meal planning  Eat meals and snacks regularly, preferably at the same times every day. Avoid going long periods of time without eating.  Eat foods high in fiber, such as fresh fruits, vegetables, beans, and whole grains. Talk to your dietitian about how many servings of carbohydrates you can eat at each meal.  Eat 4-6 ounces of lean protein each day, such as lean meat, chicken, fish, eggs, or tofu. 1 ounce is equal to 1 ounce of meat, chicken, or fish, 1 egg, or 1/4 cup of tofu.  Eat some foods each day that contain healthy fats, such as avocado, nuts, seeds, and fish. Lifestyle   Check your blood glucose regularly.  Exercise at least 30 minutes 5 or more days each week, or as told by your health care provider.  Take medicines as told by your health care provider.  Do not use any products that contain nicotine or tobacco, such as cigarettes and e-cigarettes. If you need help quitting, ask your health care provider.  Work with a counselor or diabetes educator to identify strategies to manage stress and any emotional and social challenges. What are some questions to ask my health care provider?  Do I need to meet with a diabetes educator?  Do I need to meet with a dietitian?  What number can I call if I have questions?  When are the best times to check my blood glucose? Where to find more information:  American Diabetes Association: diabetes.org/food-and-fitness/food  Academy of Nutrition and Dietetics: www.eatright.org/resources/health/diseases-and-conditions/diabetes  National Institute of Diabetes and Digestive and Kidney Diseases (NIH):  www.niddk.nih.gov/health-information/diabetes/overview/diet-eating-physical-activity Summary  A healthy meal plan will help you control your blood glucose and maintain a healthy lifestyle.  Working with a diet and nutrition specialist (dietitian) can help you make a meal plan that is best for you.  Keep in mind that carbohydrates and alcohol have immediate effects on your blood glucose levels. It is important to count carbohydrates and to use alcohol carefully. This information is not intended to replace advice given to you by your health care provider. Make sure you discuss any questions you have with your health care provider. Document Released: 10/24/2004 Document Revised: 03/03/2016 Document Reviewed: 03/03/2016 [employer] Interactive Patient Education  2018 [employer]  

## 2017-10-22 NOTE — Progress Notes (Signed)
Name: Tracey Tucker   MRN: [id number]    DOB: 1955/07/05   Date:10/22/2017       Progress Note  Subjective  Chief Complaint  Chief Complaint  Patient presents with  . Hyperlipidemia    3 month recheck, medication refills  . Diabetes  . Hypertension    HPI  Diabetes mellitus type 2 Checking sugars?  no Does patient feel additional teaching/training would be helpful?  no  Have they attended Diabetes education classes? no  Trying to limit white bread, white rice, white potatoes, sweets?  yes Trying to limit sweetened drinks like iced tea, soft drinks, sports drinks, fruit juices?  yes Checking feet every day/night?  yes Last eye exam:  UTD - due in November Denies: Polyuria, polydipsia, polyphagia, vision changes, or neuropathy. Diet: Chicken, potatoes, string beans for dinner, skips breakfast, hamburger patty with salad; granola bar, does drink diet drinks only (no tea). Most recent A1C:  Lab Results  Component Value Date   HGBA1C 7.7 06/16/2017    We will recheck today. Last CMP Results : is due for repeat today Urine Micro UTD? Yes Current Medication Management:  Diabetic Medications: Taking Trulicity once weekly, actos, metformin BID, glyburide BID. Was on Ozempic but it was too expensive. ACEI/ARB: Yes Statin: Yes Aspirin therapy: Yes  HTN:  -does take medications as prescribed - current regimen includes Lotensin 52m daily, propanolol 140mBID .  - taking medications as instructed, no medication side effects noted, no TIAs, no chest pain on exertion, no dyspnea on exertion, no swelling of ankles, no orthopnea or paroxysmal nocturnal dyspnea, no palpitations - DASH diet discussed - pt does follow a low sodium diet; salt added to cooking and salt shaker not on table - The following  are not contributing factors: stress, sedentary lifestyle, corticosteroid use, saturated fats in diet, sweets including black licorice, smoking, sleep apnea, decongestant  use.  Hyperlipidemia: Current Medication Regimen: Last Lipids:  Lab Results  Component Value Date   CHOL 142 03/13/2017   HDL 63 03/13/2017   LDLCALC 65 03/13/2017   TRIG 61 03/13/2017   CHOLHDL 2.3 03/13/2017   - Current Diet: Balanced, discussed limited butter use - Denies: Chest pain, shortness of breath, myalgias. - Documented aortic atherosclerosis? No - Risk factors for atherosclerosis: diabetes mellitus, hypercholesterolemia and hypertension  Obesity: Body mass index is 36.5 kg/m. Weight Management History: Diet: Balanced Exercise: moderately active - 3 days a week walking 45 minutes. Co-Morbid Conditions: diabetes mellitus, dyslipidemias and hypertension; 2 or more of these conditions combined with BMI >30 is considered morbid obesity; is this diagnosis appropriate and/or added to patient's problem list? Yes  Smoking: She currently smokes 5 cigarettes per day.  She does use snuff maybe once a day while she's cooking Cessation Techniques Discussed: She declines cessation I advised patient to quit, and offered support. Discussed current use pattern.   Congested Cough - x3 days. Denies fevers or chills, chest pain, shortness of breath. Has history of bronchitis, is current smoker (see above).  She does not take any daily inhalers or singulair at this time.  Discussed how uncontrolled DM may contribute to development of bacterial infections like PNA; also discussed smoking cessation as above.  Patient Active Problem List   Diagnosis Date Noted  . Colon cancer screening 03/11/2016  . Acute non-recurrent maxillary sinusitis 12/19/2015  . Productive cough 12/19/2015  . Arthritis of left knee 09/18/2015  . Type 2 diabetes mellitus with hyperglycemia, without long-term current use of insulin (HCC)  08/22/2014  . Essential hypertension 08/22/2014  . Hyperlipidemia 08/22/2014  . Obesity (BMI 35.0-39.9 without comorbidity) 08/22/2014    Past Surgical History:  Procedure  Laterality Date  . CATARACT EXTRACTION Left 05/17/14   [facility] [place]  . CATARACT EXTRACTION W/PHACO Right 07/26/2014   Procedure: CATARACT EXTRACTION PHACO AND INTRAOCULAR LENS PLACEMENT (I[facility]);  Surgeon: Leandrew Koyanagi, MD;  Location: [facility];  Service: Ophthalmology;  Laterality: Right;  DIABETIC  . CESAREAN SECTION    . FOOT SURGERY  2014    Family History  Problem Relation Age of Onset  . Diabetes Father   . Heart disease Father   . Diabetes Sister   . Hyperlipidemia Sister   . Hypertension Sister   . Hypertension Brother   . Diabetes Brother   . Diabetes Sister   . Kidney disease Sister   . Heart attack Sister   . Diabetes Sister   . Diabetes Sister   . Diabetes Brother   . Diabetes Brother   . Diabetes Brother     Social History   Socioeconomic History  . Marital status: Single    Spouse name: Not on file  . Number of children: Not on file  . Years of education: Not on file  . Highest education level: Not on file  Occupational History  . Not on file  Social Needs  . Financial resource strain: Not on file  . Food insecurity:    Worry: Not on file    Inability: Not on file  . Transportation needs:    Medical: Not on file    Non-medical: Not on file  Tobacco Use  . Smoking status: Current Every Day Smoker    Packs/day: 0.25    Years: 20.00    Pack years: 5.00    Types: Cigarettes  . Smokeless tobacco: Never Used  Substance and Sexual Activity  . Alcohol use: No    Alcohol/week: 0.0 standard drinks  . Drug use: No  . Sexual activity: Not Currently  Lifestyle  . Physical activity:    Days per week: Not on file    Minutes per session: Not on file  . Stress: Not on file  Relationships  . Social connections:    Talks on phone: Not on file    Gets together: Not on file    Attends religious service: Not on file    Active member of club or organization: Not on file    Attends meetings of clubs or organizations: Not on file     Relationship status: Not on file  . Intimate partner violence:    Fear of current or ex partner: Not on file    Emotionally abused: Not on file    Physically abused: Not on file    Forced sexual activity: Not on file  Other Topics Concern  . Not on file  Social History Narrative  . Not on file    Current Outpatient Medications:  .  aspirin 81 MG tablet, Take 81 mg by mouth daily. PM, Disp: , Rfl:  .  benazepril (LOTENSIN) 40 MG tablet, TAKE 1 TABLET (40 MG TOTAL) BY MOUTH DAILY IN THE MORNING, Disp: 90 tablet, Rfl: 0 .  blood glucose meter kit and supplies KIT, Dispense based on patient and insurance preference. Check sugars once a day fasting. E11.69, Disp: 1 each, Rfl: 0 .  bumetanide (BUMEX) 0.5 MG tablet, Take 1 tablet (0.5 mg total) by mouth 2 (two) times daily., Disp: 180 tablet, Rfl: 0 .  Cholecalciferol (VITAMIN D) 2000 units CAPS, Take 1 capsule (2,000 Units total) by mouth daily., Disp: 90 capsule, Rfl: 1 .  Dulaglutide (TRULICITY) 1.5 PX/1.0GY SOPN, Inject 1.5 mg into the skin once a week., Disp: 2 mL, Rfl: 2 .  glyBURIDE (DIABETA) 5 MG tablet, Take 1 tablet (5 mg total) by mouth 2 (two) times daily with a meal., Disp: 180 tablet, Rfl: 0 .  metFORMIN (GLUCOPHAGE) 1000 MG tablet, Take 1 tablet (1,000 mg total) by mouth 2 (two) times daily., Disp: 180 tablet, Rfl: 0 .  pioglitazone (ACTOS) 30 MG tablet, Take 1 tablet (30 mg total) by mouth daily., Disp: 90 tablet, Rfl: 0 .  propranolol (INDERAL) 10 MG tablet, Take 1 tablet (10 mg total) by mouth 2 (two) times daily., Disp: 180 tablet, Rfl: 0 .  simvastatin (ZOCOR) 40 MG tablet, TAKE 1 TABLET BY MOUTH NIGHTLY AT BEDTIME, Disp: 90 tablet, Rfl: 1 .  Vitamin D, Ergocalciferol, (DRISDOL) 50000 units CAPS capsule, , Disp: , Rfl: 0  No Known Allergies  ROS Constitutional: Negative for fever or weight change.  Respiratory: Negative for cough and shortness of breath.   Cardiovascular: Negative for chest pain or palpitations.   Gastrointestinal: Negative for abdominal pain, no bowel changes.  Musculoskeletal: Negative for gait problem or joint swelling.  Skin: Negative for rash.  Neurological: Negative for dizziness or headache.  No other specific complaints in a complete review of systems (except as listed in HPI above).  Objective  Vitals:   10/22/17 0808  BP: 124/84  Pulse: 72  Resp: 16  Temp: 98.7 F (37.1 C)  TempSrc: Oral  SpO2: 98%  Weight: 180 lb 11.2 oz (82 kg)  Height: _0  (1.499 m)   Body mass index is 36.5 kg/m.  Physical Exam Constitutional: Patient appears well-developed and well-nourished. No distress.  HENT: Head: Normocephalic and atraumatic. Ears: B TMs ok, no erythema or effusion; Nose: Nose normal. Mouth/Throat: Oropharynx is clear and moist. No oropharyngeal exudate.  Eyes: Conjunctivae and EOM are normal. Pupils are equal, round, and reactive to light. No scleral icterus.  Neck: Normal range of motion. Neck supple without lymphadenopathy. No JVD present. No thyromegaly present.  Cardiovascular: Normal rate, regular rhythm and normal heart sounds.  No murmur heard. No BLE edema. Pulmonary/Chest: Effort normal and breath sounds with expiratory wheezes throughout; congested cough on examination with hoarse voice. No respiratory distress. Abdominal: Soft. Bowel sounds are normal, no distension. There is no tenderness. no masses Musculoskeletal: Normal range of motion, no joint effusions. No gross deformities Neurological: he is alert and oriented to person, place, and time. No cranial nerve deficit. Coordination, balance, strength, speech and gait are normal.  Skin: Skin is warm and dry. No rash noted. No erythema.  Psychiatric: Patient has a normal mood and affect. behavior is normal. Judgment and thought content normal.  No results found for this or any previous visit (from the past 72 hour(s)).   PHQ2/9: Depression screen [facility] 2/9 10/22/2017 03/13/2017 12/12/2016 09/11/2016  06/11/2016  Decreased Interest 0 0 0 0 0  Down, Depressed, Hopeless 0 0 0 0 0  PHQ - 2 Score 0 0 0 0 0  Altered sleeping 0 - - - -  Tired, decreased energy 0 - - - -  Change in appetite 0 - - - -  Feeling bad or failure about yourself  0 - - - -  Trouble concentrating 0 - - - -  Moving slowly or fidgety/restless 0 - - - -  Suicidal thoughts 0 - - - -  PHQ-9 Score 0 - - - -  Difficult doing work/chores Not difficult at all - - - -   Fall Risk: Fall Risk  10/22/2017 03/13/2017 12/12/2016 09/11/2016 06/11/2016  Falls in the past year? No No No No Yes  Number falls in past yr: - - - - 1  Injury with Fall? - - - - No  Follow up - - - - Falls evaluation completed   Assessment & Plan  1. Type 2 diabetes mellitus with hyperglycemia, without long-term current use of insulin (HCC) - COMPLETE METABOLIC PANEL WITH GFR - Hemoglobin A1c - metFORMIN (GLUCOPHAGE) 1000 MG tablet; Take 1 tablet (1,000 mg total) by mouth 2 (two) times daily.  Dispense: 180 tablet; Refill: 1 - Dulaglutide (TRULICITY) 1.5 HI/1.5KS SOPN; Inject 1.5 mg into the skin once a week.  Dispense: 2 mL; Refill: 2 - pioglitazone (ACTOS) 30 MG tablet; Take 1 tablet (30 mg total) by mouth daily.  Dispense: 90 tablet; Refill: 1 - glyBURIDE (DIABETA) 5 MG tablet; Take 1 tablet (5 mg total) by mouth 2 (two) times daily with a meal.  Dispense: 180 tablet; Refill: 1  2. Essential hypertension - COMPLETE METABOLIC PANEL WITH GFR - propranolol (INDERAL) 10 MG tablet; Take 1 tablet (10 mg total) by mouth 2 (two) times daily.  Dispense: 180 tablet; Refill: 1 - benazepril (LOTENSIN) 40 MG tablet; TAKE 1 TABLET (40 MG TOTAL) BY MOUTH DAILY IN THE MORNING  Dispense: 90 tablet; Refill: 1  3. Pure hypercholesterolemia - Continue Simvastatin  4. Obesity (BMI 35.0-39.9 without comorbidity) - COMPLETE METABOLIC PANEL WITH GFR - Metformin, Trulicity per orders  5. Morbid obesity ([place]) - Metformin, Trulicity per orders - Discussed increased risk of  Mi/Stroke, and other poor healthy outcomes related to morbid obesity and co-morbidities. - Discussed importance of 150 minutes of physical activity weekly, eat two servings of fish weekly, eat one serving of tree nuts ( cashews, pistachios, pecans, almonds.Marland Kitchen) every other day, eat 6 servings of fruit/vegetables daily and drink plenty of water and avoid sweet beverages.   6. Tobacco abuse counseling - Smoking cessation instruction/counseling given:  counseled patient on the dangers of tobacco use, advised patient to stop smoking, and reviewed strategies to maximize success; she is not ready to quit at this time.  7. Acute bronchitis, unspecified organism - budesonide-formoterol (SYMBICORT) 80-4.5 MCG/ACT inhaler; Inhale 2 puffs into the lungs 2 (two) times daily.  Dispense: 1 Inhaler; Refill: 1 - benzonatate (TESSALON) 100 MG capsule; Take 1 capsule (100 mg total) by mouth 3 (three) times daily as needed for cough.  Dispense: 30 capsule; Refill: 0 - Albuterol Sulfate (PROAIR RESPICLICK) 845 (90 Base) MCG/ACT AEPB; Inhale 2 puffs into the lungs every 6 (six) hours as needed (For cough or wheezing).  Dispense: 1 each; Refill: 1  -Reviewed Health Maintenance: Had flu shot yesterday with [facility] Employee clinic

## 2017-10-23 LAB — COMPLETE METABOLIC PANEL WITH GFR
AG Ratio: 1.8 (calc) (ref 1.0–2.5)
ALBUMIN MSPROF: 4.1 g/dL (ref 3.6–5.1)
ALT: 9 U/L (ref 6–29)
AST: 11 U/L (ref 10–35)
Alkaline phosphatase (APISO): 54 U/L (ref 33–130)
BUN: 13 mg/dL (ref 7–25)
CO2: 28 mmol/L (ref 20–32)
CREATININE: 0.82 mg/dL (ref 0.50–0.99)
Calcium: 9.6 mg/dL (ref 8.6–10.4)
Chloride: 103 mmol/L (ref 98–110)
GFR, Est African American: 88 mL/min/{1.73_m2} (ref >=60)
GFR, Est Non African American: 76 mL/min/{1.73_m2} (ref >=60)
GLUCOSE: 95 mg/dL (ref 65–139)
Globulin: 2.3 g/dL (calc) (ref 1.9–3.7)
Potassium: 3.8 mmol/L (ref 3.5–5.3)
Sodium: 141 mmol/L (ref 135–146)
Total Bilirubin: 0.3 mg/dL (ref 0.2–1.2)
Total Protein: 6.4 g/dL (ref 6.1–8.1)

## 2017-10-23 LAB — HEMOGLOBIN A1C
EAG (MMOL/L): 7.7 (calc)
HEMOGLOBIN A1C: 6.5 %{Hb} — AB (ref <5.7)
MEAN PLASMA GLUCOSE: 140 (calc)

## 2017-12-17 ENCOUNTER — Other Ambulatory Visit: Payer: Self-pay | Admitting: Nurse Practitioner

## 2017-12-17 DIAGNOSIS — I10 Essential (primary) hypertension: Secondary | ICD-10-CM

## 2018-01-21 ENCOUNTER — Ambulatory Visit: Payer: 59 | Admitting: Family Medicine

## 2018-01-21 ENCOUNTER — Encounter: Payer: Self-pay | Admitting: Family Medicine

## 2018-01-21 ENCOUNTER — Other Ambulatory Visit: Payer: Self-pay | Admitting: Family Medicine

## 2018-01-21 VITALS — BP 112/74 | HR 90 | Temp 98.0°F | Resp 16 | Ht 59.0 in | Wt 180.3 lb

## 2018-01-21 DIAGNOSIS — Z1239 Encounter for other screening for malignant neoplasm of breast: Secondary | ICD-10-CM

## 2018-01-21 DIAGNOSIS — Z716 Tobacco abuse counseling: Secondary | ICD-10-CM

## 2018-01-21 DIAGNOSIS — E1165 Type 2 diabetes mellitus with hyperglycemia: Secondary | ICD-10-CM | POA: Diagnosis not present

## 2018-01-21 DIAGNOSIS — E785 Hyperlipidemia, unspecified: Secondary | ICD-10-CM

## 2018-01-21 DIAGNOSIS — E7800 Pure hypercholesterolemia, unspecified: Secondary | ICD-10-CM | POA: Diagnosis not present

## 2018-01-21 DIAGNOSIS — E669 Obesity, unspecified: Secondary | ICD-10-CM

## 2018-01-21 DIAGNOSIS — M1712 Unilateral primary osteoarthritis, left knee: Secondary | ICD-10-CM | POA: Diagnosis not present

## 2018-01-21 DIAGNOSIS — I10 Essential (primary) hypertension: Secondary | ICD-10-CM | POA: Diagnosis not present

## 2018-01-21 MED ORDER — METFORMIN HCL 1000 MG PO TABS: 1000.0000 mg | ORAL_TABLET | Freq: Two times a day (BID) | ORAL | 1 refills | Status: DC

## 2018-01-21 MED ORDER — SIMVASTATIN 40 MG PO TABS: ORAL_TABLET | ORAL | 1 refills | Status: DC

## 2018-01-21 MED ORDER — DULAGLUTIDE 1.5 MG/0.5ML ~~LOC~~ SOAJ: 1.5000 mg | SUBCUTANEOUS | 2 refills | Status: DC

## 2018-01-21 NOTE — Progress Notes (Signed)
Name: Tracey Tucker   MRN: [id number]    DOB: May 28, 1955   Date:01/21/2018       Progress Note  Subjective  Chief Complaint  Chief Complaint  Patient presents with  . Diabetes    3 month recheck, medication refill  . Hyperlipidemia  . Hypertension    HPI  Diabetes mellitus type 2 Checking sugars?  no Does patient feel additional teaching/training would be helpful?  no  Have they attended Diabetes education classes? no  Trying to limit white bread, white rice, white potatoes, sweets?  Avoids bread and rice much, does eat potatoes sometimes Trying to limit sweetened drinks like iced tea, soft drinks, sports drinks, fruit juices?  Drinks diet only Checking feet every day/night?  yes - doing well.  Last eye exam: 01/06/2018 - she will call to schedule her exam.  Denies: Polyuria, polydipsia, polyphagia, vision changes, or neuropathy.  Most recent A1C:  Lab Results  Component Value Date   HGBA1C 6.5 (H) 10/22/2017    We will recheck today. Last CMP Results : is due for repeat today    Component Value Date/Time   NA 141 10/22/2017 0848   NA 139 10/22/2012 0732   K 3.8 10/22/2017 0848   K 3.9 10/22/2012 0732   CL 103 10/22/2017 0848   CL 107 10/22/2012 0732   CO2 28 10/22/2017 0848   CO2 28 10/22/2012 0732   GLUCOSE 95 10/22/2017 0848   GLUCOSE 111 (H) 10/22/2012 0732   BUN 13 10/22/2017 0848   BUN 15 10/22/2012 0732   CREATININE 0.82 10/22/2017 0848   CALCIUM 9.6 10/22/2017 0848   CALCIUM 9.7 10/22/2012 0732   PROT 6.4 10/22/2017 0848   PROT 7.2 10/22/2012 0732   ALBUMIN 3.9 04/07/2016 0837   ALBUMIN 3.7 10/22/2012 0732   AST 11 10/22/2017 0848   AST 14 (L) 10/22/2012 0732   ALT 9 10/22/2017 0848   ALT 18 10/22/2012 0732   ALKPHOS 55 04/07/2016 0837   ALKPHOS 63 10/22/2012 0732   BILITOT 0.3 10/22/2017 0848   BILITOT 0.2 10/22/2012 0732   GFRNONAA 76 10/22/2017 0848   GFRAA 88 10/22/2017 0848   Urine Micro UTD? No Current Medication Management: Diabetic  Medications: Metformin, Trulicity, glyburide, and actos ACEI/ARB: Yes Statin: Yes Aspirin therapy: No  HTN:  -does take medications as prescribed - current regimen includes Lotensin '40mg'$  daily, propanolol '10mg'$  BID .  - taking medications as instructed, no medication side effects noted, no TIAs, no chest pain on exertion, no dyspnea on exertion, no swelling of ankles, no orthopnea or paroxysmal nocturnal dyspnea, no palpitations - DASH diet discussed - pt does follow a low sodium diet; salt not added to cooking and salt shaker not on table. - Unchanged and stable since last visit  Hyperlipidemia: Current Medication Regimen:simvastatin - Current Diet: Balanced, has stopped cooking with butter - Denies: Chest pain, shortness of breath, myalgias. - Documented aortic atherosclerosis? No - Risk factors for atherosclerosis: diabetes mellitus, hypercholesterolemia and hypertension  Obesity: Body mass index is 36.42 kg/m. Weight Management History: Diet: Balanced Exercise: moderately active - 3 days a week walking 2 miles Co-Morbid Conditions: diabetes mellitus, dyslipidemias and hypertension; 2 or more of these conditions combined with BMI >30 is considered morbid obesity; is this diagnosis appropriate and/or added to patient's problem list? Yes  Smoking: She currently smokes 5 cigarettes per day.  She does use snuff maybe once a day while she's cooking. Cessation Techniques Discussed - She declines cessation. I advised patient  to quit, and offered support. Discussed current use pattern.   LEFT Knee Arthritis: Only hurts when her when it's cold out - does not take any medication for this.  Works in supply chain at [employer]  Patient Active Problem List   Diagnosis Date Noted  . Tobacco abuse counseling 10/22/2017  . Morbid obesity ([place]) 10/22/2017  . Colon cancer screening 03/11/2016  . Acute non-recurrent maxillary sinusitis 12/19/2015  . Productive cough 12/19/2015  . Arthritis of left  knee 09/18/2015  . Type 2 diabetes mellitus with hyperglycemia, without long-term current use of insulin ([place]) 08/22/2014  . Essential hypertension 08/22/2014  . Hyperlipidemia 08/22/2014  . Obesity (BMI 35.0-39.9 without comorbidity) 08/22/2014    Past Surgical History:  Procedure Laterality Date  . CATARACT EXTRACTION Left 05/17/14   [facility] [place]  . CATARACT EXTRACTION W/PHACO Right 07/26/2014   Procedure: CATARACT EXTRACTION PHACO AND INTRAOCULAR LENS PLACEMENT ([facility]);  Surgeon: Leandrew Koyanagi, MD;  Location: [facility];  Service: Ophthalmology;  Laterality: Right;  DIABETIC  . CESAREAN SECTION    . FOOT SURGERY  2014    Family History  Problem Relation Age of Onset  . Diabetes Father   . Heart disease Father   . Diabetes Sister   . Hyperlipidemia Sister   . Hypertension Sister   . Hypertension Brother   . Diabetes Brother   . Diabetes Sister   . Kidney disease Sister   . Heart attack Sister   . Diabetes Sister   . Diabetes Sister   . Diabetes Brother   . Diabetes Brother   . Diabetes Brother     Social History   Socioeconomic History  . Marital status: Single    Spouse name: Not on file  . Number of children: Not on file  . Years of education: Not on file  . Highest education level: Not on file  Occupational History  . Not on file  Social Needs  . Financial resource strain: Not on file  . Food insecurity:    Worry: Not on file    Inability: Not on file  . Transportation needs:    Medical: Not on file    Non-medical: Not on file  Tobacco Use  . Smoking status: Current Every Day Smoker    Packs/day: 0.25    Years: 20.00    Pack years: 5.00    Types: Cigarettes  . Smokeless tobacco: Never Used  Substance and Sexual Activity  . Alcohol use: No    Alcohol/week: 0.0 standard drinks  . Drug use: No  . Sexual activity: Not Currently  Lifestyle  . Physical activity:    Days per week: Not on file    Minutes per session: Not on file  .  Stress: Not on file  Relationships  . Social connections:    Talks on phone: Not on file    Gets together: Not on file    Attends religious service: Not on file    Active member of club or organization: Not on file    Attends meetings of clubs or organizations: Not on file    Relationship status: Not on file  . Intimate partner violence:    Fear of current or ex partner: Not on file    Emotionally abused: Not on file    Physically abused: Not on file    Forced sexual activity: Not on file  Other Topics Concern  . Not on file  Social History Narrative  . Not on file  Current Outpatient Medications:  .  Albuterol Sulfate (PROAIR RESPICLICK) 045 (90 Base) MCG/ACT AEPB, Inhale 2 puffs into the lungs every 6 (six) hours as needed (For cough or wheezing)., Disp: 1 each, Rfl: 1 .  aspirin 81 MG tablet, Take 81 mg by mouth daily. PM, Disp: , Rfl:  .  benazepril (LOTENSIN) 40 MG tablet, TAKE 1 TABLET (40 MG TOTAL) BY MOUTH DAILY IN THE MORNING, Disp: 90 tablet, Rfl: 1 .  blood glucose meter kit and supplies KIT, Dispense based on patient and insurance preference. Check sugars once a day fasting. E11.69, Disp: 1 each, Rfl: 0 .  budesonide-formoterol (SYMBICORT) 80-4.5 MCG/ACT inhaler, Inhale 2 puffs into the lungs 2 (two) times daily., Disp: 1 Inhaler, Rfl: 1 .  bumetanide (BUMEX) 0.5 MG tablet, TAKE 1 TABLET BY MOUTH TWICE DAILY, Disp: 180 tablet, Rfl: 0 .  Cholecalciferol (VITAMIN D) 2000 units CAPS, Take 1 capsule (2,000 Units total) by mouth daily., Disp: 90 capsule, Rfl: 1 .  Dulaglutide (TRULICITY) 1.5 WU/9.8JX SOPN, Inject 1.5 mg into the skin once a week., Disp: 2 mL, Rfl: 2 .  glyBURIDE (DIABETA) 5 MG tablet, Take 1 tablet (5 mg total) by mouth 2 (two) times daily with a meal., Disp: 180 tablet, Rfl: 1 .  metFORMIN (GLUCOPHAGE) 1000 MG tablet, Take 1 tablet (1,000 mg total) by mouth 2 (two) times daily., Disp: 180 tablet, Rfl: 1 .  pioglitazone (ACTOS) 30 MG tablet, Take 1 tablet  (30 mg total) by mouth daily., Disp: 90 tablet, Rfl: 1 .  propranolol (INDERAL) 10 MG tablet, Take 1 tablet (10 mg total) by mouth 2 (two) times daily., Disp: 180 tablet, Rfl: 1 .  simvastatin (ZOCOR) 40 MG tablet, TAKE 1 TABLET BY MOUTH NIGHTLY AT BEDTIME, Disp: 90 tablet, Rfl: 1 .  Vitamin D, Ergocalciferol, (DRISDOL) 50000 units CAPS capsule, , Disp: , Rfl: 0 .  benzonatate (TESSALON) 100 MG capsule, Take 1 capsule (100 mg total) by mouth 3 (three) times daily as needed for cough. (Patient not taking: Reported on 01/21/2018), Disp: 30 capsule, Rfl: 0  No Known Allergies  I personally reviewed active problem list, medication list, allergies, health maintenance, notes from last encounter, lab results with the patient/caregiver today.   ROS Constitutional: Negative for fever or weight change.  Respiratory: Negative for cough and shortness of breath.   Cardiovascular: Negative for chest pain or palpitations.  Gastrointestinal: Negative for abdominal pain, no bowel changes.  Musculoskeletal: Negative for gait problem or joint swelling.  Skin: Negative for rash.  Neurological: Negative for dizziness or headache.  No other specific complaints in a complete review of systems (except as listed in HPI above).  Objective  Vitals:   01/21/18 0809  BP: 112/74  Pulse: 90  Resp: 16  Temp: 98 F (36.7 C)  TempSrc: Oral  SpO2: 94%  Weight: 180 lb 4.8 oz (81.8 kg)  Height: '4\' 11"'$  (1.499 m)   Body mass index is 36.42 kg/m.  Physical Exam  Constitutional: Patient appears well-developed and well-nourished. No distress.  HENT: Head: Normocephalic and atraumatic. Ears: bilateral TMs with no erythema or effusion; Nose: Nose normal. Mouth/Throat: Oropharynx is clear and moist. No oropharyngeal exudate or tonsillar swelling.  Eyes: Conjunctivae and EOM are normal. No scleral icterus.  Pupils are equal, round, and reactive to light.  Neck: Normal range of motion. Neck supple. No JVD present. No  thyromegaly present.  Cardiovascular: Normal rate, regular rhythm and normal heart sounds.  No murmur heard. No BLE edema. Pulmonary/Chest:  Effort normal and breath sounds normal. No respiratory distress. Musculoskeletal: Normal range of motion, no joint effusions. No gross deformities Neurological: Pt is alert and oriented to person, place, and time. No cranial nerve deficit. Coordination, balance, strength, speech and gait are normal.  Skin: Skin is warm and dry. No rash noted. No erythema.  Psychiatric: Patient has a normal mood and affect. behavior is normal. Judgment and thought content normal.  No results found for this or any previous visit (from the past 72 hour(s)).  PHQ2/9: Depression screen [facility] 2/9 01/21/2018 10/22/2017 03/13/2017 12/12/2016 09/11/2016  Decreased Interest 0 0 0 0 0  Down, Depressed, Hopeless 0 0 0 0 0  PHQ - 2 Score 0 0 0 0 0  Altered sleeping 0 0 - - -  Tired, decreased energy 0 0 - - -  Change in appetite 0 0 - - -  Feeling bad or failure about yourself  0 0 - - -  Trouble concentrating 0 0 - - -  Moving slowly or fidgety/restless 0 0 - - -  Suicidal thoughts 0 0 - - -  PHQ-9 Score 0 0 - - -  Difficult doing work/chores Not difficult at all Not difficult at all - - -   Fall Risk: Fall Risk  01/21/2018 10/22/2017 03/13/2017 12/12/2016 09/11/2016  Falls in the past year? 0 No No No No  Number falls in past yr: 0 - - - -  Injury with Fall? 0 - - - -  Follow up - - - - -    Assessment & Plan  1. Morbid obesity ([place]) - Discussed importance of 150 minutes of physical activity weekly, eat two servings of fish weekly, eat one serving of tree nuts ( cashews, pistachios, pecans, almonds.Marland Kitchen) every other day, eat 6 servings of fruit/vegetables daily and drink plenty of water and avoid sweet beverages.  - Hemoglobin A1c - COMPLETE METABOLIC PANEL WITH GFR - Dulaglutide (TRULICITY) 1.5 GG/8.3MO SOPN; Inject 1.5 mg into the skin once a week.  Dispense: 2 mL; Refill: 2 -  metFORMIN (GLUCOPHAGE) 1000 MG tablet; Take 1 tablet (1,000 mg total) by mouth 2 (two) times daily.  Dispense: 180 tablet; Refill: 1  2. Type 2 diabetes mellitus with hyperglycemia, without long-term current use of insulin ([facility]) - Hemoglobin A1c - COMPLETE METABOLIC PANEL WITH GFR - Urine Microalbumin w/creat. ratio - Dulaglutide (TRULICITY) 1.5 QH/4.7ML SOPN; Inject 1.5 mg into the skin once a week.  Dispense: 2 mL; Refill: 2 - metFORMIN (GLUCOPHAGE) 1000 MG tablet; Take 1 tablet (1,000 mg total) by mouth 2 (two) times daily.  Dispense: 180 tablet; Refill: 1  3. Essential hypertension - COMPLETE METABOLIC PANEL WITH GFR  4. Pure hypercholesterolemia - Lipid panel  5. Tobacco abuse counseling - Cessation discussed, not ready to quit  6. Obesity (BMI 35.0-39.9 without comorbidity) - Dulaglutide (TRULICITY) 1.5 YY/5.0PT SOPN; Inject 1.5 mg into the skin once a week.  Dispense: 2 mL; Refill: 2 - metFORMIN (GLUCOPHAGE) 1000 MG tablet; Take 1 tablet (1,000 mg total) by mouth 2 (two) times daily.  Dispense: 180 tablet; Refill: 1  7. Arthritis of left knee - May take Tylenol PRN  8. Morbid obesity ([facility]) - Hemoglobin A1c - COMPLETE METABOLIC PANEL WITH GFR - Dulaglutide (TRULICITY) 1.5 WS/5.6CL SOPN; Inject 1.5 mg into the skin once a week.  Dispense: 2 mL; Refill: 2 - metFORMIN (GLUCOPHAGE) 1000 MG tablet; Take 1 tablet (1,000 mg total) by mouth 2 (two) times daily.  Dispense: 180 tablet; Refill:  1  9. Hyperlipidemia, unspecified hyperlipidemia type - simvastatin (ZOCOR) 40 MG tablet; TAKE 1 TABLET BY MOUTH NIGHTLY AT BEDTIME  Dispense: 90 tablet; Refill: 1 - Diet Discussed

## 2018-01-21 NOTE — Patient Instructions (Signed)
Diabetes Mellitus and Nutrition When you have diabetes (diabetes mellitus), it is very important to have healthy eating habits because your blood sugar (glucose) levels are greatly affected by what you eat and drink. Eating healthy foods in the appropriate amounts, at about the same times every day, can help you:  Control your blood glucose.  Lower your risk of heart disease.  Improve your blood pressure.  Reach or maintain a healthy weight.  Every person with diabetes is different, and each person has different needs for a meal plan. Your health care provider may recommend that you work with a diet and nutrition specialist (dietitian) to make a meal plan that is best for you. Your meal plan may vary depending on factors such as:  The calories you need.  The medicines you take.  Your weight.  Your blood glucose, blood pressure, and cholesterol levels.  Your activity level.  Other health conditions you have, such as heart or kidney disease.  How do carbohydrates affect me? Carbohydrates affect your blood glucose level more than any other type of food. Eating carbohydrates naturally increases the amount of glucose in your blood. Carbohydrate counting is a method for keeping track of how many carbohydrates you eat. Counting carbohydrates is important to keep your blood glucose at a healthy level, especially if you use insulin or take certain oral diabetes medicines. It is important to know how many carbohydrates you can safely have in each meal. This is different for every person. Your dietitian can help you calculate how many carbohydrates you should have at each meal and for snack. Foods that contain carbohydrates include:  Bread, cereal, rice, pasta, and crackers.  Potatoes and corn.  Peas, beans, and lentils.  Milk and yogurt.  Fruit and juice.  Desserts, such as cakes, cookies, ice cream, and candy.  How does alcohol affect me? Alcohol can cause a sudden decrease in blood  glucose (hypoglycemia), especially if you use insulin or take certain oral diabetes medicines. Hypoglycemia can be a life-threatening condition. Symptoms of hypoglycemia (sleepiness, dizziness, and confusion) are similar to symptoms of having too much alcohol. If your health care provider says that alcohol is safe for you, follow these guidelines:  Limit alcohol intake to no more than 1 drink per day for nonpregnant women and 2 drinks per day for men. One drink equals 12 oz of beer, 5 oz of wine, or 1 oz of hard liquor.  Do not drink on an empty stomach.  Keep yourself hydrated with water, diet soda, or unsweetened iced tea.  Keep in mind that regular soda, juice, and other mixers may contain a lot of sugar and must be counted as carbohydrates.  What are tips for following this plan? Reading food labels  Start by checking the serving size on the label. The amount of calories, carbohydrates, fats, and other nutrients listed on the label are based on one serving of the food. Many foods contain more than one serving per package.  Check the total grams (g) of carbohydrates in one serving. You can calculate the number of servings of carbohydrates in one serving by dividing the total carbohydrates by 15. For example, if a food has 30 g of total carbohydrates, it would be equal to 2 servings of carbohydrates.  Check the number of grams (g) of saturated and trans fats in one serving. Choose foods that have low or no amount of these fats.  Check the number of milligrams (mg) of sodium in one serving. Most people   should limit total sodium intake to less than 2,300 mg per day.  Always check the nutrition information of foods labeled as "low-fat" or "nonfat". These foods may be higher in added sugar or refined carbohydrates and should be avoided.  Talk to your dietitian to identify your daily goals for nutrients listed on the label. Shopping  Avoid buying canned, premade, or processed foods. These  foods tend to be high in fat, sodium, and added sugar.  Shop around the outside edge of the grocery store. This includes fresh fruits and vegetables, bulk grains, fresh meats, and fresh dairy. Cooking  Use low-heat cooking methods, such as baking, instead of high-heat cooking methods like deep frying.  Cook using healthy oils, such as olive, canola, or sunflower oil.  Avoid cooking with butter, cream, or high-fat meats. Meal planning  Eat meals and snacks regularly, preferably at the same times every day. Avoid going long periods of time without eating.  Eat foods high in fiber, such as fresh fruits, vegetables, beans, and whole grains. Talk to your dietitian about how many servings of carbohydrates you can eat at each meal.  Eat 4-6 ounces of lean protein each day, such as lean meat, chicken, fish, eggs, or tofu. 1 ounce is equal to 1 ounce of meat, chicken, or fish, 1 egg, or 1/4 cup of tofu.  Eat some foods each day that contain healthy fats, such as avocado, nuts, seeds, and fish. Lifestyle   Check your blood glucose regularly.  Exercise at least 30 minutes 5 or more days each week, or as told by your health care provider.  Take medicines as told by your health care provider.  Do not use any products that contain nicotine or tobacco, such as cigarettes and e-cigarettes. If you need help quitting, ask your health care provider.  Work with a counselor or diabetes educator to identify strategies to manage stress and any emotional and social challenges. What are some questions to ask my health care provider?  Do I need to meet with a diabetes educator?  Do I need to meet with a dietitian?  What number can I call if I have questions?  When are the best times to check my blood glucose? Where to find more information:  American Diabetes Association: diabetes.org/food-and-fitness/food  Academy of Nutrition and Dietetics:  www.eatright.org/resources/health/diseases-and-conditions/diabetes  National Institute of Diabetes and Digestive and Kidney Diseases (NIH): www.niddk.nih.gov/health-information/diabetes/overview/diet-eating-physical-activity Summary  A healthy meal plan will help you control your blood glucose and maintain a healthy lifestyle.  Working with a diet and nutrition specialist (dietitian) can help you make a meal plan that is best for you.  Keep in mind that carbohydrates and alcohol have immediate effects on your blood glucose levels. It is important to count carbohydrates and to use alcohol carefully. This information is not intended to replace advice given to you by your health care provider. Make sure you discuss any questions you have with your health care provider. Document Released: 10/24/2004 Document Revised: 03/03/2016 Document Reviewed: 03/03/2016 Elsevier Interactive Patient Education  2018 [employer]  

## 2018-01-22 LAB — COMPLETE METABOLIC PANEL WITH GFR
AG Ratio: 1.7 (calc) (ref 1.0–2.5)
ALKALINE PHOSPHATASE (APISO): 57 U/L (ref 33–130)
ALT: 10 U/L (ref 6–29)
AST: 13 U/L (ref 10–35)
Albumin: 4.2 g/dL (ref 3.6–5.1)
BUN: 13 mg/dL (ref 7–25)
CHLORIDE: 102 mmol/L (ref 98–110)
CO2: 30 mmol/L (ref 20–32)
Calcium: 9.6 mg/dL (ref 8.6–10.4)
Creat: 0.81 mg/dL (ref 0.50–0.99)
GFR, Est African American: 90 mL/min/{1.73_m2} (ref >=60)
GFR, Est Non African American: 77 mL/min/{1.73_m2} (ref >=60)
Globulin: 2.5 g/dL (calc) (ref 1.9–3.7)
Glucose, Bld: 132 mg/dL — ABNORMAL HIGH (ref 65–99)
Potassium: 4 mmol/L (ref 3.5–5.3)
Sodium: 140 mmol/L (ref 135–146)
Total Bilirubin: 0.3 mg/dL (ref 0.2–1.2)
Total Protein: 6.7 g/dL (ref 6.1–8.1)

## 2018-01-22 LAB — LIPID PANEL
Cholesterol: 152 mg/dL (ref <200)
HDL: 59 mg/dL (ref >50)
LDL CHOLESTEROL (CALC): 79 mg/dL
Non-HDL Cholesterol (Calc): 93 mg/dL (calc) (ref <130)
Total CHOL/HDL Ratio: 2.6 (calc) (ref <5.0)
Triglycerides: 59 mg/dL (ref <150)

## 2018-01-22 LAB — MICROALBUMIN / CREATININE URINE RATIO
Creatinine, Urine: 57 mg/dL (ref 20–275)
Microalb Creat Ratio: 4 mcg/mg creat (ref <30)
Microalb, Ur: 0.2 mg/dL

## 2018-01-22 LAB — HEMOGLOBIN A1C
Hgb A1c MFr Bld: 6.4 % of total Hgb — ABNORMAL HIGH (ref <5.7)
Mean Plasma Glucose: 137 (calc)
eAG (mmol/L): 7.6 (calc)

## 2018-02-08 DIAGNOSIS — H02051 Trichiasis without entropian right upper eyelid: Secondary | ICD-10-CM | POA: Diagnosis not present

## 2018-02-23 ENCOUNTER — Other Ambulatory Visit: Payer: Self-pay | Admitting: Family Medicine

## 2018-02-23 DIAGNOSIS — E1165 Type 2 diabetes mellitus with hyperglycemia: Secondary | ICD-10-CM

## 2018-02-23 DIAGNOSIS — E669 Obesity, unspecified: Secondary | ICD-10-CM

## 2018-02-24 ENCOUNTER — Ambulatory Visit: Payer: 59 | Admitting: Family Medicine

## 2018-02-24 ENCOUNTER — Encounter: Payer: Self-pay | Admitting: Emergency Medicine

## 2018-02-24 ENCOUNTER — Encounter: Payer: Self-pay | Admitting: Family Medicine

## 2018-02-24 VITALS — BP 122/80 | HR 119 | Temp 98.6°F | Resp 16 | Ht 59.0 in | Wt 174.5 lb

## 2018-02-24 DIAGNOSIS — R1010 Upper abdominal pain, unspecified: Secondary | ICD-10-CM

## 2018-02-24 DIAGNOSIS — R55 Syncope and collapse: Secondary | ICD-10-CM | POA: Diagnosis not present

## 2018-02-24 DIAGNOSIS — E162 Hypoglycemia, unspecified: Secondary | ICD-10-CM

## 2018-02-24 DIAGNOSIS — R110 Nausea: Secondary | ICD-10-CM | POA: Diagnosis not present

## 2018-02-24 LAB — COMPLETE METABOLIC PANEL WITH GFR
AG Ratio: 1.6 (calc) (ref 1.0–2.5)
ALKALINE PHOSPHATASE (APISO): 62 U/L (ref 33–130)
ALT: 44 U/L — ABNORMAL HIGH (ref 6–29)
AST: 17 U/L (ref 10–35)
Albumin: 4.3 g/dL (ref 3.6–5.1)
BUN: 9 mg/dL (ref 7–25)
CHLORIDE: 101 mmol/L (ref 98–110)
CO2: 27 mmol/L (ref 20–32)
Calcium: 9.8 mg/dL (ref 8.6–10.4)
Creat: 0.87 mg/dL (ref 0.50–0.99)
GFR, Est African American: 82 mL/min/{1.73_m2} (ref >=60)
GFR, Est Non African American: 71 mL/min/{1.73_m2} (ref >=60)
Globulin: 2.7 g/dL (calc) (ref 1.9–3.7)
Glucose, Bld: 282 mg/dL — ABNORMAL HIGH (ref 65–99)
Potassium: 3.8 mmol/L (ref 3.5–5.3)
Sodium: 136 mmol/L (ref 135–146)
Total Bilirubin: 0.3 mg/dL (ref 0.2–1.2)
Total Protein: 7 g/dL (ref 6.1–8.1)

## 2018-02-24 LAB — CBC WITH DIFFERENTIAL/PLATELET
Absolute Monocytes: 990 cells/uL — ABNORMAL HIGH (ref 200–950)
Basophils Absolute: 40 cells/uL (ref 0–200)
Basophils Relative: 0.4 %
Eosinophils Absolute: 20 cells/uL (ref 15–500)
Eosinophils Relative: 0.2 %
HCT: 40.6 % (ref 35.0–45.0)
Hemoglobin: 13.2 g/dL (ref 11.7–15.5)
Lymphs Abs: 1939 cells/uL (ref 850–3900)
MCH: 28.4 pg (ref 27.0–33.0)
MCHC: 32.5 g/dL (ref 32.0–36.0)
MCV: 87.5 fL (ref 80.0–100.0)
MPV: 11.3 fL (ref 7.5–12.5)
Monocytes Relative: 9.8 %
Neutro Abs: 7110 cells/uL (ref 1500–7800)
Neutrophils Relative %: 70.4 %
Platelets: 246 10*3/uL (ref 140–400)
RBC: 4.64 10*6/uL (ref 3.80–5.10)
RDW: 13.7 % (ref 11.0–15.0)
Total Lymphocyte: 19.2 %
WBC: 10.1 10*3/uL (ref 3.8–10.8)

## 2018-02-24 LAB — LIPASE: Lipase: 34 U/L (ref 7–60)

## 2018-02-24 LAB — GLUCOSE, POCT (MANUAL RESULT ENTRY): POC Glucose: 277 mg/dl — AB (ref 70–99)

## 2018-02-24 MED ORDER — ONDANSETRON HCL 4 MG PO TABS: 4.0000 mg | ORAL_TABLET | Freq: Three times a day (TID) | ORAL | 0 refills | Status: DC | PRN

## 2018-02-24 NOTE — Patient Instructions (Addendum)
If blood sugar is below 70, call our office for instructions.  Diabetes Mellitus and Sick Day Management Blood sugar (glucose) can be difficult to control when you are sick. Common illnesses that can cause problems for people with diabetes (diabetes mellitus) include colds, fever, flu (influenza), nausea, vomiting, and diarrhea. These illnesses can cause stress and loss of body fluids (dehydration), and those issues can cause blood glucose levels to increase. Because of this, it is very important to take your insulin and diabetes medicines and eat some form of carbohydrate when you are sick. You should make a plan for days when you are sick (sick day plan) as part of your diabetes management plan. You and your health care provider should make this plan in advance. The following guidelines are intended to help you manage an illness that lasts for about 24 hours or less. Your health care provider may also give you more specific instructions. What do I need to do to manage my blood glucose?   Check your blood glucose every 2-4 hours, or as often as told by your health care provider.  Know your sick day treatment goals. Your target blood glucose levels may be different when you are sick.  If you use insulin, take your usual dose. ? If your blood glucose continues to be too high, you may need to take an additional insulin dose as told by your health care provider.  If you use oral diabetes medicine, you may need to stop taking it if you are not able to eat or drink normally. Ask your health care provider about whether you need to stop taking these medicines while you are sick.  If you use injectable hormone medicines other than insulin to control your diabetes, ask your health care provider about whether you need to stop taking these medicines while you are sick. What else can I do to manage my diabetes when I am sick? Check your ketones  If you have type 1 diabetes, check your urine ketones every 4  hours.  If you have type 2 diabetes, check your urine ketones as often as told by your health care provider. Drink fluids  Drink enough fluid to keep your urine clear or pale yellow. This is especially important if you have a fever, vomiting, or diarrhea. Those symptoms can lead to dehydration.  Follow any instructions from your health care provider about beverages to avoid. ? Do not drink alcohol, caffeine, or drinks that contain a lot of sugar. Take medicines as directed  Take-over-the-counter and prescription medicines only as told by your health care provider.  Check medicine labels for added sugars. Some medicines may contain sugar or types of sugars that can raise your blood glucose level. What foods can I eat when I am sick?  You need to eat some form of carbohydrates when you are sick. You should eat 45-50 grams (45-50 g) of carbohydrates every 3-4 hours until you feel better. All of the food choices below contain about 15 g of carbohydrates. Plan ahead and keep some of these foods around so you have them if you get sick.  4-6 oz (120-177 mL) carbonated beverage that contains sugar, such as regular (not diet) soda. You may be able to drink carbonated beverages more easily if you open the beverage and let it sit at room temperature for a few minutes before drinking.   of a twin frozen ice pop.  4 oz (120 g) regular gelatin.  4 oz (120 mL) fruit juice.  4 oz (120 g) ice cream or frozen yogurt.  2 oz (60 g) sherbet.  8 oz (240 mL) clear broth or soup.  4 oz (120 g) regular custard.  4 oz (120 g) regular pudding.  8 oz (240 g) plain yogurt.  1 slice bread or toast.  6 saltine crackers.  5 vanilla wafers. Questions to ask your health care provider Consider asking the following questions so you know what to do on days when you are sick:  Should I adjust my diabetes medicines?  How often do I need to check my blood glucose?  What supplies do I need to manage my  diabetes at home when I am sick?  What number can I call if I have questions?  What foods and drinks should I avoid? Contact a health care provider if:  You develop symptoms of diabetic ketoacidosis, such as: ? Fatigue. ? Weight loss. ? Excessive thirst. ? Light-headedness. ? Fruity or sweet-smelling breath. ? Excessive urination. ? Vision changes. ? Confusion or irritability. ? Nausea. ? Vomiting. ? Rapid breathing. ? Pain in the abdomen. ? Feeling flushed.  You are unable to drink fluids without vomiting.  You have any of the following for more than 6 hours: ? Nausea. ? Vomiting. ? Diarrhea.  Your blood glucose is at or above 240 mg/dL (40.913.3 mmol/L), even after you take an additional insulin dose.  You have a change in how you think, feel, or act (mental status).  You develop another serious illness.  You have been sick or have had a fever for 2 days or longer and you are not getting better. Get help right away if:  Your blood glucose is lower than 54 mg/dL (3.0 mmol/L).  You have difficulty breathing.  You have moderate or high ketone levels in your urine.  You used emergency glucagon to treat low blood glucose. Summary  Blood sugar (glucose) can be difficult to control when you are sick. Common illnesses that can cause problems for people with diabetes (diabetes mellitus) include colds, fever, flu (influenza), nausea, vomiting, and diarrhea.  Illnesses can cause stress and loss of body fluids (dehydration), and those issues can cause blood glucose levels to increase.  Make a plan for days when you are sick (sick day plan) as part of your diabetes management plan. You and your health care provider should make this plan in advance.  It is very important to take your insulin and diabetes medicines and to eat some form of carbohydrate when you are sick.  Contact your health care provider if have problems managing your blood glucose levels when you are sick, or  if you have been sick or had a fever for 2 days or longer and are not getting better. This information is not intended to replace advice given to you by your health care provider. Make sure you discuss any questions you have with your health care provider. Document Released: 01/30/2003 Document Revised: 10/26/2015 Document Reviewed: 10/26/2015 Elsevier Interactive Patient Education  [employer]2019 [employer] Inc[employer]

## 2018-02-24 NOTE — Progress Notes (Signed)
Name: Tracey Tucker   MRN: [id number]    DOB: 1955-04-14   Date:02/24/2018       Progress Note  Subjective  Chief Complaint  Chief Complaint  Patient presents with  . Generalized Body Aches  . Abdominal Pain    vomiting, nausea, sweating, Glucose dropped to 23 had to call paramedics    HPI  Pt presents with concern for body aches, vomiting, and hypoglycemia.  She started feeling 3 days ago with body aches and upper abdominal pain.  She then developed nausea and vomiting 2 days ago.  Yesterday she didn't eat anything, so she did not vomit.  However, yesterday, she had hypoglycemic episode with syncope around 4:15pm. Her daughter called 911, and paramedics came - BG was 23, she was given glucagon and sugar/orange juice, etc and raised her BG.  She hasn't been checking BG's at home prior to this episode, last night after this episode she was at 373, 363, then early this morning is was 263. She has not eaten anything yet today - did well with a cup of coffee this mornings.  Patient Active Problem List   Diagnosis Date Noted  . Tobacco abuse counseling 10/22/2017  . Morbid obesity ([place]) 10/22/2017  . Colon cancer screening 03/11/2016  . Productive cough 12/19/2015  . Arthritis of left knee 09/18/2015  . Type 2 diabetes mellitus with hyperglycemia, without long-term current use of insulin ([place]) 08/22/2014  . Essential hypertension 08/22/2014  . Hyperlipidemia 08/22/2014  . Obesity (BMI 35.0-39.9 without comorbidity) 08/22/2014    Social History   Tobacco Use  . Smoking status: Current Every Day Smoker    Packs/day: 0.25    Years: 20.00    Pack years: 5.00    Types: Cigarettes  . Smokeless tobacco: Never Used  Substance Use Topics  . Alcohol use: No    Alcohol/week: 0.0 standard drinks     Current Outpatient Medications:  .  Albuterol Sulfate (PROAIR RESPICLICK) 242 (90 Base) MCG/ACT AEPB, Inhale 2 puffs into the lungs every 6 (six) hours as needed (For cough or wheezing).,  Disp: 1 each, Rfl: 1 .  aspirin 81 MG tablet, Take 81 mg by mouth daily. PM, Disp: , Rfl:  .  benazepril (LOTENSIN) 40 MG tablet, TAKE 1 TABLET (40 MG TOTAL) BY MOUTH DAILY IN THE MORNING, Disp: 90 tablet, Rfl: 1 .  blood glucose meter kit and supplies KIT, Dispense based on patient and insurance preference. Check sugars once a day fasting. E11.69, Disp: 1 each, Rfl: 0 .  budesonide-formoterol (SYMBICORT) 80-4.5 MCG/ACT inhaler, Inhale 2 puffs into the lungs 2 (two) times daily., Disp: 1 Inhaler, Rfl: 1 .  bumetanide (BUMEX) 0.5 MG tablet, TAKE 1 TABLET BY MOUTH TWICE DAILY, Disp: 180 tablet, Rfl: 0 .  Cholecalciferol (VITAMIN D) 2000 units CAPS, Take 1 capsule (2,000 Units total) by mouth daily., Disp: 90 capsule, Rfl: 1 .  glyBURIDE (DIABETA) 5 MG tablet, Take 1 tablet (5 mg total) by mouth 2 (two) times daily with a meal., Disp: 180 tablet, Rfl: 1 .  metFORMIN (GLUCOPHAGE) 1000 MG tablet, Take 1 tablet (1,000 mg total) by mouth 2 (two) times daily., Disp: 180 tablet, Rfl: 1 .  pioglitazone (ACTOS) 30 MG tablet, Take 1 tablet (30 mg total) by mouth daily., Disp: 90 tablet, Rfl: 1 .  propranolol (INDERAL) 10 MG tablet, Take 1 tablet (10 mg total) by mouth 2 (two) times daily., Disp: 180 tablet, Rfl: 1 .  simvastatin (ZOCOR) 40 MG tablet, TAKE 1  TABLET BY MOUTH NIGHTLY AT BEDTIME, Disp: 90 tablet, Rfl: 1 .  TRULICITY 1.5 XB/1.4NW SOPN, INJECT 1.5 MG INTO THE SKIN ONCE A WEEK., Disp: 2 mL, Rfl: 3 .  Vitamin D, Ergocalciferol, (DRISDOL) 50000 units CAPS capsule, , Disp: , Rfl: 0  No Known Allergies  I personally reviewed active problem list, medication list, allergies, notes from last encounter, lab results with the patient/caregiver today.  ROS  Ten systems reviewed and is negative except as mentioned in HPI.  Objective  Vitals:   02/24/18 0908  BP: 122/80  Pulse: (!) 119  Resp: 16  Temp: 98.6 F (37 C)  TempSrc: Oral  SpO2: 93%  Weight: 174 lb 8 oz (79.2 kg)  Height: '4\' 11"'$  (1.499  m)   Body mass index is 35.24 kg/m. Tachycardia likely secondary to probably dehydration.  Nursing Note and Vital Signs reviewed.  Physical Exam  Constitutional: Patient appears well-developed and well-nourished. No distress.  HENT: Head: Normocephalic and atraumatic. Ears: bilateral TMs with no erythema or effusion; Nose: Nose normal. Mouth/Throat: Oropharynx is clear and moist. No oropharyngeal exudate or tonsillar swelling.  Eyes: Conjunctivae and EOM are normal. No scleral icterus.  Pupils are equal, round, and reactive to light.  Neck: Normal range of motion. Neck supple. No JVD present. No thyromegaly present.  Cardiovascular: Normal rate, regular rhythm and normal heart sounds.  No murmur heard. No BLE edema. Pulmonary/Chest: Effort normal and breath sounds normal. No respiratory distress. Abdominal: Soft. Bowel sounds are normal, no distension. There is tenderness to BUQ, negative murphy's. No masses. Musculoskeletal: Normal range of motion, no joint effusions. No gross deformities.  There is tenderness along trapezius muscles bilaterally, chest wall is tender as well. Neurological: Pt is alert and oriented to person, place, and time. No cranial nerve deficit. Coordination, balance, strength, speech and gait are normal.  Skin: Skin is warm and dry. No rash noted. No erythema.  Psychiatric: Patient has a normal mood and affect. behavior is normal. Judgment and thought content normal.  No results found for this or any previous visit (from the past 72 hour(s)).  Assessment & Plan  1. Hypoglycemia - COMPLETE METABOLIC PANEL WITH GFR - Advised to HOLD Metformin unless she is able to eat a full meal, hold Actos for today. Monitor BG's QID for the next 2 days, follow up in 2 days.  If BG <  2. Syncope, unspecified syncope type - COMPLETE METABOLIC PANEL WITH GFR - POCT glucose (manual entry) - CBC w/Diff/Platelet  3. Pain of upper abdomen - COMPLETE METABOLIC PANEL WITH GFR - CBC  w/Diff/Platelet - Lipase  4. Nausea - ondansetron (ZOFRAN) 4 MG tablet; Take 1 tablet (4 mg total) by mouth every 8 (eight) hours as needed for nausea or vomiting.  Dispense: 15 tablet; Refill: 0  -Red flags and when to present for emergency care or RTC including fever >101.69F, chest pain, shortness of breath, new/worsening/un-resolving symptoms, syncope, reviewed with patient at time of visit. Follow up and care instructions discussed and provided in AVS.

## 2018-02-26 ENCOUNTER — Ambulatory Visit: Payer: 59 | Admitting: Family Medicine

## 2018-02-26 ENCOUNTER — Encounter: Payer: Self-pay | Admitting: Family Medicine

## 2018-02-26 VITALS — BP 114/68 | HR 73 | Temp 98.0°F | Resp 16 | Ht 59.0 in | Wt 178.0 lb

## 2018-02-26 DIAGNOSIS — E1165 Type 2 diabetes mellitus with hyperglycemia: Secondary | ICD-10-CM

## 2018-02-26 DIAGNOSIS — R55 Syncope and collapse: Secondary | ICD-10-CM | POA: Diagnosis not present

## 2018-02-26 DIAGNOSIS — E162 Hypoglycemia, unspecified: Secondary | ICD-10-CM | POA: Diagnosis not present

## 2018-02-26 DIAGNOSIS — R110 Nausea: Secondary | ICD-10-CM

## 2018-02-26 LAB — GLUCOSE, POCT (MANUAL RESULT ENTRY): POC Glucose: 103 mg/dl — AB (ref 70–99)

## 2018-02-26 NOTE — Patient Instructions (Signed)
Diabetes Mellitus and Sick Day Management Blood sugar (glucose) can be difficult to control when you are sick. Common illnesses that can cause problems for people with diabetes (diabetes mellitus) include colds, fever, flu (influenza), nausea, vomiting, and diarrhea. These illnesses can cause stress and loss of body fluids (dehydration), and those issues can cause blood glucose levels to increase. Because of this, it is very important to take your insulin and diabetes medicines and eat some form of carbohydrate when you are sick. You should make a plan for days when you are sick (sick day plan) as part of your diabetes management plan. You and your health care provider should make this plan in advance. The following guidelines are intended to help you manage an illness that lasts for about 24 hours or less. Your health care provider may also give you more specific instructions. What do I need to do to manage my blood glucose?   Check your blood glucose every 2-4 hours, or as often as told by your health care provider.  Know your sick day treatment goals. Your target blood glucose levels may be different when you are sick.  If you use insulin, take your usual dose. ? If your blood glucose continues to be too high, you may need to take an additional insulin dose as told by your health care provider.  If you use oral diabetes medicine, you may need to stop taking it if you are not able to eat or drink normally. Ask your health care provider about whether you need to stop taking these medicines while you are sick.  If you use injectable hormone medicines other than insulin to control your diabetes, ask your health care provider about whether you need to stop taking these medicines while you are sick. What else can I do to manage my diabetes when I am sick? Check your ketones  If you have type 1 diabetes, check your urine ketones every 4 hours.  If you have type 2 diabetes, check your urine ketones  as often as told by your health care provider. Drink fluids  Drink enough fluid to keep your urine clear or pale yellow. This is especially important if you have a fever, vomiting, or diarrhea. Those symptoms can lead to dehydration.  Follow any instructions from your health care provider about beverages to avoid. ? Do not drink alcohol, caffeine, or drinks that contain a lot of sugar. Take medicines as directed  Take-over-the-counter and prescription medicines only as told by your health care provider.  Check medicine labels for added sugars. Some medicines may contain sugar or types of sugars that can raise your blood glucose level. What foods can I eat when I am sick?  You need to eat some form of carbohydrates when you are sick. You should eat 45-50 grams (45-50 g) of carbohydrates every 3-4 hours until you feel better. All of the food choices below contain about 15 g of carbohydrates. Plan ahead and keep some of these foods around so you have them if you get sick.  4-6 oz (120-177 mL) carbonated beverage that contains sugar, such as regular (not diet) soda. You may be able to drink carbonated beverages more easily if you open the beverage and let it sit at room temperature for a few minutes before drinking.   of a twin frozen ice pop.  4 oz (120 g) regular gelatin.  4 oz (120 mL) fruit juice.  4 oz (120 g) ice cream or frozen yogurt.  2   oz (60 g) sherbet.  8 oz (240 mL) clear broth or soup.  4 oz (120 g) regular custard.  4 oz (120 g) regular pudding.  8 oz (240 g) plain yogurt.  1 slice bread or toast.  6 saltine crackers.  5 vanilla wafers. Questions to ask your health care provider Consider asking the following questions so you know what to do on days when you are sick:  Should I adjust my diabetes medicines?  How often do I need to check my blood glucose?  What supplies do I need to manage my diabetes at home when I am sick?  What number can I call if I  have questions?  What foods and drinks should I avoid? Contact a health care provider if:  You develop symptoms of diabetic ketoacidosis, such as: ? Fatigue. ? Weight loss. ? Excessive thirst. ? Light-headedness. ? Fruity or sweet-smelling breath. ? Excessive urination. ? Vision changes. ? Confusion or irritability. ? Nausea. ? Vomiting. ? Rapid breathing. ? Pain in the abdomen. ? Feeling flushed.  You are unable to drink fluids without vomiting.  You have any of the following for more than 6 hours: ? Nausea. ? Vomiting. ? Diarrhea.  Your blood glucose is at or above 240 mg/dL (13.3 mmol/L), even after you take an additional insulin dose.  You have a change in how you think, feel, or act (mental status).  You develop another serious illness.  You have been sick or have had a fever for 2 days or longer and you are not getting better. Get help right away if:  Your blood glucose is lower than 54 mg/dL (3.0 mmol/L).  You have difficulty breathing.  You have moderate or high ketone levels in your urine.  You used emergency glucagon to treat low blood glucose. Summary  Blood sugar (glucose) can be difficult to control when you are sick. Common illnesses that can cause problems for people with diabetes (diabetes mellitus) include colds, fever, flu (influenza), nausea, vomiting, and diarrhea.  Illnesses can cause stress and loss of body fluids (dehydration), and those issues can cause blood glucose levels to increase.  Make a plan for days when you are sick (sick day plan) as part of your diabetes management plan. You and your health care provider should make this plan in advance.  It is very important to take your insulin and diabetes medicines and to eat some form of carbohydrate when you are sick.  Contact your health care provider if have problems managing your blood glucose levels when you are sick, or if you have been sick or had a fever for 2 days or longer and are  not getting better. This information is not intended to replace advice given to you by your health care provider. Make sure you discuss any questions you have with your health care provider. Document Released: 01/30/2003 Document Revised: 10/26/2015 Document Reviewed: 10/26/2015 Elsevier Interactive Patient Education  2019 [employer]  

## 2018-02-26 NOTE — Progress Notes (Signed)
Name: Tracey Tucker   MRN: [id number]    DOB: 08-26-1955   Date:02/26/2018       Progress Note  Subjective  Chief Complaint  Chief Complaint  Patient presents with  . Follow-up    2 day F/U  . Abdominal Pain    States her pain has resolved with nausea medication, denies any pain.     HPI  Pt presents to follow up on hypoglycemia and N/V w/ abdominal pain for which she was seen 02/24/2018.  She restarted her Metformin and Actos this morning, doing well with BG of 103 in office today.  She is feeling well, no complaints, no NVD, abdominal pain, or fatigue.  She is in good spirits today.  Patient Active Problem List   Diagnosis Date Noted  . Tobacco abuse counseling 10/22/2017  . Morbid obesity ([place]) 10/22/2017  . Colon cancer screening 03/11/2016  . Productive cough 12/19/2015  . Arthritis of left knee 09/18/2015  . Type 2 diabetes mellitus with hyperglycemia, without long-term current use of insulin ([place]) 08/22/2014  . Essential hypertension 08/22/2014  . Hyperlipidemia 08/22/2014  . Obesity (BMI 35.0-39.9 without comorbidity) 08/22/2014    Social History   Tobacco Use  . Smoking status: Current Every Day Smoker    Packs/day: 0.25    Years: 20.00    Pack years: 5.00    Types: Cigarettes  . Smokeless tobacco: Never Used  Substance Use Topics  . Alcohol use: No    Alcohol/week: 0.0 standard drinks     Current Outpatient Medications:  .  Albuterol Sulfate (PROAIR RESPICLICK) 709 (90 Base) MCG/ACT AEPB, Inhale 2 puffs into the lungs every 6 (six) hours as needed (For cough or wheezing)., Disp: 1 each, Rfl: 1 .  aspirin 81 MG tablet, Take 81 mg by mouth daily. PM, Disp: , Rfl:  .  benazepril (LOTENSIN) 40 MG tablet, TAKE 1 TABLET (40 MG TOTAL) BY MOUTH DAILY IN THE MORNING, Disp: 90 tablet, Rfl: 1 .  blood glucose meter kit and supplies KIT, Dispense based on patient and insurance preference. Check sugars once a day fasting. E11.69, Disp: 1 each, Rfl: 0 .   budesonide-formoterol (SYMBICORT) 80-4.5 MCG/ACT inhaler, Inhale 2 puffs into the lungs 2 (two) times daily., Disp: 1 Inhaler, Rfl: 1 .  bumetanide (BUMEX) 0.5 MG tablet, TAKE 1 TABLET BY MOUTH TWICE DAILY, Disp: 180 tablet, Rfl: 0 .  Cholecalciferol (VITAMIN D) 2000 units CAPS, Take 1 capsule (2,000 Units total) by mouth daily., Disp: 90 capsule, Rfl: 1 .  glyBURIDE (DIABETA) 5 MG tablet, Take 1 tablet (5 mg total) by mouth 2 (two) times daily with a meal., Disp: 180 tablet, Rfl: 1 .  metFORMIN (GLUCOPHAGE) 1000 MG tablet, Take 1 tablet (1,000 mg total) by mouth 2 (two) times daily., Disp: 180 tablet, Rfl: 1 .  ondansetron (ZOFRAN) 4 MG tablet, Take 1 tablet (4 mg total) by mouth every 8 (eight) hours as needed for nausea or vomiting., Disp: 15 tablet, Rfl: 0 .  pioglitazone (ACTOS) 30 MG tablet, Take 1 tablet (30 mg total) by mouth daily., Disp: 90 tablet, Rfl: 1 .  propranolol (INDERAL) 10 MG tablet, Take 1 tablet (10 mg total) by mouth 2 (two) times daily., Disp: 180 tablet, Rfl: 1 .  simvastatin (ZOCOR) 40 MG tablet, TAKE 1 TABLET BY MOUTH NIGHTLY AT BEDTIME, Disp: 90 tablet, Rfl: 1 .  TRULICITY 1.5 GG/8.3MO SOPN, INJECT 1.5 MG INTO THE SKIN ONCE A WEEK., Disp: 2 mL, Rfl: 3 .  Vitamin  D, Ergocalciferol, (DRISDOL) 50000 units CAPS capsule, , Disp: , Rfl: 0  No Known Allergies  I personally reviewed active problem list, medication list, allergies, notes from last encounter, lab results with the patient/caregiver today.  ROS  Ten systems reviewed and is negative except as mentioned in HPI.  Objective  Vitals:   02/26/18 1251  BP: 114/68  Pulse: 73  Resp: 16  Temp: 98 F (36.7 C)  TempSrc: Oral  SpO2: 96%  Weight: 178 lb (80.7 kg)  Height: '4\' 11"'$  (1.499 m)   Body mass index is 35.95 kg/m.  Nursing Note and Vital Signs reviewed.  Physical Exam  Constitutional: Patient appears well-developed and well-nourished. No distress.  HENT: Head: Normocephalic and atraumatic. Ears:  bilateral TMs with no erythema or effusion; Nose: Nose normal.   Eyes: Conjunctivae and EOM are normal. No scleral icterus.  Neck: Normal range of motion. Neck supple. No JVD present. No thyromegaly present.  Cardiovascular: Normal rate, regular rhythm and normal heart sounds.  No murmur heard. No BLE edema. Pulmonary/Chest: Effort normal and breath sounds normal. No respiratory distress. Abdominal: Soft. Bowel sounds are normal, no distension. There is no tenderness. No masses. Musculoskeletal: Normal range of motion, no joint effusions. No gross deformities Neurological: Pt is alert and oriented to person, place, and time. No cranial nerve deficit. Coordination, balance, strength, speech and gait are normal.  Skin: Skin is warm and dry. No rash noted. No erythema.  Psychiatric: Patient has a normal mood and affect. behavior is normal. Judgment and thought content normal.   Results for orders placed or performed in visit on 02/26/18 (from the past 72 hour(s))  POCT Glucose (CBG)     Status: Abnormal   Collection Time: 02/26/18 12:58 PM  Result Value Ref Range   POC Glucose 103 (A) 70 - 99 mg/dl    Assessment & Plan  1. Syncope, unspecified syncope type - Secondary to hypoglycemia. No further episodes.  2. Hypoglycemia - Stable BG's at home for 3 days, stable BG today in office with restart of medications this morning - POCT Glucose (CBG)  3. Type 2 diabetes mellitus with hyperglycemia, without long-term current use of insulin (HCC) - Restarted her Metformin and Actos.  4. Nausea - Resolved; abdominal pain and vomiting have resolved.  -Red flags and when to present for emergency care or RTC including fever >101.52F, chest pain, shortness of breath, new/worsening/un-resolving symptoms, recurrence of symptoms reviewed with patient at time of visit. Follow up and care instructions discussed and provided in AVS.

## 2018-03-29 ENCOUNTER — Other Ambulatory Visit: Payer: Self-pay | Admitting: Family Medicine

## 2018-03-29 DIAGNOSIS — I10 Essential (primary) hypertension: Secondary | ICD-10-CM

## 2018-04-07 ENCOUNTER — Encounter: Payer: Self-pay | Admitting: Family Medicine

## 2018-04-27 ENCOUNTER — Other Ambulatory Visit: Payer: Self-pay | Admitting: Family Medicine

## 2018-04-27 DIAGNOSIS — I10 Essential (primary) hypertension: Secondary | ICD-10-CM

## 2018-04-27 DIAGNOSIS — E1165 Type 2 diabetes mellitus with hyperglycemia: Secondary | ICD-10-CM

## 2018-05-10 MED FILL — PROPRANOLOL 10 MG TABLET: 10 | 90 days supply | Qty: 180 | Fill #0

## 2018-05-13 MED FILL — TRULICITY 1.5 MG/0.5 ML PEN: 1.5 | 28 days supply | Qty: 2 | Fill #0

## 2018-05-24 ENCOUNTER — Encounter: Payer: Self-pay | Admitting: Family Medicine

## 2018-05-24 ENCOUNTER — Other Ambulatory Visit: Payer: Self-pay

## 2018-05-24 ENCOUNTER — Ambulatory Visit (INDEPENDENT_AMBULATORY_CARE_PROVIDER_SITE_OTHER): Payer: 59 | Admitting: Family Medicine

## 2018-05-24 DIAGNOSIS — E7800 Pure hypercholesterolemia, unspecified: Secondary | ICD-10-CM | POA: Diagnosis not present

## 2018-05-24 DIAGNOSIS — I10 Essential (primary) hypertension: Secondary | ICD-10-CM

## 2018-05-24 DIAGNOSIS — Z716 Tobacco abuse counseling: Secondary | ICD-10-CM

## 2018-05-24 DIAGNOSIS — E1165 Type 2 diabetes mellitus with hyperglycemia: Secondary | ICD-10-CM | POA: Diagnosis not present

## 2018-05-24 DIAGNOSIS — M1712 Unilateral primary osteoarthritis, left knee: Secondary | ICD-10-CM

## 2018-05-24 MED ORDER — BUMETANIDE 0.5 MG PO TABS: 0.5000 mg | ORAL_TABLET | Freq: Two times a day (BID) | ORAL | 1 refills | Status: DC

## 2018-05-24 MED ORDER — PIOGLITAZONE HCL 30 MG PO TABS: 30.0000 mg | ORAL_TABLET | Freq: Every day | ORAL | 1 refills | Status: DC

## 2018-05-24 MED ORDER — GLYBURIDE 5 MG PO TABS: 5.0000 mg | ORAL_TABLET | Freq: Two times a day (BID) | ORAL | 1 refills | Status: DC

## 2018-05-24 MED FILL — BUMETANIDE 0.5 MG TABS: 0.5 | 90 days supply | Qty: 180 | Fill #0

## 2018-05-24 MED FILL — glyBURIDE 5 MG TABS: 5 | 90 days supply | Qty: 180 | Fill #0

## 2018-05-24 NOTE — Progress Notes (Signed)
Name: Tracey Tucker   MRN: [id number]    DOB: Dec 17, 1955   Date:05/24/2018       Progress Note  Subjective  Chief Complaint  Chief Complaint  Patient presents with  . Diabetes  . Hypertension  . Medication Refill    I connected with  Corinna Capra  on 05/24/18 at  9:00 AM EDT by a video enabled telemedicine application and verified that I am speaking with the correct person using two identifiers.  I discussed the limitations of evaluation and management by telemedicine and the availability of in person appointments. The patient expressed understanding and agreed to proceed. Staff also discussed with the patient that there may be a patient responsible charge related to this service. Patient Location: home Provider Location: Home Additional Individuals present: None  HPI  Diabetes mellitus type 2 Checking sugars?  yes How often? A few times a week. Range (low to high) over last two weeks:  86-160 Does patient feel additional teaching/training would be helpful?  no  Have they attended Diabetes education classes? no  Trying to limit white bread, white rice, white potatoes, sweets?  yes Trying to limit sweetened drinks like iced tea, soft drinks, sports drinks, fruit juices?  yes Checking feet every day/night?  yes Last eye exam:  04/06/2018 Denies: Polyuria, polydipsia, polyphagia, vision changes, or neuropathy.  Most recent A1C: We will wait until next visit for repeat A1C due to COVID-19 Pandemic as her A1C has been well controlled for over a year now. Lab Results  Component Value Date   HGBA1C 6.4 (H) 01/21/2018    We will recheck today. Last CMP Results : is due for repeat today Urine Micro UTD? Yes Current Medication Management: Diabetic Medications: Metformin and Trulicity, glyburide, and actos ACEI/ARB: Yes Statin: Yes Aspirin therapy: Yes  HTN: 137/78 on Saturday 05/22/2018 at home -does take medications as prescribed - current regimen includes benazepril '40mg'$ ,  propanolol BID, and bumex 0.'5mg'$    - taking medications as instructed, no medication side effects noted, no TIAs, no chest pain on exertion, no dyspnea on exertion, no swelling of ankles - DASH diet discussed - pt does follow a low sodium diet; salt not added to cooking and salt shaker on table  Hyperlipidemia: Current Medication Regimen: simvastatin  Last Lipids: Lab Results  Component Value Date   CHOL 152 01/21/2018   HDL 59 01/21/2018   LDLCALC 79 01/21/2018   TRIG 59 01/21/2018   CHOLHDL 2.6 01/21/2018   - Current Diet: Cooking at home, not particularly low fat, doing well with low sodium - Denies: Chest pain, shortness of breath, myalgias.  Obesity: There is no height or weight on file to calculate BMI. Weight Management History: Diet: limited variety and excessive fat intake Exercise: moderately active  Smoking: Shecurrently smokes 5cigarettesperday.She does use snuff maybe once a day while she's cooking. Cessation Techniques Discussed - She declines cessation. I advised patient to quit, and offered support. Discussed current use pattern. Unchanged.  LEFT Knee Arthritis: Only hurts when her when it's cold out - does not take any medication for this.  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     Patient Active Problem List   Diagnosis Date Noted  . Tobacco abuse counseling 10/22/2017  . Morbid obesity ([place]) 10/22/2017  . Colon cancer screening 03/11/2016  . Productive cough 12/19/2015  . Arthritis of left knee 09/18/2015  . Type 2 diabetes mellitus with hyperglycemia, without long-term current use of insulin (  [place]) 08/22/2014  . Essential hypertension 08/22/2014  . Hyperlipidemia 08/22/2014  . Obesity (BMI 35.0-39.9 without comorbidity) 08/22/2014    Past Surgical History:  Procedure Laterality Date  . CATARACT EXTRACTION Left 05/17/14   [facility]  . CATARACT EXTRACTION W/PHACO Right 07/26/2014   Procedure: CATARACT EXTRACTION PHACO AND  INTRAOCULAR LENS PLACEMENT (IOC);  Surgeon: Leandrew Koyanagi, MD;  Location: [facility];  Service: Ophthalmology;  Laterality: Right;  DIABETIC  . CESAREAN SECTION    . FOOT SURGERY  2014    Family History  Problem Relation Age of Onset  . Diabetes Father   . Heart disease Father   . Diabetes Sister   . Hyperlipidemia Sister   . Hypertension Sister   . Hypertension Brother   . Diabetes Brother   . Diabetes Sister   . Kidney disease Sister   . Heart attack Sister   . Diabetes Sister   . Diabetes Sister   . Diabetes Brother   . Diabetes Brother   . Diabetes Brother     Social History   Socioeconomic History  . Marital status: Single    Spouse name: Not on file  . Number of children: Not on file  . Years of education: Not on file  . Highest education level: Not on file  Occupational History  . Not on file  Social Needs  . Financial resource strain: Not on file  . Food insecurity:    Worry: Not on file    Inability: Not on file  . Transportation needs:    Medical: Not on file    Non-medical: Not on file  Tobacco Use  . Smoking status: Current Every Day Smoker    Packs/day: 0.25    Years: 20.00    Pack years: 5.00    Types: Cigarettes  . Smokeless tobacco: Never Used  Substance and Sexual Activity  . Alcohol use: No    Alcohol/week: 0.0 standard drinks  . Drug use: No  . Sexual activity: Not Currently  Lifestyle  . Physical activity:    Days per week: Not on file    Minutes per session: Not on file  . Stress: Not on file  Relationships  . Social connections:    Talks on phone: Not on file    Gets together: Not on file    Attends religious service: Not on file    Active member of club or organization: Not on file    Attends meetings of clubs or organizations: Not on file    Relationship status: Not on file  . Intimate partner violence:    Fear of current or ex partner: Not on file    Emotionally abused: Not on file    Physically abused: Not  on file    Forced sexual activity: Not on file  Other Topics Concern  . Not on file  Social History Narrative  . Not on file     Current Outpatient Medications:  .  Albuterol Sulfate (PROAIR RESPICLICK) 219 (90 Base) MCG/ACT AEPB, Inhale 2 puffs into the lungs every 6 (six) hours as needed (For cough or wheezing)., Disp: 1 each, Rfl: 1 .  aspirin 81 MG tablet, Take 81 mg by mouth daily. PM, Disp: , Rfl:  .  benazepril (LOTENSIN) 40 MG tablet, TAKE 1 TABLET BY MOUTH DAILY IN THE MORNING, Disp: 90 tablet, Rfl: 1 .  blood glucose meter kit and supplies KIT, Dispense based on patient and insurance preference. Check sugars once a day fasting.  E11.69, Disp: 1 each, Rfl: 0 .  budesonide-formoterol (SYMBICORT) 80-4.5 MCG/ACT inhaler, Inhale 2 puffs into the lungs 2 (two) times daily., Disp: 1 Inhaler, Rfl: 1 .  bumetanide (BUMEX) 0.5 MG tablet, TAKE 1 TABLET BY MOUTH TWICE DAILY, Disp: 180 tablet, Rfl: 0 .  Cholecalciferol (VITAMIN D) 2000 units CAPS, Take 1 capsule (2,000 Units total) by mouth daily., Disp: 90 capsule, Rfl: 1 .  glyBURIDE (DIABETA) 5 MG tablet, Take 1 tablet (5 mg total) by mouth 2 (two) times daily with a meal., Disp: 180 tablet, Rfl: 1 .  metFORMIN (GLUCOPHAGE) 1000 MG tablet, Take 1 tablet (1,000 mg total) by mouth 2 (two) times daily., Disp: 180 tablet, Rfl: 1 .  pioglitazone (ACTOS) 30 MG tablet, Take 1 tablet (30 mg total) by mouth daily., Disp: 90 tablet, Rfl: 1 .  propranolol (INDERAL) 10 MG tablet, Take 1 tablet (10 mg total) by mouth 2 (two) times daily., Disp: 180 tablet, Rfl: 1 .  simvastatin (ZOCOR) 40 MG tablet, TAKE 1 TABLET BY MOUTH NIGHTLY AT BEDTIME, Disp: 90 tablet, Rfl: 1 .  TRULICITY 1.5 ZO/1.0RU SOPN, INJECT 1.5 MG INTO THE SKIN ONCE A WEEK., Disp: 2 mL, Rfl: 3 .  Vitamin D, Ergocalciferol, (DRISDOL) 50000 units CAPS capsule, , Disp: , Rfl: 0 .  ondansetron (ZOFRAN) 4 MG tablet, Take 1 tablet (4 mg total) by mouth every 8 (eight) hours as needed for nausea or  vomiting. (Patient not taking: Reported on 05/24/2018), Disp: 15 tablet, Rfl: 0  No Known Allergies  I personally reviewed active problem list, medication list, allergies, health maintenance, notes from last encounter, lab results with the patient/caregiver today.   ROS Constitutional: Negative for fever or weight change.  Respiratory: Negative for cough and shortness of breath.   Cardiovascular: Negative for chest pain or palpitations.  Gastrointestinal: Negative for abdominal pain, no bowel changes.  Musculoskeletal: Negative for gait problem or joint swelling.  Skin: Negative for rash.  Neurological: Negative for dizziness or headache.  No other specific complaints in a complete review of systems (except as listed in HPI above).  Objective  Virtual encounter, vitals not obtained.  There is no height or weight on file to calculate BMI.  Physical Exam  Constitutional: Patient appears well-developed and well-nourished. No distress.  HENT: Head: Normocephalic and atraumatic.  Neck: Normal range of motion. Pulmonary/Chest: Effort normal. No respiratory distress. Speaking in complete sentences Neurological: Pt is alert and oriented to person, place, and time. Coordination, speech and gait are normal.  Psychiatric: Patient has a normal mood and affect. behavior is normal. Judgment and thought content normal.  No results found for this or any previous visit (from the past 72 hour(s)).  PHQ2/9: Depression screen [facility] 2/9 05/24/2018 02/24/2018 01/21/2018 10/22/2017 03/13/2017  Decreased Interest 0 0 0 0 0  Down, Depressed, Hopeless 0 0 0 0 0  PHQ - 2 Score 0 0 0 0 0  Altered sleeping 0 0 0 0 -  Tired, decreased energy 0 0 0 0 -  Change in appetite 0 0 0 0 -  Feeling bad or failure about yourself  0 0 0 0 -  Trouble concentrating 0 0 0 0 -  Moving slowly or fidgety/restless 0 0 0 0 -  Suicidal thoughts 0 0 0 0 -  PHQ-9 Score 0 0 0 0 -  Difficult doing work/chores Not difficult at all  Not difficult at all Not difficult at all Not difficult at all -   PHQ-2/9 Result is negative.  Fall Risk: Fall Risk  05/24/2018 02/24/2018 01/21/2018 10/22/2017 03/13/2017  Falls in the past year? 0 0 0 No No  Number falls in past yr: 0 0 0 - -  Injury with Fall? 0 0 0 - -  Follow up Falls evaluation completed Falls evaluation completed - - -    Assessment & Plan  1. Type 2 diabetes mellitus with hyperglycemia, without long-term current use of insulin (HCC) - pioglitazone (ACTOS) 30 MG tablet; Take 1 tablet (30 mg total) by mouth daily.  Dispense: 90 tablet; Refill: 1 - glyBURIDE (DIABETA) 5 MG tablet; Take 1 tablet (5 mg total) by mouth 2 (two) times daily with a meal.  Dispense: 180 tablet; Refill: 1  2. Morbid obesity ([place]) Discussed importance of 150 minutes of physical activity weekly, eat two servings of fish weekly, eat one serving of tree nuts ( cashews, pistachios, pecans, almonds.Marland Kitchen) every other day, eat 6 servings of fruit/vegetables daily and drink plenty of water and avoid sweet beverages.   3. Essential hypertension - Doing well on current regimen. DASH diet discussed. - bumetanide (BUMEX) 0.5 MG tablet; Take 1 tablet (0.5 mg total) by mouth 2 (two) times daily.  Dispense: 180 tablet; Refill: 1  4. Type 2 diabetes mellitus with hyperglycemia, without long-term current use of insulin (HCC) - Doing well on current regimen.  We will maintain for the time being. - pioglitazone (ACTOS) 30 MG tablet; Take 1 tablet (30 mg total) by mouth daily.  Dispense: 90 tablet; Refill: 1 - glyBURIDE (DIABETA) 5 MG tablet; Take 1 tablet (5 mg total) by mouth 2 (two) times daily with a meal.  Dispense: 180 tablet; Refill: 1  5. Arthritis of left knee Doing well, no issues  6. Tobacco abuse counseling Not ready for cessation, 2 minute of counseling provided.  7. Pure hypercholesterolemia Doing well on current regimen, continue statin therapy.  I discussed the assessment and treatment plan  with the patient. The patient was provided an opportunity to ask questions and all were answered. The patient agreed with the plan and demonstrated an understanding of the instructions.  The patient was advised to call back or seek an in-person evaluation if the symptoms worsen or if the condition fails to improve as anticipated.  I provided 21 minutes of non-face-to-face time during this encounter.

## 2018-06-09 ENCOUNTER — Other Ambulatory Visit: Payer: Self-pay | Admitting: Nurse Practitioner

## 2018-06-09 ENCOUNTER — Other Ambulatory Visit: Payer: Self-pay | Admitting: Family Medicine

## 2018-06-09 DIAGNOSIS — E1165 Type 2 diabetes mellitus with hyperglycemia: Secondary | ICD-10-CM

## 2018-06-09 DIAGNOSIS — E669 Obesity, unspecified: Secondary | ICD-10-CM

## 2018-06-09 MED FILL — FREESTYLE LANCETS: 90 days supply | Qty: 100 | Fill #0

## 2018-06-09 MED FILL — FREESTYLE LITE TEST STRIP: 90 days supply | Qty: 100 | Fill #0

## 2018-06-09 MED FILL — TRULICITY 1.5 MG/0.5 ML PEN: 1.5 | 28 days supply | Qty: 2 | Fill #0

## 2018-06-29 ENCOUNTER — Other Ambulatory Visit: Payer: Self-pay | Admitting: Nurse Practitioner

## 2018-06-29 DIAGNOSIS — E785 Hyperlipidemia, unspecified: Secondary | ICD-10-CM

## 2018-06-29 MED FILL — SIMVASTATIN 40 MG TABLET: 40 | 90 days supply | Qty: 90 | Fill #0

## 2018-07-12 ENCOUNTER — Other Ambulatory Visit: Payer: Self-pay | Admitting: Family Medicine

## 2018-07-12 DIAGNOSIS — E1165 Type 2 diabetes mellitus with hyperglycemia: Secondary | ICD-10-CM

## 2018-07-12 DIAGNOSIS — E669 Obesity, unspecified: Secondary | ICD-10-CM

## 2018-08-09 ENCOUNTER — Other Ambulatory Visit: Payer: Self-pay | Admitting: Family Medicine

## 2018-08-09 DIAGNOSIS — E669 Obesity, unspecified: Secondary | ICD-10-CM

## 2018-08-09 DIAGNOSIS — E1165 Type 2 diabetes mellitus with hyperglycemia: Secondary | ICD-10-CM

## 2018-08-25 ENCOUNTER — Other Ambulatory Visit: Payer: Self-pay | Admitting: Family Medicine

## 2018-08-25 DIAGNOSIS — I10 Essential (primary) hypertension: Secondary | ICD-10-CM

## 2018-09-06 ENCOUNTER — Other Ambulatory Visit: Payer: Self-pay | Admitting: Family Medicine

## 2018-09-06 DIAGNOSIS — E1165 Type 2 diabetes mellitus with hyperglycemia: Secondary | ICD-10-CM

## 2018-09-06 DIAGNOSIS — E669 Obesity, unspecified: Secondary | ICD-10-CM

## 2018-09-21 ENCOUNTER — Other Ambulatory Visit: Payer: Self-pay | Admitting: Family Medicine

## 2018-09-21 DIAGNOSIS — E1165 Type 2 diabetes mellitus with hyperglycemia: Secondary | ICD-10-CM

## 2018-09-21 DIAGNOSIS — E669 Obesity, unspecified: Secondary | ICD-10-CM

## 2018-09-24 ENCOUNTER — Other Ambulatory Visit: Payer: Self-pay

## 2018-09-24 ENCOUNTER — Encounter: Payer: Self-pay | Admitting: Family Medicine

## 2018-09-24 ENCOUNTER — Ambulatory Visit (INDEPENDENT_AMBULATORY_CARE_PROVIDER_SITE_OTHER): Payer: 59 | Admitting: Family Medicine

## 2018-09-24 DIAGNOSIS — E1169 Type 2 diabetes mellitus with other specified complication: Secondary | ICD-10-CM | POA: Diagnosis not present

## 2018-09-24 DIAGNOSIS — M1712 Unilateral primary osteoarthritis, left knee: Secondary | ICD-10-CM | POA: Diagnosis not present

## 2018-09-24 DIAGNOSIS — E785 Hyperlipidemia, unspecified: Secondary | ICD-10-CM

## 2018-09-24 DIAGNOSIS — Z716 Tobacco abuse counseling: Secondary | ICD-10-CM

## 2018-09-24 DIAGNOSIS — E1165 Type 2 diabetes mellitus with hyperglycemia: Secondary | ICD-10-CM

## 2018-09-24 DIAGNOSIS — I10 Essential (primary) hypertension: Secondary | ICD-10-CM

## 2018-09-24 NOTE — Progress Notes (Signed)
Name: Tracey Tucker   MRN: [id number]    DOB: [birth year]   Date:09/24/2018       Progress Note  Subjective  Chief Complaint  Chief Complaint  Patient presents with  . Diabetes    follow up  . Hyperlipidemia  . Hypertension    I connected with  Corinna Capra  on 09/24/18 at 10:00 AM EDT by a video enabled telemedicine application and verified that I am speaking with the correct person using two identifiers.  I discussed the limitations of evaluation and management by telemedicine and the availability of in person appointments. The patient expressed understanding and agreed to proceed. Staff also discussed with the patient that there may be a patient responsible charge related to this service. Patient Location: Home Provider Location: Office Additional Individuals present: none  HPI  Diabetes mellitus type 2 Checking sugars?  yes How often? A few times a week. Range (low to high) over last two weeks:  120's-150's. Does patient feel additional teaching/training would be helpful?  no  Have they attended Diabetes education classes? no  Trying to limit white bread, white rice, white potatoes, sweets?  yes Trying to limit sweetened drinks like iced tea, soft drinks, sports drinks, fruit juices?  yes Checking feet every day/night?  yes Last eye exam:  04/06/2018 Denies: Polyuria, polydipsia, polyphagia, vision changes, or neuropathy. Most recent A1C: We will wait until next visit for repeat A1C due to COVID-19 Pandemic as her A1C has been well controlled for over a year now. We will recheck today. Last CMP Results : is due for repeat today Urine Micro UTD? Yes Current Medication Management: Diabetic Medications: Metformin and Trulicity, glyburide, and actos. ACEI/ARB: Yes Statin: Yes - taking simvastatin.  Aspirin therapy: Yes  HTN: Has not been checking at home. -does take medications as prescribed - current regimen includes benazepril '40mg'$ , propanolol BID, and bumex 0.'5mg'$  which  has been her regimen for several years now so we will maitnain - taking medications as instructed, no medication side effects noted, no TIAs, no chest pain on exertion, no dyspnea on exertion, no swelling of ankles, doing well overall - DASH diet discussed - pt does follow a low sodium diet; salt not added to cooking and salt shaker on table - Unchanged.  Hyperlipidemia: Current Medication Regimen: simvastatin  Last Lipids: due for recheck - Current Diet: Cooking at home, not particularly low fat, doing well with low sodium. - Denies: Chest pain, shortness of breath, myalgias. - Unchanged today.  Obesity: Diet: limited variety and excessive fat intake Exercise: active - walking daily about 2 miles in the evenings with her daughter.  Smoking: Shecurrently smokes some days.She does use snuff maybe once a day while she's cooking.Cessation Techniques Discussed-She declines cessation.I advised patient to quit, and offered support. Stable and unchanged, not ready to quit.  LEFT Knee Arthritis: Only hurts when her when it's cold out - does not take any medication for this. 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 she notes during the warmer months she has not had any pain.  Patient Active Problem List   Diagnosis Date Noted  . Tobacco abuse counseling 10/22/2017  . Morbid obesity ([place]) 10/22/2017  . Arthritis of left knee 09/18/2015  . Type 2 diabetes mellitus with hyperglycemia, without long-term current use of insulin ([place]) 08/22/2014  . Essential hypertension 08/22/2014  . Hyperlipidemia 08/22/2014  . Obesity (BMI 35.0-39.9 without comorbidity) 08/22/2014    Past Surgical History:  Procedure  Laterality Date  . CATARACT EXTRACTION Left 05/17/14   [facility] [place]  . CATARACT EXTRACTION W/PHACO Right 07/26/2014   Procedure: CATARACT EXTRACTION PHACO AND INTRAOCULAR LENS PLACEMENT ([facility]);  Surgeon: Leandrew Koyanagi, MD;  Location: [facility];  Service:  Ophthalmology;  Laterality: Right;  DIABETIC  . CESAREAN SECTION    . FOOT SURGERY  2014    Family History  Problem Relation Age of Onset  . Diabetes Father   . Heart disease Father   . Diabetes Sister   . Hyperlipidemia Sister   . Hypertension Sister   . Hypertension Brother   . Diabetes Brother   . Diabetes Sister   . Kidney disease Sister   . Heart attack Sister   . Diabetes Sister   . Diabetes Sister   . Diabetes Brother   . Diabetes Brother   . Diabetes Brother     Social History   Socioeconomic History  . Marital status: Single    Spouse name: Not on file  . Number of children: Not on file  . Years of education: Not on file  . Highest education level: Not on file  Occupational History  . Not on file  Social Needs  . Financial resource strain: Not on file  . Food insecurity    Worry: Not on file    Inability: Not on file  . Transportation needs    Medical: Not on file    Non-medical: Not on file  Tobacco Use  . Smoking status: Current Every Day Smoker    Packs/day: 0.25    Years: 20.00    Pack years: 5.00    Types: Cigarettes  . Smokeless tobacco: Never Used  Substance and Sexual Activity  . Alcohol use: No    Alcohol/week: 0.0 standard drinks  . Drug use: No  . Sexual activity: Not Currently  Lifestyle  . Physical activity    Days per week: Not on file    Minutes per session: Not on file  . Stress: Not on file  Relationships  . Social Herbalist on phone: Not on file    Gets together: Not on file    Attends religious service: Not on file    Active member of club or organization: Not on file    Attends meetings of clubs or organizations: Not on file    Relationship status: Not on file  . Intimate partner violence    Fear of current or ex partner: Not on file    Emotionally abused: Not on file    Physically abused: Not on file    Forced sexual activity: Not on file  Other Topics Concern  . Not on file  Social History Narrative   . Not on file     Current Outpatient Medications:  .  aspirin 81 MG tablet, Take 81 mg by mouth daily. PM, Disp: , Rfl:  .  benazepril (LOTENSIN) 40 MG tablet, TAKE 1 TABLET BY MOUTH DAILY IN THE MORNING, Disp: 90 tablet, Rfl: 1 .  blood glucose meter kit and supplies KIT, Dispense based on patient and insurance preference. Check sugars once a day fasting. E11.69, Disp: 1 each, Rfl: 0 .  Blood Glucose Monitoring Suppl (FREESTYLE LITE) DEVI, USE AS DIRECTED, Disp: 1 each, Rfl: 0 .  bumetanide (BUMEX) 0.5 MG tablet, Take 1 tablet (0.5 mg total) by mouth 2 (two) times daily., Disp: 180 tablet, Rfl: 1 .  Cholecalciferol (VITAMIN D) 2000 units CAPS, Take 1 capsule (2,000  Units total) by mouth daily., Disp: 90 capsule, Rfl: 1 .  FREESTYLE LITE test strip, CHECK SUGARS ONCE A DAY, Disp: 100 each, Rfl: 0 .  glyBURIDE (DIABETA) 5 MG tablet, Take 1 tablet (5 mg total) by mouth 2 (two) times daily with a meal., Disp: 180 tablet, Rfl: 1 .  Lancets (FREESTYLE) lancets, USE DAILY, Disp: 100 each, Rfl: 0 .  metFORMIN (GLUCOPHAGE) 1000 MG tablet, TAKE 1 TABLET BY MOUTH 2 TIMES DAILY., Disp: 180 tablet, Rfl: 1 .  pioglitazone (ACTOS) 30 MG tablet, Take 1 tablet (30 mg total) by mouth daily., Disp: 90 tablet, Rfl: 1 .  propranolol (INDERAL) 10 MG tablet, TAKE 1 TABLET BY MOUTH 2 TIMES DAILY., Disp: 180 tablet, Rfl: 0 .  simvastatin (ZOCOR) 40 MG tablet, TAKE 1 TABLET BY MOUTH AT BEDTIME, Disp: 90 tablet, Rfl: 1 .  TRULICITY 1.5 IR/4.4RX SOPN, INJECT 1.5 MG INTO THE SKIN ONCE A WEEK., Disp: 2 mL, Rfl: 0  No Known Allergies  I personally reviewed active problem list, medication list, allergies, notes from last encounter, lab results with the patient/caregiver today.   ROS Constitutional: Negative for fever or weight change.  Respiratory: Negative for cough and shortness of breath.   Cardiovascular: Negative for chest pain or palpitations.  Gastrointestinal: Negative for abdominal pain, no bowel changes.   Musculoskeletal: Negative for gait problem or joint swelling.  Skin: Negative for rash.  Neurological: Negative for dizziness or headache.  No other specific complaints in a complete review of systems (except as listed in HPI above).  Objective  Virtual encounter, vitals not obtained.  There is no height or weight on file to calculate BMI.  Physical Exam  Pulmonary/Chest: Effort normal. No respiratory distress. Speaking in complete sentences Neurological: Pt is alert and oriented to person, place, and time. Coordination, speech and gait are normal.  Psychiatric: Patient has a normal mood and affect. behavior is normal. Judgment and thought content normal.  No results found for this or any previous visit (from the past 72 hour(s)).  PHQ2/9: Depression screen [facility] 2/9 09/24/2018 05/24/2018 02/24/2018 01/21/2018 10/22/2017  Decreased Interest 0 0 0 0 0  Down, Depressed, Hopeless 0 0 0 0 0  PHQ - 2 Score 0 0 0 0 0  Altered sleeping 0 0 0 0 0  Tired, decreased energy 0 0 0 0 0  Change in appetite 0 0 0 0 0  Feeling bad or failure about yourself  0 0 0 0 0  Trouble concentrating 0 0 0 0 0  Moving slowly or fidgety/restless 0 0 0 0 0  Suicidal thoughts 0 0 0 0 0  PHQ-9 Score 0 0 0 0 0  Difficult doing work/chores Not difficult at all Not difficult at all Not difficult at all Not difficult at all Not difficult at all   PHQ-2/9 Result is negative.    Fall Risk: Fall Risk  09/24/2018 05/24/2018 02/24/2018 01/21/2018 10/22/2017  Falls in the past year? 0 0 0 0 No  Number falls in past yr: 0 0 0 0 -  Injury with Fall? 0 0 0 0 -  Follow up Falls evaluation completed Falls evaluation completed Falls evaluation completed - -    Assessment & Plan  1. Type 2 diabetes mellitus with hyperglycemia, without long-term current use of insulin ([place]) - She is doing well on her current regimen and has been stable for some time on these medications, so we will maintain for now. - Hemoglobin A1c  2.  Essential  hypertension - Stable on current regimen for a few years, will maintain at this time. - COMPLETE METABOLIC PANEL WITH GFR  3. Morbid obesity ([place]) - Discussed importance of 150 minutes of physical activity weekly, eat two servings of fish weekly, eat one serving of tree nuts ( cashews, pistachios, pecans, almonds.Marland Kitchen) every other day, eat 6 servings of fruit/vegetables daily and drink plenty of water and avoid sweet beverages.  - She is walking more and doing well with this.  4. Arthritis of left knee - Stable, worse in the winters.  5. Tobacco abuse counseling - She is not ready to quit  6. Hyperlipidemia associated with type 2 diabetes mellitus ([place]) - Taking statin, due for labs. - Lipid panel    I discussed the assessment and treatment plan with the patient. The patient was provided an opportunity to ask questions and all were answered. The patient agreed with the plan and demonstrated an understanding of the instructions.  The patient was advised to call back or seek an in-person evaluation if the symptoms worsen or if the condition fails to improve as anticipated.  I provided 21 minutes of non-face-to-face time during this encounter.

## 2018-09-27 ENCOUNTER — Other Ambulatory Visit: Payer: Self-pay | Admitting: Nurse Practitioner

## 2018-10-06 ENCOUNTER — Other Ambulatory Visit: Payer: Self-pay | Admitting: Nurse Practitioner

## 2018-10-06 DIAGNOSIS — E669 Obesity, unspecified: Secondary | ICD-10-CM

## 2018-10-06 DIAGNOSIS — E1165 Type 2 diabetes mellitus with hyperglycemia: Secondary | ICD-10-CM

## 2018-10-27 ENCOUNTER — Other Ambulatory Visit: Payer: Self-pay | Admitting: Emergency Medicine

## 2018-10-27 ENCOUNTER — Encounter: Payer: 59 | Admitting: Family Medicine

## 2018-10-27 DIAGNOSIS — E785 Hyperlipidemia, unspecified: Secondary | ICD-10-CM | POA: Diagnosis not present

## 2018-10-27 DIAGNOSIS — I10 Essential (primary) hypertension: Secondary | ICD-10-CM | POA: Diagnosis not present

## 2018-10-27 DIAGNOSIS — E669 Obesity, unspecified: Secondary | ICD-10-CM

## 2018-10-27 DIAGNOSIS — E1165 Type 2 diabetes mellitus with hyperglycemia: Secondary | ICD-10-CM

## 2018-10-27 DIAGNOSIS — E1169 Type 2 diabetes mellitus with other specified complication: Secondary | ICD-10-CM | POA: Diagnosis not present

## 2018-10-27 MED ORDER — TRULICITY 1.5 MG/0.5ML ~~LOC~~ SOAJ: SUBCUTANEOUS | 1 refills | Status: DC

## 2018-10-28 LAB — COMPLETE METABOLIC PANEL WITH GFR
AG Ratio: 1.7 (calc) (ref 1.0–2.5)
ALT: 9 U/L (ref 6–29)
AST: 12 U/L (ref 10–35)
Albumin: 4 g/dL (ref 3.6–5.1)
Alkaline phosphatase (APISO): 50 U/L (ref 37–153)
BUN: 13 mg/dL (ref 7–25)
CO2: 30 mmol/L (ref 20–32)
Calcium: 9.5 mg/dL (ref 8.6–10.4)
Chloride: 101 mmol/L (ref 98–110)
Creat: 0.84 mg/dL (ref 0.50–0.99)
GFR, Est African American: 85 mL/min/{1.73_m2} (ref >=60)
GFR, Est Non African American: 73 mL/min/{1.73_m2} (ref >=60)
Globulin: 2.3 g/dL (calc) (ref 1.9–3.7)
Glucose, Bld: 127 mg/dL — ABNORMAL HIGH (ref 65–99)
Potassium: 4 mmol/L (ref 3.5–5.3)
Sodium: 139 mmol/L (ref 135–146)
Total Bilirubin: 0.3 mg/dL (ref 0.2–1.2)
Total Protein: 6.3 g/dL (ref 6.1–8.1)

## 2018-10-28 LAB — HEMOGLOBIN A1C
Hgb A1c MFr Bld: 6.6 % of total Hgb — ABNORMAL HIGH (ref <5.7)
Mean Plasma Glucose: 143 (calc)
eAG (mmol/L): 7.9 (calc)

## 2018-10-28 LAB — LIPID PANEL
Cholesterol: 134 mg/dL (ref <200)
HDL: 56 mg/dL (ref >=50)
LDL Cholesterol (Calc): 65 mg/dL (calc)
Non-HDL Cholesterol (Calc): 78 mg/dL (calc) (ref <130)
Total CHOL/HDL Ratio: 2.4 (calc) (ref <5.0)
Triglycerides: 54 mg/dL (ref <150)

## 2018-10-29 ENCOUNTER — Telehealth: Payer: Self-pay | Admitting: *Deleted

## 2018-10-29 NOTE — Telephone Encounter (Signed)
Pt given results per Raelyn Ensign, "Cholesterol looks great. A1C is stable and looks great; CMP is normal. Continue what she's doing - everything looks good. Let me know if she needs any refills."; she verbalized understanding; unable to chart in result note because no encounter created; will route to office for notification.

## 2018-11-11 ENCOUNTER — Ambulatory Visit (INDEPENDENT_AMBULATORY_CARE_PROVIDER_SITE_OTHER): Payer: 59 | Admitting: Family Medicine

## 2018-11-11 ENCOUNTER — Encounter: Payer: Self-pay | Admitting: Family Medicine

## 2018-11-11 ENCOUNTER — Other Ambulatory Visit: Payer: Self-pay

## 2018-11-11 VITALS — BP 122/76 | HR 80 | Temp 97.5°F | Resp 16 | Wt 181.0 lb

## 2018-11-11 DIAGNOSIS — M8580 Other specified disorders of bone density and structure, unspecified site: Secondary | ICD-10-CM

## 2018-11-11 DIAGNOSIS — Z0000 Encounter for general adult medical examination without abnormal findings: Secondary | ICD-10-CM | POA: Diagnosis not present

## 2018-11-11 DIAGNOSIS — E559 Vitamin D deficiency, unspecified: Secondary | ICD-10-CM

## 2018-11-11 DIAGNOSIS — Z1231 Encounter for screening mammogram for malignant neoplasm of breast: Secondary | ICD-10-CM

## 2018-11-11 NOTE — Progress Notes (Signed)
Name: Tracey Tucker   MRN: [id number]    DOB: [birth year]   Date:11/11/2018       Progress Note  Subjective  Chief Complaint  Chief Complaint  Patient presents with  . Annual Exam    HPI  Patient presents for annual CPE.  Diet: Balanced; last A1C was at goal. Exercise: Working, walking daily.  USPSTF grade A and B recommendations    Office Visit from 11/11/2018 in [facility]  AUDIT-C Score  0    No ETOH use  Depression: Phq 9 is  negative Depression screen [facility] 2/9 11/11/2018 09/24/2018 05/24/2018 02/24/2018 01/21/2018  Decreased Interest 3 0 0 0 0  Down, Depressed, Hopeless 0 0 0 0 0  PHQ - 2 Score 3 0 0 0 0  Altered sleeping 0 0 0 0 0  Tired, decreased energy 0 0 0 0 0  Change in appetite 0 0 0 0 0  Feeling bad or failure about yourself  0 0 0 0 0  Trouble concentrating 0 0 0 0 0  Moving slowly or fidgety/restless 0 0 0 0 0  Suicidal thoughts 0 0 0 0 0  PHQ-9 Score 3 0 0 0 0  Difficult doing work/chores Not difficult at all Not difficult at all Not difficult at all Not difficult at all Not difficult at all   Hypertension: BP Readings from Last 3 Encounters:  11/11/18 122/76  02/26/18 114/68  02/24/18 122/80   Obesity: Wt Readings from Last 3 Encounters:  11/11/18 181 lb (82.1 kg)  02/26/18 178 lb (80.7 kg)  02/24/18 174 lb 8 oz (79.2 kg)   BMI Readings from Last 3 Encounters:  11/11/18 36.56 kg/m  02/26/18 35.95 kg/m  02/24/18 35.24 kg/m     Hep C Screening: Due for this today - will check in next labs.  STD testing and prevention (HIV/chl/gon/syphilis): Needs HIV test - will check in next labs; declines other testing.  Intimate partner violence:No concerns Sexual History/Pain during Intercourse: No concerns Menstrual History/LMP/Abnormal Bleeding: No concerns Incontinence Symptoms: No concerns  Breast cancer:  - Last Mammogram: Needs to call to schedule; new order to be placed today - BRCA gene screening: No family  history  Osteoporosis Screening: Due for repeat DEXA this year.  Cervical cancer screening: Completed in 2018, no history of abnormal; d/c at age 63.  Skin cancer: No concerning lesions. Colorectal cancer: Denies family or personal history of colorectal cancer, no changes in BM's - no blood in stool, dark and tarry stool, mucus in stool, or constipation/diarrhea. Cologuard negative in 2019.   Lung cancer:  Smokes about 4 cigarettes a day and uses dip.  She started smoking in her 20's, used to smoke more when she was younger.   Discussed Low Dose CT Chest recommended if Age 70-80 years, 30 pack-year currently smoking OR have quit w/in 15years. Patient may qualify, but declines investigation into this at this time.   ECG: Denies chest pain, shortness of breath, palpitations. No EKG on file, not indicated today.  Advanced Care Planning: A voluntary discussion about advance care planning including the explanation and discussion of advance directives.  Discussed health care proxy and Living will, and the patient was able to identify a health care proxy as Capucine Tryon (daughter).  Patient does not have a living will at present time. If patient does have living will, I have requested they bring this to the clinic to be scanned in to their chart.  Lipids: Lab Results  Component  Value Date   CHOL 134 10/27/2018   CHOL 152 01/21/2018   CHOL 142 03/13/2017   Lab Results  Component Value Date   HDL 56 10/27/2018   HDL 59 01/21/2018   HDL 63 03/13/2017   Lab Results  Component Value Date   LDLCALC 65 10/27/2018   LDLCALC 79 01/21/2018   LDLCALC 65 03/13/2017   Lab Results  Component Value Date   TRIG 54 10/27/2018   TRIG 59 01/21/2018   TRIG 61 03/13/2017   Lab Results  Component Value Date   CHOLHDL 2.4 10/27/2018   CHOLHDL 2.6 01/21/2018   CHOLHDL 2.3 03/13/2017   No results found for: LDLDIRECT  Glucose: Glucose  Date Value Ref Range Status  10/22/2012 111 (H) 65 - 99 mg/dL  Final  07/09/2011 128 (H) 65 - 99 mg/dL Final   Glucose, Bld  Date Value Ref Range Status  10/27/2018 127 (H) 65 - 99 mg/dL Final    Comment:    .            Fasting reference interval . For someone without known diabetes, a glucose value >125 mg/dL indicates that they may have diabetes and this should be confirmed with a follow-up test. .   02/24/2018 282 (H) 65 - 99 mg/dL Final    Comment:    .            Fasting reference interval . For someone without known diabetes, a glucose value >125 mg/dL indicates that they may have diabetes and this should be confirmed with a follow-up test. .   01/21/2018 132 (H) 65 - 99 mg/dL Final    Comment:    .            Fasting reference interval . For someone without known diabetes, a glucose value >125 mg/dL indicates that they may have diabetes and this should be confirmed with a follow-up test. .    Glucose-Capillary  Date Value Ref Range Status  07/26/2014 136 (H) 65 - 99 mg/dL Final  07/26/2014 146 (H) 65 - 99 mg/dL Final    Patient Active Problem List   Diagnosis Date Noted  . Tobacco abuse counseling 10/22/2017  . Morbid obesity ([place]) 10/22/2017  . Arthritis of left knee 09/18/2015  . Type 2 diabetes mellitus with hyperglycemia, without long-term current use of insulin ([place]) 08/22/2014  . Essential hypertension 08/22/2014  . Hyperlipidemia associated with type 2 diabetes mellitus ([place]) 08/22/2014  . Obesity (BMI 35.0-39.9 without comorbidity) 08/22/2014    Past Surgical History:  Procedure Laterality Date  . CATARACT EXTRACTION Left 05/17/14   [facility] [place]  . CATARACT EXTRACTION W/PHACO Right 07/26/2014   Procedure: CATARACT EXTRACTION PHACO AND INTRAOCULAR LENS PLACEMENT (I[facility]);  Surgeon: Leandrew Koyanagi, MD;  Location: [facility];  Service: Ophthalmology;  Laterality: Right;  DIABETIC  . CESAREAN SECTION    . FOOT SURGERY  2014    Family History  Problem Relation Age of Onset  . Diabetes  Father   . Heart disease Father   . Diabetes Sister   . Hyperlipidemia Sister   . Hypertension Sister   . Hypertension Brother   . Diabetes Brother   . Diabetes Sister   . Kidney disease Sister   . Heart attack Sister   . Diabetes Sister   . Diabetes Sister   . Diabetes Brother   . Diabetes Brother   . Diabetes Brother     Social History   Socioeconomic History  . Marital  status: Single    Spouse name: Not on file  . Number of children: Not on file  . Years of education: Not on file  . Highest education level: Not on file  Occupational History  . Not on file  Social Needs  . Financial resource strain: Not on file  . Food insecurity    Worry: Not on file    Inability: Not on file  . Transportation needs    Medical: Not on file    Non-medical: Not on file  Tobacco Use  . Smoking status: Current Every Day Smoker    Packs/day: 0.25    Years: 20.00    Pack years: 5.00    Types: Cigarettes  . Smokeless tobacco: Never Used  Substance and Sexual Activity  . Alcohol use: No    Alcohol/week: 0.0 standard drinks  . Drug use: No  . Sexual activity: Not Currently  Lifestyle  . Physical activity    Days per week: Not on file    Minutes per session: Not on file  . Stress: Not on file  Relationships  . Social Herbalist on phone: Not on file    Gets together: Not on file    Attends religious service: Not on file    Active member of club or organization: Not on file    Attends meetings of clubs or organizations: Not on file    Relationship status: Not on file  . Intimate partner violence    Fear of current or ex partner: Not on file    Emotionally abused: Not on file    Physically abused: Not on file    Forced sexual activity: Not on file  Other Topics Concern  . Not on file  Social History Narrative  . Not on file     Current Outpatient Medications:  .  aspirin 81 MG tablet, Take 81 mg by mouth daily. PM, Disp: , Rfl:  .  benazepril (LOTENSIN) 40 MG  tablet, TAKE 1 TABLET BY MOUTH DAILY IN THE MORNING, Disp: 90 tablet, Rfl: 1 .  blood glucose meter kit and supplies KIT, Dispense based on patient and insurance preference. Check sugars once a day fasting. E11.69, Disp: 1 each, Rfl: 0 .  Blood Glucose Monitoring Suppl (FREESTYLE LITE) DEVI, USE AS DIRECTED, Disp: 1 each, Rfl: 0 .  bumetanide (BUMEX) 0.5 MG tablet, Take 1 tablet (0.5 mg total) by mouth 2 (two) times daily., Disp: 180 tablet, Rfl: 1 .  Cholecalciferol (VITAMIN D) 2000 units CAPS, Take 1 capsule (2,000 Units total) by mouth daily., Disp: 90 capsule, Rfl: 1 .  Dulaglutide (TRULICITY) 1.5 WU/1.3KG SOPN, INJECT 1.5 MG INTO THE SKIN ONCE A WEEK., Disp: 13 pen, Rfl: 1 .  FREESTYLE LITE test strip, CHECK SUGARS ONCE A DAY, Disp: 100 strip, Rfl: 0 .  glyBURIDE (DIABETA) 5 MG tablet, Take 1 tablet (5 mg total) by mouth 2 (two) times daily with a meal., Disp: 180 tablet, Rfl: 1 .  Lancets (FREESTYLE) lancets, USE DAILY, Disp: 100 each, Rfl: 0 .  metFORMIN (GLUCOPHAGE) 1000 MG tablet, TAKE 1 TABLET BY MOUTH 2 TIMES DAILY., Disp: 180 tablet, Rfl: 1 .  pioglitazone (ACTOS) 30 MG tablet, Take 1 tablet (30 mg total) by mouth daily., Disp: 90 tablet, Rfl: 1 .  propranolol (INDERAL) 10 MG tablet, TAKE 1 TABLET BY MOUTH 2 TIMES DAILY., Disp: 180 tablet, Rfl: 0 .  simvastatin (ZOCOR) 40 MG tablet, TAKE 1 TABLET BY MOUTH AT BEDTIME, Disp: 90  tablet, Rfl: 1  No Known Allergies   ROS  Constitutional: Negative for fever or weight change.  Respiratory: Negative for cough and shortness of breath.   Cardiovascular: Negative for chest pain or palpitations.  Gastrointestinal: Negative for abdominal pain, no bowel changes.  Musculoskeletal: Negative for gait problem or joint swelling.  Skin: Negative for rash.  Neurological: Negative for dizziness or headache.  No other specific complaints in a complete review of systems (except as listed in HPI above).  Objective  Vitals:   11/11/18 0936  BP:  122/76  Pulse: 80  Resp: 16  Temp: (!) 97.5 F (36.4 C)  SpO2: 93%  Weight: 181 lb (82.1 kg)    Body mass index is 36.56 kg/m.  Physical Exam  Constitutional: Patient appears well-developed and well-nourished. No distress.  HENT: Head: Normocephalic and atraumatic. Ears: B TMs ok, no erythema or effusion; Nose: Nose normal. Mouth/Throat: Oropharynx is clear and moist. No oropharyngeal exudate.  Eyes: Conjunctivae and EOM are normal. Pupils are equal, round, and reactive to light. No scleral icterus.  Neck: Normal range of motion. Neck supple. No JVD present. No thyromegaly present.  Cardiovascular: Normal rate, regular rhythm and normal heart sounds.  No murmur heard. No BLE edema. Pulmonary/Chest: Effort normal and breath sounds normal. No respiratory distress. Abdominal: Soft. Bowel sounds are normal, no distension. There is no tenderness. no masses Breast: no lumps or masses, no nipple discharge or rashes FEMALE GENITALIA: Deferred Musculoskeletal: Normal range of motion, no joint effusions. No gross deformities Neurological: he is alert and oriented to person, place, and time. No cranial nerve deficit. Coordination, balance, strength, speech and gait are normal.  Skin: Skin is warm and dry. No rash noted. No erythema.  Psychiatric: Patient has a normal mood and affect. behavior is normal. Judgment and thought content normal.  Recent Results (from the past 2160 hour(s))  Lipid panel     Status: None   Collection Time: 10/27/18 12:00 AM  Result Value Ref Range   Cholesterol 134 <200 mg/dL   HDL 56 > OR = 50 mg/dL   Triglycerides 54 <150 mg/dL   LDL Cholesterol (Calc) 65 mg/dL (calc)    Comment: Reference range: <100 . Desirable range <100 mg/dL for primary prevention;   <70 mg/dL for patients with CHD or diabetic patients  with > or = 2 CHD risk factors. Marland Kitchen LDL-C is now calculated using the Martin-Hopkins  calculation, which is a validated novel method providing  better  accuracy than the Friedewald equation in the  estimation of LDL-C.  Cresenciano Genre et al. Annamaria Helling. 0762;263(33): 2061-2068  (http://education.[facility].com/faq/FAQ164)    Total CHOL/HDL Ratio 2.4 <5.0 (calc)   Non-HDL Cholesterol (Calc) 78 <130 mg/dL (calc)    Comment: For patients with diabetes plus 1 major ASCVD risk  factor, treating to a non-HDL-C goal of <100 mg/dL  (LDL-C of <70 mg/dL) is considered a therapeutic  option.   Hemoglobin A1c     Status: Abnormal   Collection Time: 10/27/18 12:00 AM  Result Value Ref Range   Hgb A1c MFr Bld 6.6 (H) <5.7 % of total Hgb    Comment: For someone without known diabetes, a hemoglobin A1c value of 6.5% or greater indicates that they may have  diabetes and this should be confirmed with a follow-up  test. . For someone with known diabetes, a value <7% indicates  that their diabetes is well controlled and a value  greater than or equal to 7% indicates suboptimal  control. A1c  targets should be individualized based on  duration of diabetes, age, comorbid conditions, and  other considerations. . Currently, no consensus exists regarding use of hemoglobin A1c for diagnosis of diabetes for children. .    Mean Plasma Glucose 143 (calc)   eAG (mmol/L) 7.9 (calc)  COMPLETE METABOLIC PANEL WITH GFR     Status: Abnormal   Collection Time: 10/27/18 12:00 AM  Result Value Ref Range   Glucose, Bld 127 (H) 65 - 99 mg/dL    Comment: .            Fasting reference interval . For someone without known diabetes, a glucose value >125 mg/dL indicates that they may have diabetes and this should be confirmed with a follow-up test. .    BUN 13 7 - 25 mg/dL   Creat 0.84 0.50 - 0.99 mg/dL    Comment: For patients >53 years of age, the reference limit for Creatinine is approximately 13% higher for people identified as African-American. .    GFR, Est Non African American 73 > OR = 60 mL/min/1.49m   GFR, Est African American 85 > OR = 60  mL/min/1.776m  BUN/Creatinine Ratio NOT APPLICABLE 6 - 22 (calc)   Sodium 139 135 - 146 mmol/L   Potassium 4.0 3.5 - 5.3 mmol/L   Chloride 101 98 - 110 mmol/L   CO2 30 20 - 32 mmol/L   Calcium 9.5 8.6 - 10.4 mg/dL   Total Protein 6.3 6.1 - 8.1 g/dL   Albumin 4.0 3.6 - 5.1 g/dL   Globulin 2.3 1.9 - 3.7 g/dL (calc)   AG Ratio 1.7 1.0 - 2.5 (calc)   Total Bilirubin 0.3 0.2 - 1.2 mg/dL   Alkaline phosphatase (APISO) 50 37 - 153 U/L   AST 12 10 - 35 U/L   ALT 9 6 - 29 U/L    Diabetic Foot Exam: Diabetic Foot Exam - Simple   Simple Foot Form Diabetic Foot exam was performed with the following findings: Yes 11/11/2018 10:11 AM  Visual Inspection No deformities, no ulcerations, no other skin breakdown bilaterally: Yes Sensation Testing Intact to touch and monofilament testing bilaterally: Yes Pulse Check Posterior Tibialis and Dorsalis pulse intact bilaterally: Yes Comments     Fall Risk: Fall Risk  09/24/2018 05/24/2018 02/24/2018 01/21/2018 10/22/2017  Falls in the past year? 0 0 0 0 No  Number falls in past yr: 0 0 0 0 -  Injury with Fall? 0 0 0 0 -  Follow up Falls evaluation completed Falls evaluation completed Falls evaluation completed - -    Functional Status Survey: Is the patient deaf or have difficulty hearing?: No Does the patient have difficulty seeing, even when wearing glasses/contacts?: No Does the patient have difficulty concentrating, remembering, or making decisions?: No Does the patient have difficulty walking or climbing stairs?: No Does the patient have difficulty dressing or bathing?: No Does the patient have difficulty doing errands alone such as visiting a doctor's office or shopping?: No   Assessment & Plan  1. Well woman exam (no gynecological exam) -USPSTF grade A and B recommendations reviewed with patient; age-appropriate recommendations, preventive care, screening tests, etc discussed and encouraged; healthy living encouraged; see AVS for patient  education given to patient -Discussed importance of 150 minutes of physical activity weekly, eat two servings of fish weekly, eat one serving of tree nuts ( cashews, pistachios, pecans, almonds..)Marland Kitchenevery other day, eat 6 servings of fruit/vegetables daily and drink plenty of water and avoid sweet beverages.  -  DG Bone Density; Future - MM 3D SCREEN BREAST BILATERAL; Future  2. Vitamin D deficiency - DG Bone Density; Future  3. Osteopenia, unspecified location - DG Bone Density; Future  4. Encounter for screening mammogram for malignant neoplasm of breast - MM 3D SCREEN BREAST BILATERAL; Future

## 2018-11-26 ENCOUNTER — Other Ambulatory Visit: Payer: Self-pay | Admitting: Family Medicine

## 2018-11-26 DIAGNOSIS — I10 Essential (primary) hypertension: Secondary | ICD-10-CM

## 2018-12-22 ENCOUNTER — Other Ambulatory Visit: Payer: Self-pay | Admitting: Family Medicine

## 2018-12-22 DIAGNOSIS — E1165 Type 2 diabetes mellitus with hyperglycemia: Secondary | ICD-10-CM

## 2019-01-07 ENCOUNTER — Other Ambulatory Visit: Payer: Self-pay | Admitting: Family Medicine

## 2019-01-07 DIAGNOSIS — I10 Essential (primary) hypertension: Secondary | ICD-10-CM

## 2019-01-17 ENCOUNTER — Other Ambulatory Visit: Payer: Self-pay | Admitting: Family Medicine

## 2019-01-17 DIAGNOSIS — E1165 Type 2 diabetes mellitus with hyperglycemia: Secondary | ICD-10-CM

## 2019-01-17 DIAGNOSIS — E785 Hyperlipidemia, unspecified: Secondary | ICD-10-CM

## 2019-01-17 DIAGNOSIS — I10 Essential (primary) hypertension: Secondary | ICD-10-CM

## 2019-01-19 ENCOUNTER — Ambulatory Visit
Admission: RE | Admit: 2019-01-19 | Discharge: 2019-01-19 | Disposition: A | Discharge status: Home / Self Care | Payer: 59 | Source: Ambulatory Visit | Attending: Family Medicine | Admitting: Family Medicine

## 2019-01-19 DIAGNOSIS — M8580 Other specified disorders of bone density and structure, unspecified site: Secondary | ICD-10-CM | POA: Diagnosis not present

## 2019-01-19 DIAGNOSIS — Z0000 Encounter for general adult medical examination without abnormal findings: Secondary | ICD-10-CM | POA: Insufficient documentation

## 2019-01-19 DIAGNOSIS — E559 Vitamin D deficiency, unspecified: Secondary | ICD-10-CM | POA: Insufficient documentation

## 2019-01-19 DIAGNOSIS — Z1231 Encounter for screening mammogram for malignant neoplasm of breast: Secondary | ICD-10-CM | POA: Insufficient documentation

## 2019-01-19 DIAGNOSIS — M85852 Other specified disorders of bone density and structure, left thigh: Secondary | ICD-10-CM | POA: Diagnosis not present

## 2019-01-19 DIAGNOSIS — Z780 Asymptomatic menopausal state: Secondary | ICD-10-CM | POA: Diagnosis not present

## 2019-01-28 ENCOUNTER — Other Ambulatory Visit: Payer: Self-pay

## 2019-01-28 MED ORDER — FREESTYLE LANCETS MISC: 3 refills | Status: DC

## 2019-01-31 ENCOUNTER — Other Ambulatory Visit: Payer: Self-pay | Admitting: Family Medicine

## 2019-02-14 ENCOUNTER — Ambulatory Visit: Payer: 59 | Admitting: Family Medicine

## 2019-02-17 ENCOUNTER — Other Ambulatory Visit: Payer: Self-pay

## 2019-02-17 ENCOUNTER — Ambulatory Visit (INDEPENDENT_AMBULATORY_CARE_PROVIDER_SITE_OTHER): Payer: 59 | Admitting: Family Medicine

## 2019-02-17 ENCOUNTER — Encounter: Payer: Self-pay | Admitting: Family Medicine

## 2019-02-17 DIAGNOSIS — E669 Obesity, unspecified: Secondary | ICD-10-CM | POA: Diagnosis not present

## 2019-02-17 DIAGNOSIS — I10 Essential (primary) hypertension: Secondary | ICD-10-CM

## 2019-02-17 DIAGNOSIS — E785 Hyperlipidemia, unspecified: Secondary | ICD-10-CM | POA: Diagnosis not present

## 2019-02-17 DIAGNOSIS — M1712 Unilateral primary osteoarthritis, left knee: Secondary | ICD-10-CM | POA: Diagnosis not present

## 2019-02-17 DIAGNOSIS — E1165 Type 2 diabetes mellitus with hyperglycemia: Secondary | ICD-10-CM

## 2019-02-17 DIAGNOSIS — E1169 Type 2 diabetes mellitus with other specified complication: Secondary | ICD-10-CM

## 2019-02-17 DIAGNOSIS — Z716 Tobacco abuse counseling: Secondary | ICD-10-CM

## 2019-02-17 NOTE — Progress Notes (Signed)
Name: Tracey Tucker   MRN: [id number]    DOB: 1955-11-10   Date:02/17/2019       Progress Note  Subjective  Chief Complaint  Chief Complaint  Patient presents with  . Diabetes    4 month follow up  . Hyperlipidemia  . Hypertension    I connected with  Corinna Capra on 02/17/19 at 10:00 AM EST by telephone and verified that I am speaking with the correct person using two identifiers.  I discussed the limitations, risks, security and privacy concerns of performing an evaluation and management service by telephone and the availability of in person appointments. Staff also discussed with the patient that there may be a patient responsible charge related to this service. Patient Location: Home Provider Location: Office Additional Individuals present: None  HPI  Diabetes mellitustype 2 Checking sugars?yes How often?A few times a week. Range (low to high) over last two weeks:116-145 Diet: She does limit portions, limiting sweets and carbohydrates. Checking feet every day/night?yes Last eye exam:04/06/2018 Denies: Polyuria, polydipsia, polyphagia, vision changes, or neuropathy. Most recent A1C:We will wait until next visit for repeat A1C due to COVID-19 Pandemic as her A1C has been well controlled for over a year now.We will recheck today. Last CMP Results :isdue for repeat today Urine Micro UTD?Yes Current Medication Management: Diabetic Medications:Metformin and Trulicity, glyburide, and actos.  She has been on this for quite some time and this works well for her - does not want to change at this time.  ACEI/ARB:Yes Statin:Yes - taking simvastatin.  Aspirin therapy:Yes  HTN:Has not been checking at home - will have her sister check it when she is able.  -doestake medications as prescribed - current regimen includes benazepril 100m, propanolol BID, and bumex 0.577mwhich has been her regimen for several years now so we will maintain.  -taking medications as  instructed, no medication side effects noted, no TIAs, no chest pain on exertion, no dyspnea on exertion, no swelling of ankles.  No concerns at this time.  - DASH diet discussed - ptdoesfollow a low sodium diet; salt not added to cooking and salt shaker on table -  Doing well with no changes.   Hyperlipidemia: Current Medication Regimen:simvastatin Last Lipids: Were at goal, LDL was 65. - Current Diet:Cooking at home, not particularly low fat, doing well with low sodium. -Denies: Chest pain, shortness of breath, myalgias. - No changes today.  Obesity: Diet:limited variety and excessive fat intake Exercise:active - walking daily about 2 miles in the evenings with her daughter.  Smoking: Shecurrently smokes some days.She does use snuff about once a day while she's cooking.Cessation Techniques Discussed-She declines cessation.I advised patient to quit, and offered support. Stable and unchanged, still not ready to quit.   LEFT Knee Arthritis: Only hurts when her when it's cold out - does not take any medication for this. 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 Advised may take tylenol PRN.   Patient Active Problem List   Diagnosis Date Noted  . Tobacco abuse counseling 10/22/2017  . Morbid obesity ([place]09/01/2018  . Arthritis of left knee 09/18/2015  . Type 2 diabetes mellitus with hyperglycemia, without long-term current use of insulin ([facility]07/01/2015  . Essential hypertension 08/22/2014  . Hyperlipidemia associated with type 2 diabetes mellitus ([facility] [place]07/01/2015  . Obesity (BMI 35.0-39.9 without comorbidity) 08/22/2014    Past Surgical History:  Procedure Laterality Date  . CATARACT EXTRACTION Left 05/17/14   [facility] [place]  . CATARACT  EXTRACTION W/PHACO Right 07/26/2014   Procedure: CATARACT EXTRACTION PHACO AND INTRAOCULAR LENS PLACEMENT (IOC);  Surgeon: Leandrew Koyanagi, MD;  Location: [place];  Service: Ophthalmology;   Laterality: Right;  DIABETIC  . CESAREAN SECTION    . FOOT SURGERY  2014    Family History  Problem Relation Age of Onset  . Diabetes Father   . Heart disease Father   . Diabetes Sister   . Hyperlipidemia Sister   . Hypertension Sister   . Hypertension Brother   . Diabetes Brother   . Diabetes Sister   . Kidney disease Sister   . Heart attack Sister   . Diabetes Sister   . Diabetes Sister   . Diabetes Brother   . Diabetes Brother   . Diabetes Brother     Social History   Socioeconomic History  . Marital status: Single    Spouse name: Not on file  . Number of children: Not on file  . Years of education: Not on file  . Highest education level: Not on file  Occupational History  . Not on file  Tobacco Use  . Smoking status: Current Every Day Smoker    Packs/day: 0.25    Years: 20.00    Pack years: 5.00    Types: Cigarettes  . Smokeless tobacco: Never Used  Substance and Sexual Activity  . Alcohol use: No    Alcohol/week: 0.0 standard drinks  . Drug use: No  . Sexual activity: Not Currently  Other Topics Concern  . Not on file  Social History Narrative  . Not on file   Social Determinants of Health   Financial Resource Strain:   . Difficulty of Paying Living Expenses: Not on file  Food Insecurity:   . Worried About Charity fundraiser in the Last Year: Not on file  . Ran Out of Food in the Last Year: Not on file  Transportation Needs:   . Lack of Transportation (Medical): Not on file  . Lack of Transportation (Non-Medical): Not on file  Physical Activity:   . Days of Exercise per Week: Not on file  . Minutes of Exercise per Session: Not on file  Stress:   . Feeling of Stress : Not on file  Social Connections:   . Frequency of Communication with Friends and Family: Not on file  . Frequency of Social Gatherings with Friends and Family: Not on file  . Attends Religious Services: Not on file  . Active Member of Clubs or Organizations: Not on file  .  Attends Archivist Meetings: Not on file  . Marital Status: Not on file  Intimate Partner Violence:   . Fear of Current or Ex-Partner: Not on file  . Emotionally Abused: Not on file  . Physically Abused: Not on file  . Sexually Abused: Not on file     Current Outpatient Medications:  .  aspirin 81 MG tablet, Take 81 mg by mouth daily. PM, Disp: , Rfl:  .  benazepril (LOTENSIN) 40 MG tablet, TAKE 1 TABLET BY MOUTH DAILY IN THE MORNING, Disp: 90 tablet, Rfl: 0 .  blood glucose meter kit and supplies KIT, Dispense based on patient and insurance preference. Check sugars once a day fasting. E11.69, Disp: 1 each, Rfl: 0 .  Blood Glucose Monitoring Suppl (FREESTYLE LITE) DEVI, USE AS DIRECTED, Disp: 1 each, Rfl: 0 .  bumetanide (BUMEX) 0.5 MG tablet, TAKE 1 TABLET BY MOUTH TWO TIMES DAILY, Disp: 180 tablet, Rfl: 0 .  Cholecalciferol (VITAMIN D) 2000 units CAPS, Take 1 capsule (2,000 Units total) by mouth daily., Disp: 90 capsule, Rfl: 1 .  Dulaglutide (TRULICITY) 1.5 SW/9.6PR SOPN, INJECT 1.5 MG INTO THE SKIN ONCE A WEEK., Disp: 13 pen, Rfl: 1 .  FREESTYLE LITE test strip, CHECK SUGARS ONCE A DAY, Disp: 100 strip, Rfl: 0 .  glyBURIDE (DIABETA) 5 MG tablet, TAKE 1 TABLET BY MOUTH TWO TIMES DAILY WITH A MEAL, Disp: 180 tablet, Rfl: 0 .  Lancets (FREESTYLE) lancets, USE DAILY, Disp: 100 each, Rfl: 3 .  metFORMIN (GLUCOPHAGE) 1000 MG tablet, TAKE 1 TABLET BY MOUTH 2 TIMES DAILY., Disp: 180 tablet, Rfl: 1 .  pioglitazone (ACTOS) 30 MG tablet, Take 1 tablet (30 mg total) by mouth daily., Disp: 90 tablet, Rfl: 1 .  propranolol (INDERAL) 10 MG tablet, TAKE 1 TABLET BY MOUTH 2 TIMES DAILY., Disp: 180 tablet, Rfl: 0 .  simvastatin (ZOCOR) 40 MG tablet, TAKE 1 TABLET BY MOUTH AT BEDTIME, Disp: 90 tablet, Rfl: 0  No Known Allergies  I personally reviewed active problem list, medication list, allergies, health maintenance, notes from last encounter, lab results with the patient/caregiver  today.   ROS  Constitutional: Negative for fever or weight change.  Respiratory: Negative for cough and shortness of breath.   Cardiovascular: Negative for chest pain or palpitations.  Gastrointestinal: Negative for abdominal pain, no bowel changes.  Musculoskeletal: Negative for gait problem or joint swelling.  Skin: Negative for rash.  Neurological: Negative for dizziness or headache.  No other specific complaints in a complete review of systems (except as listed in HPI above).  Objective  Virtual encounter, vitals not obtained.  There is no height or weight on file to calculate BMI.  Physical Exam  Pulmonary/Chest: Effort normal. No respiratory distress. Speaking in complete sentences Neurological: Pt is alert and oriented to person, place, and time. Speech is normal Psychiatric: Patient has a normal mood and affect. behavior is normal. Judgment and thought content normal.  No results found for this or any previous visit (from the past 72 hour(s)).  PHQ2/9: Depression screen [facility] 2/9 11/11/2018 09/24/2018 05/24/2018 02/24/2018 01/21/2018  Decreased Interest 3 0 0 0 0  Down, Depressed, Hopeless 0 0 0 0 0  PHQ - 2 Score 3 0 0 0 0  Altered sleeping 0 0 0 0 0  Tired, decreased energy 0 0 0 0 0  Change in appetite 0 0 0 0 0  Feeling bad or failure about yourself  0 0 0 0 0  Trouble concentrating 0 0 0 0 0  Moving slowly or fidgety/restless 0 0 0 0 0  Suicidal thoughts 0 0 0 0 0  PHQ-9 Score 3 0 0 0 0  Difficult doing work/chores Not difficult at all Not difficult at all Not difficult at all Not difficult at all Not difficult at all   PHQ-2/9 Result is negative.    Fall Risk: Fall Risk  02/17/2019 09/24/2018 05/24/2018 02/24/2018 01/21/2018  Falls in the past year? 0 0 0 0 0  Number falls in past yr: 0 0 0 0 0  Injury with Fall? 0 0 0 0 0  Follow up Falls evaluation completed Falls evaluation completed Falls evaluation completed Falls evaluation completed -    Assessment &  Plan  1. Essential hypertension - Stable on current regimen, has been on for years and wants to remain on bumex at this time.  2. Type 2 diabetes mellitus with hyperglycemia, without long-term current use of insulin ([place]) -  Stable at last check, due for A1C, BG's have been stable.  Needs to schedule eye exam and she is reminded of this. - Hemoglobin A1c  3. Hyperlipidemia associated with type 2 diabetes mellitus ([place]) - Taking statin and doing well.  Last lipids at goal 3 months ago, recheck at next visit.  4. Arthritis of left knee - Doing well without medication; advised may take PRN tylenol.  5. Obesity (BMI 35.0-39.9 without comorbidity) - Discussed importance of 150 minutes of physical activity weekly, eat two servings of fish weekly, eat one serving of tree nuts ( cashews, pistachios, pecans, almonds.Marland Kitchen) every other day, eat 6 servings of fruit/vegetables daily and drink plenty of water and avoid sweet beverages.   6. Morbid obesity ([place]) - See above regarding teaching.    7. Tobacco abuse counseling - Discussed importance of smoking cessation as well as different options available.  Encouraged smoking cessation - not ready to quit at this time.   I discussed the assessment and treatment plan with the patient. The patient was provided an opportunity to ask questions and all were answered. The patient agreed with the plan and demonstrated an understanding of the instructions.   The patient was advised to call back or seek an in-person evaluation if the symptoms worsen or if the condition fails to improve as anticipated.  I provided 21 minutes of non-face-to-face time during this encounter.  Hubbard Hartshorn, FNP

## 2019-03-02 ENCOUNTER — Other Ambulatory Visit: Payer: Self-pay | Admitting: Family Medicine

## 2019-03-02 DIAGNOSIS — I10 Essential (primary) hypertension: Secondary | ICD-10-CM

## 2019-03-24 ENCOUNTER — Other Ambulatory Visit: Payer: Self-pay | Admitting: Family Medicine

## 2019-03-24 DIAGNOSIS — E1165 Type 2 diabetes mellitus with hyperglycemia: Secondary | ICD-10-CM

## 2019-03-24 DIAGNOSIS — E669 Obesity, unspecified: Secondary | ICD-10-CM

## 2019-04-13 ENCOUNTER — Other Ambulatory Visit: Payer: Self-pay | Admitting: Family Medicine

## 2019-04-13 DIAGNOSIS — I10 Essential (primary) hypertension: Secondary | ICD-10-CM

## 2019-04-14 ENCOUNTER — Other Ambulatory Visit: Payer: Self-pay | Admitting: Family Medicine

## 2019-04-14 DIAGNOSIS — E1165 Type 2 diabetes mellitus with hyperglycemia: Secondary | ICD-10-CM

## 2019-04-14 DIAGNOSIS — I10 Essential (primary) hypertension: Secondary | ICD-10-CM

## 2019-04-14 NOTE — Telephone Encounter (Signed)
Requested Prescriptions  Pending Prescriptions Disp Refills  . pioglitazone (ACTOS) 30 MG tablet [Pharmacy Med Name: PIOGLITAZONE HCL 30 MG TAB 30 Tablet] 90 tablet 1    Sig: TAKE 1 TABLET BY MOUTH ONCE A DAY     Endocrinology:  Diabetes - Glitazones - pioglitazone Passed - 04/14/2019  7:39 AM      Passed - HBA1C is between 0 and 7.9 and within 180 days    Hgb A1c MFr Bld  Date Value Ref Range Status  10/27/2018 6.6 (H) <5.7 % of total Hgb Final    Comment:    For someone without known diabetes, a hemoglobin A1c value of 6.5% or greater indicates that they may have  diabetes and this should be confirmed with a follow-up  test. . For someone with known diabetes, a value <7% indicates  that their diabetes is well controlled and a value  greater than or equal to 7% indicates suboptimal  control. A1c targets should be individualized based on  duration of diabetes, age, comorbid conditions, and  other considerations. . Currently, no consensus exists regarding use of hemoglobin A1c for diagnosis of diabetes for children. Verna Czech - Valid encounter within last 6 months    Recent Outpatient Visits          1 month ago Essential hypertension   [facility] Doren Custard, FNP   5 months ago Well woman exam (no gynecological exam)   [facility] Doren Custard, FNP   6 months ago Type 2 diabetes mellitus with hyperglycemia, without long-term current use of insulin [facility])   [facility] Doren Custard, FNP   10 months ago Arthritis of left knee   [facility] Doren Custard, FNP   1 year ago Syncope, unspecified syncope type   [facility] Doren Custard, FNP      Future Appointments            In 4 months Doren Custard, FNP [facility], [facility]           . benazepril (LOTENSIN) 40 MG tablet [Pharmacy Med Name: BENAZEPRIL HCL 40 MG TABLET 40 Tablet] 90 tablet  0    Sig: TAKE 1 TABLET BY MOUTH DAILY IN THE MORNING     Cardiovascular:  ACE Inhibitors Passed - 04/14/2019  7:39 AM      Passed - Cr in normal range and within 180 days    Creat  Date Value Ref Range Status  10/27/2018 0.84 0.50 - 0.99 mg/dL Final    Comment:    For patients >85 years of age, the reference limit for Creatinine is approximately 13% higher for people identified as African-American. .    Creatinine, Urine  Date Value Ref Range Status  01/21/2018 57 20 - 275 mg/dL Final         Passed - K in normal range and within 180 days    Potassium  Date Value Ref Range Status  10/27/2018 4.0 3.5 - 5.3 mmol/L Final  10/22/2012 3.9 3.5 - 5.1 mmol/L Final         Passed - Patient is not pregnant      Passed - Last BP in normal range    BP Readings from Last 1 Encounters:  11/11/18 122/76         Passed - Valid encounter within last 6 months  Recent Outpatient Visits          1 month ago Essential hypertension   [place] [place], FNP   5 months ago Well woman exam (no gynecological exam)   [place], FNP   6 months ago Type 2 diabetes mellitus with hyperglycemia, without long-term current use of insulin [facility])   [place], FNP   10 months ago Arthritis of left knee   [place], FNP   1 year ago Syncope, unspecified syncope type   [facility] Hubbard Hartshorn, FNP      Future Appointments            In 4 months Hubbard Hartshorn, [facility], [facility]

## 2019-04-18 ENCOUNTER — Other Ambulatory Visit: Payer: Self-pay | Admitting: Family Medicine

## 2019-04-18 DIAGNOSIS — E1165 Type 2 diabetes mellitus with hyperglycemia: Secondary | ICD-10-CM

## 2019-04-18 DIAGNOSIS — E669 Obesity, unspecified: Secondary | ICD-10-CM

## 2019-04-18 DIAGNOSIS — E785 Hyperlipidemia, unspecified: Secondary | ICD-10-CM

## 2019-04-28 ENCOUNTER — Other Ambulatory Visit: Payer: Self-pay | Admitting: Family Medicine

## 2019-05-30 ENCOUNTER — Other Ambulatory Visit: Payer: Self-pay | Admitting: Family Medicine

## 2019-05-30 DIAGNOSIS — I10 Essential (primary) hypertension: Secondary | ICD-10-CM

## 2019-05-30 NOTE — Telephone Encounter (Signed)
Requested Prescriptions  Pending Prescriptions Disp Refills  . propranolol (INDERAL) 10 MG tablet [Pharmacy Med Name: PROPRANOLOL 10 MG TABLET 10 Tablet] 180 tablet 0    Sig: TAKE 1 TABLET BY MOUTH 2 TIMES DAILY     Cardiovascular:  Beta Blockers Failed - 05/30/2019  7:22 AM      Failed - Valid encounter within last 6 months    Recent Outpatient Visits          3 months ago Essential hypertension   [facility] Doren Custard, FNP   6 months ago Well woman exam (no gynecological exam)   [facility] Maurice [place], FNP   8 months ago Type 2 diabetes mellitus with hyperglycemia, without long-term current use of insulin [facility])   [facility] Doren Custard, FNP   1 year ago Arthritis of left knee   [facility] Doren Custard, FNP   1 year ago Syncope, unspecified syncope type   [facility] Doren Custard, FNP      Future Appointments            In 2 months Doren Custard, FNP [facility]           Passed - Last BP in normal range    BP Readings from Last 1 Encounters:  11/11/18 122/76         Passed - Last Heart Rate in normal range    Pulse Readings from Last 1 Encounters:  11/11/18 80

## 2019-06-13 ENCOUNTER — Other Ambulatory Visit: Payer: Self-pay | Admitting: Family Medicine

## 2019-06-13 DIAGNOSIS — E1165 Type 2 diabetes mellitus with hyperglycemia: Secondary | ICD-10-CM

## 2019-06-13 DIAGNOSIS — E669 Obesity, unspecified: Secondary | ICD-10-CM

## 2019-06-20 ENCOUNTER — Other Ambulatory Visit: Payer: Self-pay | Admitting: Family Medicine

## 2019-06-20 DIAGNOSIS — E1165 Type 2 diabetes mellitus with hyperglycemia: Secondary | ICD-10-CM

## 2019-07-12 ENCOUNTER — Other Ambulatory Visit: Payer: Self-pay

## 2019-07-12 DIAGNOSIS — I10 Essential (primary) hypertension: Secondary | ICD-10-CM

## 2019-07-14 ENCOUNTER — Other Ambulatory Visit: Payer: Self-pay

## 2019-07-14 ENCOUNTER — Telehealth: Payer: Self-pay | Admitting: Family Medicine

## 2019-07-14 DIAGNOSIS — I10 Essential (primary) hypertension: Secondary | ICD-10-CM

## 2019-07-14 MED ORDER — BUMETANIDE 0.5 MG PO TABS: 0.5000 mg | ORAL_TABLET | Freq: Two times a day (BID) | ORAL | 0 refills | Status: DC

## 2019-07-14 NOTE — Telephone Encounter (Signed)
lvm for pt to resch her July appt with [facility] for a sooner appt. meds will not be refilled until seen.

## 2019-07-14 NOTE — Telephone Encounter (Signed)
Sent in enough for her to make it til appt on Monday

## 2019-07-14 NOTE — Telephone Encounter (Signed)
Pt is completely out of her fluid pill. She has an appt with Dr Carlynn Purl on Monday, first available with any dr. Rock Nephew is requesting enough until then. She is supposed to take another fluid pill today and is concerned about not having it today to take,

## 2019-07-18 ENCOUNTER — Ambulatory Visit: Payer: 59 | Admitting: Family Medicine

## 2019-07-18 ENCOUNTER — Other Ambulatory Visit: Payer: Self-pay | Admitting: Family Medicine

## 2019-07-18 ENCOUNTER — Encounter: Payer: Self-pay | Admitting: Family Medicine

## 2019-07-18 ENCOUNTER — Other Ambulatory Visit: Payer: Self-pay

## 2019-07-18 VITALS — BP 112/82 | HR 95 | Temp 97.6°F | Resp 16 | Ht 59.0 in | Wt 188.3 lb

## 2019-07-18 DIAGNOSIS — E1169 Type 2 diabetes mellitus with other specified complication: Secondary | ICD-10-CM

## 2019-07-18 DIAGNOSIS — E669 Obesity, unspecified: Secondary | ICD-10-CM

## 2019-07-18 DIAGNOSIS — E1159 Type 2 diabetes mellitus with other circulatory complications: Secondary | ICD-10-CM | POA: Diagnosis not present

## 2019-07-18 DIAGNOSIS — I10 Essential (primary) hypertension: Secondary | ICD-10-CM

## 2019-07-18 DIAGNOSIS — E1165 Type 2 diabetes mellitus with hyperglycemia: Secondary | ICD-10-CM

## 2019-07-18 DIAGNOSIS — E785 Hyperlipidemia, unspecified: Secondary | ICD-10-CM | POA: Diagnosis not present

## 2019-07-18 DIAGNOSIS — E559 Vitamin D deficiency, unspecified: Secondary | ICD-10-CM

## 2019-07-18 DIAGNOSIS — J410 Simple chronic bronchitis: Secondary | ICD-10-CM

## 2019-07-18 DIAGNOSIS — I152 Hypertension secondary to endocrine disorders: Secondary | ICD-10-CM

## 2019-07-18 LAB — POCT GLYCOSYLATED HEMOGLOBIN (HGB A1C): Hemoglobin A1C: 6.9 % — AB (ref 4.0–5.6)

## 2019-07-18 MED ORDER — TRULICITY 3 MG/0.5ML ~~LOC~~ SOAJ: 3.0000 mg | SUBCUTANEOUS | 2 refills | Status: DC

## 2019-07-18 MED ORDER — SIMVASTATIN 40 MG PO TABS: 40.0000 mg | ORAL_TABLET | Freq: Every day | ORAL | 0 refills | Status: DC

## 2019-07-18 MED ORDER — VALSARTAN-HYDROCHLOROTHIAZIDE 160-25 MG PO TABS: 1.0000 | ORAL_TABLET | Freq: Every day | ORAL | 0 refills | Status: DC

## 2019-07-18 MED ORDER — BLOOD GLUCOSE METER KIT: PACK | 2 refills | Status: DC

## 2019-07-18 MED ORDER — SPIRIVA HANDIHALER 18 MCG IN CAPS: 18.0000 ug | ORAL_CAPSULE | Freq: Every day | RESPIRATORY_TRACT | 2 refills | Status: DC

## 2019-07-18 MED ORDER — PROPRANOLOL HCL 10 MG PO TABS: 10.0000 mg | ORAL_TABLET | Freq: Two times a day (BID) | ORAL | 0 refills | Status: DC

## 2019-07-18 MED ORDER — XIGDUO XR 10-1000 MG PO TB24: 1.0000 | ORAL_TABLET | Freq: Every day | ORAL | 2 refills | Status: DC

## 2019-07-18 NOTE — Progress Notes (Signed)
Name: Tracey Tucker   MRN: [id number]    DOB: 1955-03-26   Date:07/18/2019       Progress Note  Subjective  Chief Complaint  Chief Complaint  Patient presents with  . Medication Refill    hypertension    HPI  DMII: She is currently on Trulicity, Actos , metformin and Glyburide BID. Last A1C was 6.6%, today A1C was 6.9 %. She has been on the same regiment for years, discussed trying to stop medications that cause weight gain such as Glyburide and Actos and she agreed. We will switch to higher dose Trulicity and add Xigduo. She denies polyphagia, polydipsia or polyuria. Eye exam is up to date, we will urine micro today. She has dyslipidemia, but last LDL was at goal. She is on Simvastatin and denies side effects. She has episodes of hypoglycemia, she usually wakes up feeling clammy and shake before breakfast   HTN: she has hypertension, compliant with medication, Denies chest pain, dizziness  palpitation or SOB  Dyslipidemia: she is on statin therapy, last LDL was 65, denies myalgia   Morbid Obesity: BMI above 35 with co-morbidities such as DM, HTN, dyslipidemia, explained importance of weight loss.Discussed increasing dose of Trulicity , also portion control, she is also walking    Chronic bronchitis: she has been smoking for 29 years, she has a daily cough, is wet but does not expectorate, occasional wheezing but no sob. She only uses an inhaler when she has acute flare. Discussed medications and lung CT, she is willing to try medication first   Patient Active Problem List   Diagnosis Date Noted  . Tobacco abuse counseling 10/22/2017  . Morbid obesity ([place]) 10/22/2017  . Arthritis of left knee 09/18/2015  . Type 2 diabetes mellitus with hyperglycemia, without long-term current use of insulin ([place]) 08/22/2014  . Hypertension associated with diabetes ([place]) 08/22/2014  . Hyperlipidemia associated with type 2 diabetes mellitus ([place]) 08/22/2014  . Obesity (BMI 35.0-39.9 without comorbidity)  08/22/2014    Past Surgical History:  Procedure Laterality Date  . CATARACT EXTRACTION Left 05/17/14   [facility]  . CATARACT EXTRACTION W/PHACO Right 07/26/2014   Procedure: CATARACT EXTRACTION PHACO AND INTRAOCULAR LENS PLACEMENT (IOC);  Surgeon: Leandrew Koyanagi, MD;  Location: [facility];  Service: Ophthalmology;  Laterality: Right;  DIABETIC  . CESAREAN SECTION    . FOOT SURGERY  2014    Family History  Problem Relation Age of Onset  . Diabetes Father   . Heart disease Father   . Diabetes Sister   . Hyperlipidemia Sister   . Hypertension Sister   . Hypertension Brother   . Diabetes Brother   . Diabetes Sister   . Kidney disease Sister   . Heart attack Sister   . Diabetes Sister   . Diabetes Sister   . Diabetes Brother   . Diabetes Brother   . Diabetes Brother     Social History   Tobacco Use  . Smoking status: Current Every Day Smoker    Packs/day: 1.00    Years: 29.00    Pack years: 29.00    Types: Cigarettes  . Smokeless tobacco: Current User    Types: Snuff  Substance Use Topics  . Alcohol use: No    Alcohol/week: 0.0 standard drinks     Current Outpatient Medications:  .  aspirin 81 MG tablet, Take 81 mg by mouth daily. PM, Disp: , Rfl:  .  blood glucose meter kit and supplies KIT, Dispense based on patient  and insurance preference. Check sugars once a day fasting. E11.69, Disp: 1 each, Rfl: 0 .  Blood Glucose Monitoring Suppl (FREESTYLE LITE) DEVI, USE AS DIRECTED, Disp: 1 each, Rfl: 0 .  Cholecalciferol (VITAMIN D) 2000 units CAPS, Take 1 capsule (2,000 Units total) by mouth daily., Disp: 90 capsule, Rfl: 1 .  FREESTYLE LITE test strip, CHECK SUGARS ONCE A DAY, Disp: 100 strip, Rfl: 2 .  Lancets (FREESTYLE) lancets, USE DAILY, Disp: 100 each, Rfl: 3 .  propranolol (INDERAL) 10 MG tablet, Take 1 tablet (10 mg total) by mouth 2 (two) times daily., Disp: 180 tablet, Rfl: 0 .  simvastatin (ZOCOR) 40 MG tablet, Take 1 tablet (40 mg total)  by mouth at bedtime., Disp: 90 tablet, Rfl: 0 .  blood glucose meter kit and supplies, Dispense based on patient and insurance preference. Use up to four times daily as directed. (FOR ICD-10 E10.9, E11.9)., Disp: 1 each, Rfl: 2 .  Dapagliflozin-metFORMIN HCl ER (XIGDUO XR) 11-998 MG TB24, Take 1 tablet by mouth daily. In place of metformin and glyburide, Disp: 30 tablet, Rfl: 2 .  Dulaglutide (TRULICITY) 3 JG/8.1LX SOPN, Inject 0.5 mLs (3 mg total) as directed once a week., Disp: 2 mL, Rfl: 2 .  tiotropium (SPIRIVA HANDIHALER) 18 MCG inhalation capsule, Place 1 capsule (18 mcg total) into inhaler and inhale daily., Disp: 30 capsule, Rfl: 2 .  valsartan-hydrochlorothiazide (DIOVAN HCT) 160-25 MG tablet, Take 1 tablet by mouth daily. In place of Bumex and benazepril, Disp: 90 tablet, Rfl: 0  No Known Allergies  I personally reviewed active problem list, medication list, allergies, family history, social history, health maintenance with the patient/caregiver today.   ROS  Constitutional: Negative for fever or weight change.  Respiratory: Negative for cough and shortness of breath.   Cardiovascular: Negative for chest pain or palpitations.  Gastrointestinal: Negative for abdominal pain, no bowel changes.  Musculoskeletal: Negative for gait problem or joint swelling.  Skin: Negative for rash.  Neurological: Negative for dizziness or headache.  No other specific complaints in a complete review of systems (except as listed in HPI above).  Objective  Vitals:   07/18/19 1311  BP: 112/82  Pulse: (!) 108  Resp: 16  Temp: 97.6 F (36.4 C)  TempSrc: Temporal  SpO2: 98%  Weight: 188 lb 4.8 oz (85.4 kg)  Height: '4\' 11"'$  (1.499 m)    Body mass index is 38.03 kg/m.  Physical Exam  Constitutional: Patient appears well-developed and well-nourished. Obese No distress.  HEENT: head atraumatic, normocephalic, pupils equal and reactive to light, , neck supple, oral mucosa not done   Cardiovascular: Normal rate, regular rhythm and normal heart sounds.  No murmur heard. No BLE edema. Pulmonary/Chest: Effort normal and breath sounds normal. No respiratory distress. Abdominal: Soft.  There is no tenderness. Psychiatric: Patient has a normal mood and affect. behavior is normal. Judgment and thought content normal.  PHQ2/9: Depression screen [facility] 2/9 07/18/2019 11/11/2018 09/24/2018 05/24/2018 02/24/2018  Decreased Interest 0 3 0 0 0  Down, Depressed, Hopeless 0 0 0 0 0  PHQ - 2 Score 0 3 0 0 0  Altered sleeping 0 0 0 0 0  Tired, decreased energy 0 0 0 0 0  Change in appetite 0 0 0 0 0  Feeling bad or failure about yourself  0 0 0 0 0  Trouble concentrating 0 0 0 0 0  Moving slowly or fidgety/restless 0 0 0 0 0  Suicidal thoughts 0 0 0 0 0  PHQ-9 Score 0 3 0 0 0  Difficult doing work/chores - Not difficult at all Not difficult at all Not difficult at all Not difficult at all  Some recent data might be hidden    phq 9 is negative   Fall Risk: Fall Risk  07/18/2019 02/17/2019 09/24/2018 05/24/2018 02/24/2018  Falls in the past year? 0 0 0 0 0  Number falls in past yr: - 0 0 0 0  Injury with Fall? - 0 0 0 0  Follow up - Falls evaluation completed Falls evaluation completed Falls evaluation completed Falls evaluation completed    Assessment & Plan  1. Dyslipidemia associated with type 2 diabetes mellitus (HCC)  - POCT HgB A1C  2. Hypertension associated with diabetes ([place])  - Microalbumin / creatinine urine ratio  3. Essential hypertension  - propranolol (INDERAL) 10 MG tablet; Take 1 tablet (10 mg total) by mouth 2 (two) times daily.  Dispense: 180 tablet; Refill: 0  4. Vitamin D deficiency  Continue supplementation   5. Diabetes mellitus type 2 in obese (HCC)  - POCT HgB A1C  6. Morbid obesity ([place])  Discussed with the patient the risk posed by an increased BMI. Discussed importance of portion control, calorie counting and at least 150 minutes of physical  activity weekly. Avoid sweet beverages and drink more water. Eat at least 6 servings of fruit and vegetables daily   7. Obesity (BMI 35.0-39.9 without comorbidity)  With co-morbidities : HTN, , dyslipidemia, OA, DM  8. Hyperlipidemia, unspecified hyperlipidemia type  - simvastatin (ZOCOR) 40 MG tablet; Take 1 tablet (40 mg total) by mouth at bedtime.  Dispense: 90 tablet; Refill: 0  10. Simple chronic bronchitis (HCC)  We will start spiriva and monitor, avoid dual medication since she has tachycardia, discussed importance of quitting smoking a

## 2019-07-19 LAB — MICROALBUMIN / CREATININE URINE RATIO
Creatinine, Urine: 148 mg/dL (ref 20–275)
Microalb Creat Ratio: 5 mcg/mg creat (ref <30)
Microalb, Ur: 0.8 mg/dL

## 2019-08-18 ENCOUNTER — Ambulatory Visit: Payer: 59 | Admitting: Family Medicine

## 2019-08-30 ENCOUNTER — Ambulatory Visit: Payer: 59 | Admitting: Family Medicine

## 2019-10-18 ENCOUNTER — Other Ambulatory Visit: Payer: Self-pay | Admitting: Family Medicine

## 2019-10-18 DIAGNOSIS — I10 Essential (primary) hypertension: Secondary | ICD-10-CM

## 2019-10-19 ENCOUNTER — Ambulatory Visit: Payer: 59 | Admitting: Family Medicine

## 2019-10-20 ENCOUNTER — Other Ambulatory Visit: Payer: Self-pay | Admitting: Family Medicine

## 2019-11-04 NOTE — Progress Notes (Signed)
Name: Tracey Tucker   MRN: [id number]    DOB: May 08, 1955   Date:11/07/2019       Progress Note  Subjective  Chief Complaint  Chief Complaint  Patient presents with  . Diabetes  . Dyslipidemia  . Hypertension  . Obesity  . Medication Management    Is she still suppose to be taking Actos. No longer on medication list and she does not have anymore of the medication.    HPI  DMII: She is currently on Trulicity, Actos , metformin and Glyburide BID. Last A1C was 6.6%, today A1C was 6.9 %. She was  on the same regiment for years on her last visit we stopped Actos and glyburide and increased dose of Trulicity to  3 mg higher dose Trulicity and added  Xigduo. She started to eat pies/desserts and sweet tea, A1C has gone up to 9.1 % She denies polyphagia, polydipsia or polyuria, she has noticed nocturia lately . Eye exam is up to date, we will urine micro today. She has dyslipidemia, she is on Simvastatin and denies side effects. We will try adding Glipizide 5 mg in am and needs to resume a healthier diet She has associated HTN and dyslipidemia  HTN: she has hypertension, compliant with medication, Denies chest pain, dizziness  palpitation or SOB. Continue medication   Dyslipidemia: she is on statin therapy, last LDL was 65, denies myalgia Discussed healthier diet also   Morbid Obesity: BMI above 35 with co-morbidities such as DM, HTN, dyslipidemia, explained importance of weight loss.Discussed increasing dose of Trulicity , also portion control, she lost 4 lbs since last visit   Chronic bronchitis: she has been smoking for 29 years, she has a daily cough, is wet but does not expectorate, occasional wheezing but no sob.  She states since started on Spiriva cough has improved. She does not want lung CT yet    Patient Active Problem List   Diagnosis Date Noted  . Tobacco abuse counseling 10/22/2017  . Morbid obesity ([place]) 10/22/2017  . Arthritis of left knee 09/18/2015  . Type 2 diabetes  mellitus with hyperglycemia, without long-term current use of insulin ([place]) 08/22/2014  . Hypertension associated with diabetes ([place]) 08/22/2014  . Hyperlipidemia associated with type 2 diabetes mellitus ([place]) 08/22/2014  . Obesity (BMI 35.0-39.9 without comorbidity) 08/22/2014    Past Surgical History:  Procedure Laterality Date  . CATARACT EXTRACTION Left 05/17/14   [facility] - [facility]  . CATARACT EXTRACTION W/PHACO Right 07/26/2014   Procedure: CATARACT EXTRACTION PHACO AND INTRAOCULAR LENS PLACEMENT (IOC);  Surgeon: Leandrew Koyanagi, MD;  Location: [facility];  Service: Ophthalmology;  Laterality: Right;  DIABETIC  . CESAREAN SECTION    . FOOT SURGERY  2014    Family History  Problem Relation Age of Onset  . Diabetes Father   . Heart disease Father   . Diabetes Sister   . Hyperlipidemia Sister   . Hypertension Sister   . Hypertension Brother   . Diabetes Brother   . Diabetes Sister   . Kidney disease Sister   . Heart attack Sister   . Diabetes Sister   . Diabetes Sister   . Diabetes Brother   . Stomach cancer Brother     Social History   Tobacco Use  . Smoking status: Current Every Day Smoker    Packs/day: 1.00    Years: 29.00    Pack years: 29.00    Types: Cigarettes  . Smokeless tobacco: Current User    Types: Snuff  Substance Use Topics  . Alcohol use: No    Alcohol/week: 0.0 standard drinks     Current Outpatient Medications:  .  aspirin 81 MG tablet, Take 81 mg by mouth daily. PM, Disp: , Rfl:  .  blood glucose meter kit and supplies KIT, Dispense based on patient and insurance preference. Check sugars once a day fasting. E11.69, Disp: 1 each, Rfl: 0 .  blood glucose meter kit and supplies, Dispense based on patient and insurance preference. Use up to four times daily as directed. (FOR ICD-10 E10.9, E11.9)., Disp: 1 each, Rfl: 2 .  Blood Glucose Monitoring Suppl (FREESTYLE LITE) DEVI, USE AS DIRECTED, Disp: 1 each, Rfl: 0 .  Cholecalciferol  (VITAMIN D) 2000 units CAPS, Take 1 capsule (2,000 Units total) by mouth daily., Disp: 90 capsule, Rfl: 1 .  FREESTYLE LITE test strip, CHECK SUGARS ONCE A DAY, Disp: 100 strip, Rfl: 2 .  Lancets (FREESTYLE) lancets, USE DAILY, Disp: 100 each, Rfl: 3 .  propranolol (INDERAL) 10 MG tablet, TAKE 1 TABLET BY MOUTH TWICE DAILY, Disp: 180 tablet, Rfl: 0 .  simvastatin (ZOCOR) 40 MG tablet, Take 1 tablet (40 mg total) by mouth at bedtime., Disp: 90 tablet, Rfl: 0 .  tiotropium (SPIRIVA HANDIHALER) 18 MCG inhalation capsule, Place 1 capsule (18 mcg total) into inhaler and inhale daily., Disp: 30 capsule, Rfl: 2 .  TRULICITY 3 LY/6.5KP SOPN, INJECT 0.5 MLS (3 MG TOTAL) AS DIRECTED ONCE A WEEK., Disp: 2 mL, Rfl: 2 .  valsartan-hydrochlorothiazide (DIOVAN-HCT) 160-25 MG tablet, TAKE 1 TABLET BY MOUTH DAILY. IN PLACE OF BUMEX AND BENAZEPRIL, Disp: 90 tablet, Rfl: 0 .  XIGDUO XR 11-998 MG TB24, TAKE 1 TABLET BY MOUTH DAILY. IN PLACE OF METFORMIN AND GLYBURIDE, Disp: 30 tablet, Rfl: 2  No Known Allergies  I personally reviewed active problem list, medication list, allergies, family history, social history, health maintenance with the patient/caregiver today.   ROS  Constitutional: Negative for fever or weight change.  Respiratory: positive  for cough but but no  shortness of breath.   Cardiovascular: Negative for chest pain or palpitations.  Gastrointestinal: Negative for abdominal pain, no bowel changes.  Musculoskeletal: Negative for gait problem or joint swelling.  Skin: Negative for rash.  Neurological: Negative for dizziness or headache.  No other specific complaints in a complete review of systems (except as listed in HPI above).  Objective  Vitals:   11/07/19 1134  BP: 102/62  Pulse: 92  Resp: 16  Temp: 98.2 F (36.8 C)  TempSrc: Oral  SpO2: 100%  Weight: 184 lb 3.2 oz (83.6 kg)  Height: _0  (1.499 m)    Body mass index is 37.2 kg/m.    Physical Exam  Constitutional:  Patient appears well-developed and well-nourished. Obese No distress.  HEENT: head atraumatic, normocephalic, pupils equal and reactive to light, neck supple Cardiovascular: Normal rate, regular rhythm and normal heart sounds.  No murmur heard. No BLE edema. Pulmonary/Chest: Effort normal and breath sounds normal. No respiratory distress. Abdominal: Soft.  There is no tenderness. Psychiatric: Patient has a normal mood and affect. behavior is normal. Judgment and thought content normal.  Recent Results (from the past 2160 hour(s))  POCT HgB A1C     Status: Abnormal   Collection Time: 11/07/19 11:40 AM  Result Value Ref Range   Hemoglobin A1C 9.1 (A) 4.0 - 5.6 %   HbA1c POC (<> result, manual entry)     HbA1c, POC (prediabetic range)     HbA1c, POC (  controlled diabetic range)      Diabetic Foot Exam: Diabetic Foot Exam - Simple   Simple Foot Form Diabetic Foot exam was performed with the following findings: Yes 11/07/2019 11:42 AM  Visual Inspection No deformities, no ulcerations, no other skin breakdown bilaterally: Yes Sensation Testing Intact to touch and monofilament testing bilaterally: Yes Pulse Check Posterior Tibialis and Dorsalis pulse intact bilaterally: Yes Comments     PHQ2/9: Depression screen [facility] 2/9 11/07/2019 07/18/2019 11/11/2018 09/24/2018 05/24/2018  Decreased Interest 0 0 3 0 0  Down, Depressed, Hopeless 0 0 0 0 0  PHQ - 2 Score 0 0 3 0 0  Altered sleeping - 0 0 0 0  Tired, decreased energy - 0 0 0 0  Change in appetite - 0 0 0 0  Feeling bad or failure about yourself  - 0 0 0 0  Trouble concentrating - 0 0 0 0  Moving slowly or fidgety/restless - 0 0 0 0  Suicidal thoughts - 0 0 0 0  PHQ-9 Score - 0 3 0 0  Difficult doing work/chores - - Not difficult at all Not difficult at all Not difficult at all  Some recent data might be hidden    phq 9 is negative   Fall Risk: Fall Risk  11/07/2019 07/18/2019 02/17/2019 09/24/2018 05/24/2018  Falls in the past year? 0 0  0 0 0  Number falls in past yr: 0 - 0 0 0  Injury with Fall? 0 - 0 0 0  Follow up - - Falls evaluation completed Falls evaluation completed Falls evaluation completed     Functional Status Survey: Is the patient deaf or have difficulty hearing?: No Does the patient have difficulty seeing, even when wearing glasses/contacts?: No Does the patient have difficulty concentrating, remembering, or making decisions?: No Does the patient have difficulty walking or climbing stairs?: No Does the patient have difficulty dressing or bathing?: No Does the patient have difficulty doing errands alone such as visiting a doctor's office or shopping?: No    Assessment & Plan  1. Diabetes mellitus type 2 in obese (HCC)  - POCT HgB A1C - HM Diabetes Foot Exam - glipiZIDE (GLUCOTROL XL) 5 MG 24 hr tablet; Take 1 tablet (5 mg total) by mouth daily with breakfast.  Dispense: 90 tablet; Refill: 0  2. Need for vaccination with 13-polyvalent pneumococcal conjugate vaccine  - Pneumococcal conjugate vaccine 13-valent IM  3. Need for Tdap vaccination  - Tdap vaccine greater than or equal to 7yo IM  4. Hyperlipidemia associated with type 2 diabetes mellitus ([place])   5. Hypertension associated with diabetes ([place])   6. Morbid obesity ([place])  BMI above 35 with DM, HTN, hyperlipidemia  Discussed with the patient the risk posed by an increased BMI. Discussed importance of portion control, calorie counting and at least 150 minutes of physical activity weekly. Avoid sweet beverages and drink more water. Eat at least 6 servings of fruit and vegetables daily   7. Tobacco abuse counseling  Needs to quit smoking

## 2019-11-07 ENCOUNTER — Other Ambulatory Visit: Payer: Self-pay

## 2019-11-07 ENCOUNTER — Ambulatory Visit (INDEPENDENT_AMBULATORY_CARE_PROVIDER_SITE_OTHER): Payer: 59 | Admitting: Family Medicine

## 2019-11-07 ENCOUNTER — Encounter: Payer: Self-pay | Admitting: Family Medicine

## 2019-11-07 VITALS — BP 102/62 | HR 92 | Temp 98.2°F | Resp 16 | Ht 59.0 in | Wt 184.2 lb

## 2019-11-07 DIAGNOSIS — I152 Hypertension secondary to endocrine disorders: Secondary | ICD-10-CM

## 2019-11-07 DIAGNOSIS — E785 Hyperlipidemia, unspecified: Secondary | ICD-10-CM

## 2019-11-07 DIAGNOSIS — E1169 Type 2 diabetes mellitus with other specified complication: Secondary | ICD-10-CM | POA: Diagnosis not present

## 2019-11-07 DIAGNOSIS — Z716 Tobacco abuse counseling: Secondary | ICD-10-CM

## 2019-11-07 DIAGNOSIS — Z23 Encounter for immunization: Secondary | ICD-10-CM

## 2019-11-07 DIAGNOSIS — E1159 Type 2 diabetes mellitus with other circulatory complications: Secondary | ICD-10-CM | POA: Diagnosis not present

## 2019-11-07 DIAGNOSIS — E669 Obesity, unspecified: Secondary | ICD-10-CM

## 2019-11-07 DIAGNOSIS — I10 Essential (primary) hypertension: Secondary | ICD-10-CM | POA: Diagnosis not present

## 2019-11-07 LAB — POCT GLYCOSYLATED HEMOGLOBIN (HGB A1C): Hemoglobin A1C: 9.1 % — AB (ref 4.0–5.6)

## 2019-11-07 MED ORDER — GLIPIZIDE ER 5 MG PO TB24: 5.0000 mg | ORAL_TABLET | Freq: Every day | ORAL | 0 refills | Status: DC

## 2019-11-10 ENCOUNTER — Other Ambulatory Visit: Payer: Self-pay | Admitting: Family Medicine

## 2019-11-10 DIAGNOSIS — E785 Hyperlipidemia, unspecified: Secondary | ICD-10-CM

## 2020-01-11 ENCOUNTER — Other Ambulatory Visit: Payer: Self-pay | Admitting: Family Medicine

## 2020-01-18 ENCOUNTER — Other Ambulatory Visit: Payer: Self-pay | Admitting: Family Medicine

## 2020-02-13 ENCOUNTER — Other Ambulatory Visit: Payer: Self-pay | Admitting: Family Medicine

## 2020-02-13 DIAGNOSIS — E1169 Type 2 diabetes mellitus with other specified complication: Secondary | ICD-10-CM

## 2020-02-15 ENCOUNTER — Ambulatory Visit (INDEPENDENT_AMBULATORY_CARE_PROVIDER_SITE_OTHER): Payer: 59 | Admitting: Family Medicine

## 2020-02-15 ENCOUNTER — Other Ambulatory Visit: Payer: Self-pay | Admitting: Family Medicine

## 2020-02-15 ENCOUNTER — Other Ambulatory Visit: Payer: Self-pay

## 2020-02-15 ENCOUNTER — Encounter: Payer: Self-pay | Admitting: Family Medicine

## 2020-02-15 VITALS — BP 124/82 | HR 96 | Temp 97.9°F | Resp 16 | Ht 59.0 in | Wt 172.3 lb

## 2020-02-15 DIAGNOSIS — J410 Simple chronic bronchitis: Secondary | ICD-10-CM

## 2020-02-15 DIAGNOSIS — I10 Essential (primary) hypertension: Secondary | ICD-10-CM | POA: Diagnosis not present

## 2020-02-15 DIAGNOSIS — E1159 Type 2 diabetes mellitus with other circulatory complications: Secondary | ICD-10-CM | POA: Diagnosis not present

## 2020-02-15 DIAGNOSIS — E785 Hyperlipidemia, unspecified: Secondary | ICD-10-CM

## 2020-02-15 DIAGNOSIS — E1169 Type 2 diabetes mellitus with other specified complication: Secondary | ICD-10-CM | POA: Diagnosis not present

## 2020-02-15 DIAGNOSIS — E559 Vitamin D deficiency, unspecified: Secondary | ICD-10-CM

## 2020-02-15 DIAGNOSIS — Z716 Tobacco abuse counseling: Secondary | ICD-10-CM | POA: Diagnosis not present

## 2020-02-15 DIAGNOSIS — E669 Obesity, unspecified: Secondary | ICD-10-CM | POA: Diagnosis not present

## 2020-02-15 DIAGNOSIS — Z1231 Encounter for screening mammogram for malignant neoplasm of breast: Secondary | ICD-10-CM

## 2020-02-15 DIAGNOSIS — Z79899 Other long term (current) drug therapy: Secondary | ICD-10-CM

## 2020-02-15 DIAGNOSIS — E119 Type 2 diabetes mellitus without complications: Secondary | ICD-10-CM

## 2020-02-15 DIAGNOSIS — I152 Hypertension secondary to endocrine disorders: Secondary | ICD-10-CM

## 2020-02-15 LAB — POCT GLYCOSYLATED HEMOGLOBIN (HGB A1C): Hemoglobin A1C: 9.2 % — AB (ref 4.0–5.6)

## 2020-02-15 MED ORDER — PROPRANOLOL HCL 10 MG PO TABS: 10.0000 mg | ORAL_TABLET | Freq: Two times a day (BID) | ORAL | 0 refills | Status: DC

## 2020-02-15 MED ORDER — SIMVASTATIN 40 MG PO TABS: 40.0000 mg | ORAL_TABLET | Freq: Every day | ORAL | 1 refills | Status: DC

## 2020-02-15 MED ORDER — SPIRIVA HANDIHALER 18 MCG IN CAPS: 18.0000 ug | ORAL_CAPSULE | Freq: Every day | RESPIRATORY_TRACT | 5 refills | Status: DC

## 2020-02-15 MED ORDER — TRULICITY 3 MG/0.5ML ~~LOC~~ SOAJ: SUBCUTANEOUS | 2 refills | Status: DC

## 2020-02-15 MED ORDER — XIGDUO XR 10-1000 MG PO TB24
1.0000 | ORAL_TABLET | Freq: Every day | ORAL | 0 refills | Status: DC
Start: 2020-02-15 — End: 2020-02-15

## 2020-02-15 MED ORDER — GLIPIZIDE ER 10 MG PO TB24: 10.0000 mg | ORAL_TABLET | Freq: Every day | ORAL | 0 refills | Status: DC

## 2020-02-15 MED ORDER — PIOGLITAZONE HCL 15 MG PO TABS: 15.0000 mg | ORAL_TABLET | Freq: Every day | ORAL | 0 refills | Status: DC

## 2020-02-15 MED ORDER — VALSARTAN-HYDROCHLOROTHIAZIDE 160-25 MG PO TABS
1.0000 | ORAL_TABLET | Freq: Every day | ORAL | 0 refills | Status: DC
Start: 2020-02-15 — End: 2020-04-16

## 2020-02-15 NOTE — Progress Notes (Signed)
Name: Tracey Tucker   MRN: [id number]    DOB: 05/06/1955   Date:02/15/2020       Progress Note  Subjective  Chief Complaint  Chief Complaint  Patient presents with  . Diabetes  . Hyperlipidemia  . Osteoarthritis    4 month follow up    HPI  DMII: She was doing well through 2020 and early 2021, but since September her A1C has been above 9 %. She states she has been trying very hard to eat healthy and lost weight since last visit. She is on Trulicity 3 mg, Xigduo XR and Glipizide 5 mg. She states fasting level around 130/160's. She states under more stress lately - brother died of stomach cancer and got buried on Monday. We will resume Pioglitazone and refer to Endo. She denies polyphagia, polydipsia and polyuria. She has dyslipidemia , HTN and obesity on statin therapy and ACE  HTN: she has hypertension, compliant with medication, Denies chest pain,dizzinesspalpitation or SOB. BP is at goal   Dyslipidemia: she is on statin therapy, last LDL was 65, denies myalgia Discussed healthier diet also We will recheck labs today    Obesity: BMI is now below 35, she has been cutting on sweets and has lost weight since last visit. She was disappointed that her sugar is not better   Chronic bronchitis: she has been smoking for 30 years, she states cough no longer daily, she has been trying to quit smoking but still dip snuff,she states no recent episode of wheezing or sob.  She states since started on Spiriva cough has improved. She does not want lung CT   Patient Active Problem List   Diagnosis Date Noted  . Tobacco abuse counseling 10/22/2017  . Morbid obesity (HCC) 10/22/2017  . Arthritis of left knee 09/18/2015  . Type 2 diabetes mellitus with hyperglycemia, without long-term current use of insulin (HCC) 08/22/2014  . Hypertension associated with diabetes (HCC) 08/22/2014  . Hyperlipidemia associated with type 2 diabetes mellitus (HCC) 08/22/2014  . Obesity (BMI 35.0-39.9 without  comorbidity) 08/22/2014    Past Surgical History:  Procedure Laterality Date  . CATARACT EXTRACTION Left 05/17/14   [facility] [place]  . CATARACT EXTRACTION W/PHACO Right 07/26/2014   Procedure: CATARACT EXTRACTION PHACO AND INTRAOCULAR LENS PLACEMENT (IOC);  Surgeon: Lockie Mola, MD;  Location: [facility];  Service: Ophthalmology;  Laterality: Right;  DIABETIC  . CESAREAN SECTION    . FOOT SURGERY  2014    Family History  Problem Relation Age of Onset  . Diabetes Father   . Heart disease Father   . Diabetes Sister   . Hyperlipidemia Sister   . Hypertension Sister   . Hypertension Brother   . Diabetes Brother   . Diabetes Sister   . Kidney disease Sister   . Heart attack Sister   . Diabetes Sister   . Diabetes Sister   . Diabetes Brother   . Stomach cancer Brother   . Stroke Brother     Social History   Tobacco Use  . Smoking status: Current Every Day Smoker    Packs/day: 1.00    Years: 29.00    Pack years: 29.00    Types: Cigarettes  . Smokeless tobacco: Current User    Types: Snuff  Substance Use Topics  . Alcohol use: No    Alcohol/week: 0.0 standard drinks     Current Outpatient Medications:  .  aspirin 81 MG tablet, Take 81 mg by mouth daily. PM, Disp: , Rfl:  .  Blood Glucose Monitoring Suppl (FREESTYLE LITE) DEVI, USE AS DIRECTED, Disp: 1 each, Rfl: 0 .  Cholecalciferol (VITAMIN D) 2000 units CAPS, Take 1 capsule (2,000 Units total) by mouth daily., Disp: 90 capsule, Rfl: 1 .  FREESTYLE LITE test strip, CHECK SUGARS ONCE A DAY, Disp: 100 strip, Rfl: 2 .  glucose blood test strip, , Disp: , Rfl:  .  Lancets (FREESTYLE) lancets, USE DAILY, Disp: 100 each, Rfl: 3 .  pioglitazone (ACTOS) 15 MG tablet, Take 1 tablet (15 mg total) by mouth daily., Disp: 90 tablet, Rfl: 0 .  Dapagliflozin-metFORMIN HCl ER (XIGDUO XR) 11-998 MG TB24, Take 1 tablet by mouth daily. In place of metformin and glyburide, Disp: 90 tablet, Rfl: 0 .  Dulaglutide  (TRULICITY) 3 ZO/1.0RU SOPN, INJECT 3 MG UNDER THE SKIN ONCE A WEEK AS DIRECTED, Disp: 2 mL, Rfl: 2 .  glipiZIDE (GLUCOTROL XL) 10 MG 24 hr tablet, Take 1 tablet (10 mg total) by mouth daily with breakfast., Disp: 90 tablet, Rfl: 0 .  propranolol (INDERAL) 10 MG tablet, Take 1 tablet (10 mg total) by mouth 2 (two) times daily., Disp: 180 tablet, Rfl: 0 .  simvastatin (ZOCOR) 40 MG tablet, Take 1 tablet (40 mg total) by mouth at bedtime., Disp: 90 tablet, Rfl: 1 .  tiotropium (SPIRIVA HANDIHALER) 18 MCG inhalation capsule, Place 1 capsule (18 mcg total) into inhaler and inhale daily., Disp: 30 capsule, Rfl: 5 .  valsartan-hydrochlorothiazide (DIOVAN-HCT) 160-25 MG tablet, Take 1 tablet by mouth daily. In place of Bumex and benazepril, Disp: 90 tablet, Rfl: 0  No Known Allergies  I personally reviewed active problem list, medication list, allergies, family history, social history, health maintenance, notes from last encounter with the patient/caregiver today.   ROS  Constitutional: Negative for fever , positive for  weight change.  Respiratory: positive  For intermittent  cough and shortness of breath.   Cardiovascular: Negative for chest pain or palpitations.  Gastrointestinal: Negative for abdominal pain, no bowel changes.  Musculoskeletal: Negative for gait problem or joint swelling.  Skin: Negative for rash.  Neurological: Negative for dizziness or headache.  No other specific complaints in a complete review of systems (except as listed in HPI above).  Objective  Vitals:   02/15/20 1033  BP: 124/82  Pulse: 96  Resp: 16  Temp: 97.9 F (36.6 C)  TempSrc: Oral  SpO2: 98%  Weight: 172 lb 4.8 oz (78.2 kg)  Height: 4\' 11"  (1.499 m)    Body mass index is 34.8 kg/m.  Physical Exam  Constitutional: Patient appears well-developed and well-nourished. Obese  No distress.  HEENT: head atraumatic, normocephalic, pupils equal and reactive to light, neck supple Cardiovascular: Normal  rate, regular rhythm and normal heart sounds.  No murmur heard. No BLE edema. Pulmonary/Chest: Effort normal and breath sounds normal. No respiratory distress. Abdominal: Soft.  There is no tenderness. Psychiatric: Patient has a normal mood and affect. behavior is normal. Judgment and thought content normal.  Recent Results (from the past 2160 hour(s))  POCT HgB A1C     Status: Abnormal   Collection Time: 02/15/20 11:03 AM  Result Value Ref Range   Hemoglobin A1C 9.2 (A) 4.0 - 5.6 %   HbA1c POC (<> result, manual entry)     HbA1c, POC (prediabetic range)     HbA1c, POC (controlled diabetic range)        PHQ2/9: Depression screen [facility] 2/9 02/15/2020 11/07/2019 07/18/2019 11/11/2018 09/24/2018  Decreased Interest 0 0 0 3 0  Down,  Depressed, Hopeless 0 0 0 0 0  PHQ - 2 Score 0 0 0 3 0  Altered sleeping - - 0 0 0  Tired, decreased energy - - 0 0 0  Change in appetite - - 0 0 0  Feeling bad or failure about yourself  - - 0 0 0  Trouble concentrating - - 0 0 0  Moving slowly or fidgety/restless - - 0 0 0  Suicidal thoughts - - 0 0 0  PHQ-9 Score - - 0 3 0  Difficult doing work/chores - - - Not difficult at all Not difficult at all  Some recent data might be hidden    phq 9 is negative   Fall Risk: Fall Risk  02/15/2020 11/07/2019 07/18/2019 02/17/2019 09/24/2018  Falls in the past year? 0 0 0 0 0  Number falls in past yr: 0 0 - 0 0  Injury with Fall? 0 0 - 0 0  Follow up Falls evaluation completed - - Falls evaluation completed Falls evaluation completed    Functional Status Survey: Is the patient deaf or have difficulty hearing?: No Does the patient have difficulty seeing, even when wearing glasses/contacts?: No Does the patient have difficulty concentrating, remembering, or making decisions?: No Does the patient have difficulty walking or climbing stairs?: No Does the patient have difficulty dressing or bathing?: No Does the patient have difficulty doing errands alone such as visiting a  doctor's office or shopping?: No    Assessment & Plan  1. Diabetes mellitus type 2 in obese (HCC)  - POCT HgB A1C - glipiZIDE (GLUCOTROL XL) 10 MG 24 hr tablet; Take 1 tablet (10 mg total) by mouth daily with breakfast.  Dispense: 90 tablet; Refill: 0 - Dulaglutide (TRULICITY) 3 MG/0.5ML SOPN; INJECT 3 MG UNDER THE SKIN ONCE A WEEK AS DIRECTED  Dispense: 2 mL; Refill: 2 - Dapagliflozin-metFORMIN HCl ER (XIGDUO XR) 11-998 MG TB24; Take 1 tablet by mouth daily. In place of metformin and glyburide  Dispense: 90 tablet; Refill: 0 - Ambulatory referral to Endocrinology - Lipid panel  2. Encounter for screening mammogram for malignant neoplasm of breast  - MM Digital Screening; Future  3. Essential hypertension  - propranolol (INDERAL) 10 MG tablet; Take 1 tablet (10 mg total) by mouth 2 (two) times daily.  Dispense: 180 tablet; Refill: 0 - valsartan-hydrochlorothiazide (DIOVAN-HCT) 160-25 MG tablet; Take 1 tablet by mouth daily. In place of Bumex and benazepril  Dispense: 90 tablet; Refill: 0 - COMPLETE METABOLIC PANEL WITH GFR - CBC with Differential/Platelet  4. Dyslipidemia  - simvastatin (ZOCOR) 40 MG tablet; Take 1 tablet (40 mg total) by mouth at bedtime.  Dispense: 90 tablet; Refill: 1  5. Simple chronic bronchitis (HCC)  - tiotropium (SPIRIVA HANDIHALER) 18 MCG inhalation capsule; Place 1 capsule (18 mcg total) into inhaler and inhale daily.  Dispense: 30 capsule; Refill: 5  6. Vitamin D deficiency  - VITAMIN D 25 Hydroxy (Vit-D Deficiency, Fractures)  7. Tobacco abuse counseling   8. Hyperlipidemia associated with type 2 diabetes mellitus (HCC)   9. Hypertension associated with diabetes (HCC)  - Microalbumin / creatinine urine ratio  10. Dyslipidemia associated with type 2 diabetes mellitus (HCC)   11. Long-term use of high-risk medication  - Vitamin B12

## 2020-02-16 LAB — COMPLETE METABOLIC PANEL WITH GFR
AG Ratio: 1.6 (calc) (ref 1.0–2.5)
ALT: 14 U/L (ref 6–29)
AST: 16 U/L (ref 10–35)
Albumin: 4.6 g/dL (ref 3.6–5.1)
Alkaline phosphatase (APISO): 56 U/L (ref 37–153)
BUN/Creatinine Ratio: 22 (calc) (ref 6–22)
BUN: 23 mg/dL (ref 7–25)
CO2: 29 mmol/L (ref 20–32)
Calcium: 10.1 mg/dL (ref 8.6–10.4)
Chloride: 96 mmol/L — ABNORMAL LOW (ref 98–110)
Creat: 1.06 mg/dL — ABNORMAL HIGH (ref 0.50–0.99)
GFR, Est African American: 64 mL/min/{1.73_m2} (ref >=60)
GFR, Est Non African American: 55 mL/min/{1.73_m2} — ABNORMAL LOW (ref >=60)
Globulin: 2.8 g/dL (calc) (ref 1.9–3.7)
Glucose, Bld: 152 mg/dL — ABNORMAL HIGH (ref 65–99)
Potassium: 4.1 mmol/L (ref 3.5–5.3)
Sodium: 134 mmol/L — ABNORMAL LOW (ref 135–146)
Total Bilirubin: 0.3 mg/dL (ref 0.2–1.2)
Total Protein: 7.4 g/dL (ref 6.1–8.1)

## 2020-02-16 LAB — MICROALBUMIN / CREATININE URINE RATIO
Creatinine, Urine: 82 mg/dL (ref 20–275)
Microalb Creat Ratio: 2 mcg/mg creat (ref <30)
Microalb, Ur: 0.2 mg/dL

## 2020-02-16 LAB — CBC WITH DIFFERENTIAL/PLATELET
Absolute Monocytes: 714 cells/uL (ref 200–950)
Basophils Absolute: 42 cells/uL (ref 0–200)
Basophils Relative: 0.7 %
Eosinophils Absolute: 30 cells/uL (ref 15–500)
Eosinophils Relative: 0.5 %
HCT: 45.8 % — ABNORMAL HIGH (ref 35.0–45.0)
Hemoglobin: 15.1 g/dL (ref 11.7–15.5)
Lymphs Abs: 1890 cells/uL (ref 850–3900)
MCH: 27.9 pg (ref 27.0–33.0)
MCHC: 33 g/dL (ref 32.0–36.0)
MCV: 84.7 fL (ref 80.0–100.0)
MPV: 11.1 fL (ref 7.5–12.5)
Monocytes Relative: 11.9 %
Neutro Abs: 3324 cells/uL (ref 1500–7800)
Neutrophils Relative %: 55.4 %
Platelets: 255 10*3/uL (ref 140–400)
RBC: 5.41 10*6/uL — ABNORMAL HIGH (ref 3.80–5.10)
RDW: 12.8 % (ref 11.0–15.0)
Total Lymphocyte: 31.5 %
WBC: 6 10*3/uL (ref 3.8–10.8)

## 2020-02-16 LAB — LIPID PANEL
Cholesterol: 157 mg/dL (ref <200)
HDL: 43 mg/dL — ABNORMAL LOW (ref >=50)
LDL Cholesterol (Calc): 92 mg/dL (calc)
Non-HDL Cholesterol (Calc): 114 mg/dL (calc) (ref <130)
Total CHOL/HDL Ratio: 3.7 (calc) (ref <5.0)
Triglycerides: 122 mg/dL (ref <150)

## 2020-02-16 LAB — VITAMIN D 25 HYDROXY (VIT D DEFICIENCY, FRACTURES): Vit D, 25-Hydroxy: 31 ng/mL (ref 30–100)

## 2020-02-16 LAB — VITAMIN B12: Vitamin B-12: 448 pg/mL (ref 200–1100)

## 2020-04-03 ENCOUNTER — Ambulatory Visit
Admission: RE | Admit: 2020-04-03 | Discharge: 2020-04-03 | Disposition: A | Discharge status: Home / Self Care | Payer: 59 | Source: Ambulatory Visit | Attending: Family Medicine | Admitting: Family Medicine

## 2020-04-03 ENCOUNTER — Other Ambulatory Visit: Payer: Self-pay

## 2020-04-03 DIAGNOSIS — Z1231 Encounter for screening mammogram for malignant neoplasm of breast: Secondary | ICD-10-CM | POA: Insufficient documentation

## 2020-04-16 ENCOUNTER — Other Ambulatory Visit: Payer: Self-pay | Admitting: Surgery

## 2020-04-16 ENCOUNTER — Other Ambulatory Visit: Payer: Self-pay | Admitting: Family Medicine

## 2020-04-16 DIAGNOSIS — Z720 Tobacco use: Secondary | ICD-10-CM | POA: Diagnosis not present

## 2020-04-16 DIAGNOSIS — E1165 Type 2 diabetes mellitus with hyperglycemia: Secondary | ICD-10-CM | POA: Diagnosis not present

## 2020-04-16 DIAGNOSIS — I10 Essential (primary) hypertension: Secondary | ICD-10-CM

## 2020-04-16 DIAGNOSIS — E782 Mixed hyperlipidemia: Secondary | ICD-10-CM | POA: Diagnosis not present

## 2020-05-14 ENCOUNTER — Other Ambulatory Visit: Payer: Self-pay | Admitting: Family Medicine

## 2020-05-14 ENCOUNTER — Other Ambulatory Visit: Payer: Self-pay

## 2020-05-14 DIAGNOSIS — E669 Obesity, unspecified: Secondary | ICD-10-CM

## 2020-05-14 DIAGNOSIS — E1169 Type 2 diabetes mellitus with other specified complication: Secondary | ICD-10-CM

## 2020-05-14 MED ORDER — GLIPIZIDE ER 10 MG PO TB24
ORAL_TABLET | Freq: Every day | ORAL | 0 refills | Status: DC
  Filled 2020-05-14: qty 90, 90d supply, fill #0

## 2020-05-14 MED ORDER — PIOGLITAZONE HCL 15 MG PO TABS
ORAL_TABLET | Freq: Every day | ORAL | 0 refills | Status: DC
  Filled 2020-05-14: qty 90, 90d supply, fill #0

## 2020-05-14 MED FILL — Tiotropium Bromide Monohydrate Inhal Cap 18 MCG (Base Equiv): RESPIRATORY_TRACT | 30 days supply | Qty: 30 | Fill #0 | Status: AC

## 2020-05-15 ENCOUNTER — Other Ambulatory Visit: Payer: Self-pay

## 2020-05-18 MED FILL — Simvastatin Tab 40 MG: ORAL | 90 days supply | Qty: 90 | Fill #0 | Status: AC

## 2020-05-19 ENCOUNTER — Other Ambulatory Visit: Payer: Self-pay

## 2020-05-22 ENCOUNTER — Other Ambulatory Visit: Payer: Self-pay | Admitting: Family Medicine

## 2020-05-22 ENCOUNTER — Other Ambulatory Visit: Payer: Self-pay

## 2020-05-22 DIAGNOSIS — E669 Obesity, unspecified: Secondary | ICD-10-CM

## 2020-05-22 DIAGNOSIS — E1169 Type 2 diabetes mellitus with other specified complication: Secondary | ICD-10-CM

## 2020-05-22 MED ORDER — XIGDUO XR 10-1000 MG PO TB24
ORAL_TABLET | ORAL | 0 refills | Status: DC
  Filled 2020-05-22: qty 30, 30d supply, fill #0
  Filled 2020-05-23: qty 60, 60d supply, fill #0
  Filled 2020-05-24: qty 90, 90d supply, fill #0

## 2020-05-22 NOTE — Telephone Encounter (Signed)
Pt called and is requesting to let PCP know that she will be out of this medication tomorrow. She states that she would like to have this sent in today if possible. Pt was advised that this may still take up to 3 business days. Please advise.

## 2020-05-22 NOTE — Telephone Encounter (Signed)
Notes to clinic: Review for change of medication requested by pharmacy    Requested Prescriptions  Pending Prescriptions Disp Refills   Dapagliflozin-metFORMIN HCl ER (XIGDUO XR) 11-998 MG TB24 90 tablet 0    Sig: TAKE 1 TABLET BY MOUTH DAILY. IN PLACE OF METFORMIN AND GLYBURIDE      Endocrinology:  Diabetes - Biguanide + SGLT2 Inhibitor Combos Failed - 05/22/2020  9:04 AM      Failed - Cr in normal range and within 360 days    Creat  Date Value Ref Range Status  02/15/2020 1.06 (H) 0.50 - 0.99 mg/dL Final    Comment:    For patients >11 years of age, the reference limit for Creatinine is approximately 13% higher for people identified as African-American. .    Creatinine, Urine  Date Value Ref Range Status  02/15/2020 82 20 - 275 mg/dL Final          Failed - HBA1C is between 0 and 7.9 and within 180 days    Hemoglobin A1C  Date Value Ref Range Status  02/15/2020 9.2 (A) 4.0 - 5.6 % Final   Hgb A1c MFr Bld  Date Value Ref Range Status  10/27/2018 6.6 (H) <5.7 % of total Hgb Final    Comment:    For someone without known diabetes, a hemoglobin A1c value of 6.5% or greater indicates that they may have  diabetes and this should be confirmed with a follow-up  test. . For someone with known diabetes, a value <7% indicates  that their diabetes is well controlled and a value  greater than or equal to 7% indicates suboptimal  control. A1c targets should be individualized based on  duration of diabetes, age, comorbid conditions, and  other considerations. . Currently, no consensus exists regarding use of hemoglobin A1c for diagnosis of diabetes for children. .           Passed - LDL in normal range and within 360 days    Ldl Cholesterol, Calc  Date Value Ref Range Status  10/22/2012 69 0 - 100 mg/dL Final   LDL Cholesterol (Calc)  Date Value Ref Range Status  02/15/2020 92 mg/dL (calc) Final    Comment:    Reference range: <100 . Desirable range <100 mg/dL  for primary prevention;   <70 mg/dL for patients with CHD or diabetic patients  with > or = 2 CHD risk factors. Marland Kitchen LDL-C is now calculated using the Martin-Hopkins  calculation, which is a validated novel method providing  better accuracy than the Friedewald equation in the  estimation of LDL-C.  Cresenciano Genre et al. Annamaria Helling. 8756;433(29): 2061-2068  (http://education.[employer].com/faq/FAQ164)           Passed - AA eGFR in normal range and within 360 days    GFR, Est African American  Date Value Ref Range Status  02/15/2020 64 > OR = 60 mL/min/1.40m Final   GFR, Est Non African American  Date Value Ref Range Status  02/15/2020 55 (L) > OR = 60 mL/min/1.757mFinal          Passed - Valid encounter within last 6 months    Recent Outpatient Visits           3 months ago [facility]   6 months ago [facility]   10 months ago Dyslipidemia associated  with type 2 diabetes mellitus [facility])   [facility] Steele Sizer, MD   1 year ago Essential hypertension   [place], [place], [place]   1 year ago Well woman exam (no gynecological exam)   [place], [place]       Future Appointments             In 1 month Steele Sizer, MD [facility], [facility]

## 2020-05-23 ENCOUNTER — Other Ambulatory Visit: Payer: Self-pay

## 2020-05-24 ENCOUNTER — Other Ambulatory Visit: Payer: Self-pay

## 2020-05-28 MED FILL — Dulaglutide Soln Auto-injector 4.5 MG/0.5ML: SUBCUTANEOUS | 28 days supply | Qty: 2 | Fill #0 | Status: AC

## 2020-05-29 ENCOUNTER — Other Ambulatory Visit: Payer: Self-pay

## 2020-05-30 ENCOUNTER — Other Ambulatory Visit: Payer: Self-pay

## 2020-06-05 ENCOUNTER — Ambulatory Visit: Payer: Self-pay | Admitting: *Deleted

## 2020-06-05 NOTE — Telephone Encounter (Signed)
Pt called in c/o pain in her left heel that started last Thursday while she was working.   Her job requires she walk all day long.   "I can't put any pressure on it or walk it hurts so bad".   There is a little swelling in the heel and ankle but not the whole foot once I asked her more specific questions.  It's not the whole foot that is swollen and hurting.    No injuries or open wounds or accidents to the foot.   Over the weekend when she was off of work it got better.  She denies a difference in the color or temperature of the left foot compared to the right.  Yesterday when she lifted something heavy and put weight on her heel it really hurt and has been hurting since.   Last night it hurt all night long.  She has not tried any pain medication or ice.  I went over using an ice pack for the pain and swelling and taking Tylenol for pain if she can take Tylenol.   She said she had an ice pack but no Tylenol.   She would try the ice pack.  There are no appts at [facility] so the agent sent a CRM requesting she be put on the waiting list in case of a cancellation.    Protocol is to be seen within 4 hours so I have recommended she go to the urgent care.   She is agreeable and is going to the [employer] at [facility].  I sent my notes to [facility] for their information.   Reason for Disposition . [1] SEVERE pain (e.g., excruciating, unable to do any normal activities) AND [2] not improved after 2 hours of pain medicine    Going to the [facility] in clinic  Answer Assessment - Initial Assessment Questions 1. ONSET: "When did the pain start?"      Having pain and swelling in left foot since last Thursday.   No injuries or wounds.   2. LOCATION: "Where is the pain located?"      It's the bottom of my foot on the side.   I can't put pressure on my foot.   It's my heel hurts.  It hurt all night. 3. PAIN: "How bad is the pain?"    (Scale 1-10; or  mild, moderate, severe)  - MILD (1-3): doesn't interfere with normal activities.   - MODERATE (4-7): interferes with normal activities (e.g., work or school) or awakens from sleep, limping.   - SEVERE (8-10): excruciating pain, unable to do any normal activities, unable to walk.      10   She had foot surgery 10 years ago.    4. WORK OR EXERCISE: "Has there been any recent work or exercise that involved this part of the body?"      No 5. CAUSE: "What do you think is causing the foot pain?"     I don't know.  Last Thur. I was at work and it started.   Yesterday I lifted something heavy and the pain started up.   It was better over the weekend.   My job is walking all day.   No plantar faciitis or heel spurs she is aware.   Not sure what the surgery was for on her foot over 10 yrs ago 6. OTHER SYMPTOMS: "Do you have any other symptoms?" (e.g., leg pain, rash, fever, numbness)  Color and temperature is same as right.   My ankle looks a little swollen. 7. PREGNANCY: "Is there any chance you are pregnant?" "When was your last menstrual period?"     N/A  Protocols used: FOOT PAIN-A-AH

## 2020-06-06 ENCOUNTER — Ambulatory Visit
Admission: RE | Admit: 2020-06-06 | Discharge: 2020-06-06 | Disposition: A | Discharge status: Home / Self Care | Payer: 59 | Source: Ambulatory Visit | Attending: Family Medicine | Admitting: Family Medicine

## 2020-06-06 ENCOUNTER — Ambulatory Visit
Admission: RE | Admit: 2020-06-06 | Discharge: 2020-06-06 | Disposition: A | Discharge status: Home / Self Care | Payer: 59 | Attending: Family Medicine | Admitting: Family Medicine

## 2020-06-06 ENCOUNTER — Ambulatory Visit (INDEPENDENT_AMBULATORY_CARE_PROVIDER_SITE_OTHER): Payer: 59 | Admitting: Family Medicine

## 2020-06-06 ENCOUNTER — Other Ambulatory Visit: Payer: Self-pay

## 2020-06-06 ENCOUNTER — Encounter: Payer: Self-pay | Admitting: Family Medicine

## 2020-06-06 VITALS — BP 98/68 | HR 73 | Temp 97.8°F | Resp 16 | Ht 59.0 in | Wt 174.1 lb

## 2020-06-06 DIAGNOSIS — I152 Hypertension secondary to endocrine disorders: Secondary | ICD-10-CM | POA: Diagnosis not present

## 2020-06-06 DIAGNOSIS — M7989 Other specified soft tissue disorders: Secondary | ICD-10-CM

## 2020-06-06 DIAGNOSIS — E1159 Type 2 diabetes mellitus with other circulatory complications: Secondary | ICD-10-CM

## 2020-06-06 DIAGNOSIS — M8580 Other specified disorders of bone density and structure, unspecified site: Secondary | ICD-10-CM

## 2020-06-06 DIAGNOSIS — E1165 Type 2 diabetes mellitus with hyperglycemia: Secondary | ICD-10-CM

## 2020-06-06 DIAGNOSIS — Z8739 Personal history of other diseases of the musculoskeletal system and connective tissue: Secondary | ICD-10-CM

## 2020-06-06 DIAGNOSIS — M439 Deforming dorsopathy, unspecified: Secondary | ICD-10-CM | POA: Diagnosis not present

## 2020-06-06 DIAGNOSIS — Z9889 Other specified postprocedural states: Secondary | ICD-10-CM | POA: Diagnosis not present

## 2020-06-06 DIAGNOSIS — M19072 Primary osteoarthritis, left ankle and foot: Secondary | ICD-10-CM | POA: Diagnosis not present

## 2020-06-06 NOTE — Progress Notes (Signed)
Patient ID: Tracey Tucker, female    DOB: Apr 01, 1955, 65 y.o.   MRN: [id number]  PCP: Alba Cory, MD  Chief Complaint  Patient presents with  . Foot Swelling    Left onset 1 week    Subjective:   Tracey Tucker is a 65 y.o. female, presents to clinic with CC of the following:  Foot Injury  The incident occurred more than 1 week ago. Incident location: normal walking at work no injury. There was no injury mechanism. The quality of the pain is described as aching, shooting and stabbing. The pain is at a severity of 7/10. The pain has been worsening since onset. Pertinent negatives include no inability to bear weight, loss of motion, loss of sensation, muscle weakness, numbness or tingling. The symptoms are aggravated by movement and weight bearing. She has tried ice and elevation for the symptoms. The treatment provided mild relief.    Pt states she was walking down the hallway at work and left lateral foot started to feel a little achy and sore, it gradually worsened w/o any injury.  Pain rated 7/10 today, was more severe yesterday, hurts with putting full weight on foot, the other day she went to lift a box and planted foot firmly down and that acutely worsened pain.  She has swelling to left lateral foot and some to her left lateral ankle, pain seems to radiate a little to the bottom of her foot.  She states she can walk.  She has not taken any medications for pain.  She has applied ice Denies redness, hx of gout, old foot or ankle injury.   She denies hx of arthritis, through knee arthritis is on chart and she does see Dr. Hyacinth Meeker with [facility]. She has poorly controlled DM, last A1C was >9, she states endocrinology increased trulicity dose and blood sugars have been well controlled- in the morning they are around 120's She denies any past foot neuropathy or infections.  She has hx of left heel spur surgery  Patient Active Problem List   Diagnosis Date Noted  . Tobacco abuse  counseling 10/22/2017  . Morbid obesity (HCC) 10/22/2017  . Arthritis of left knee 09/18/2015  . Type 2 diabetes mellitus with hyperglycemia, without long-term current use of insulin (HCC) 08/22/2014  . Hypertension associated with diabetes (HCC) 08/22/2014  . Hyperlipidemia associated with type 2 diabetes mellitus (HCC) 08/22/2014  . Obesity (BMI 35.0-39.9 without comorbidity) 08/22/2014      Current Outpatient Medications:  .  aspirin 81 MG tablet, Take 81 mg by mouth daily. PM, Disp: , Rfl:  .  Blood Glucose Monitoring Suppl (FREESTYLE LITE) DEVI, USE AS DIRECTED, Disp: 1 each, Rfl: 0 .  Cholecalciferol (VITAMIN D) 2000 units CAPS, Take 1 capsule (2,000 Units total) by mouth daily., Disp: 90 capsule, Rfl: 1 .  Dapagliflozin-metFORMIN HCl ER (XIGDUO XR) 11-998 MG TB24, TAKE 1 TABLET BY MOUTH DAILY. IN PLACE OF METFORMIN AND GLYBURIDE, Disp: 90 tablet, Rfl: 0 .  Dulaglutide 3 MG/0.5ML SOPN, INJECT 3 MG UNDER THE SKIN ONCE A WEEK AS DIRECTED, Disp: 2 mL, Rfl: 2 .  FREESTYLE LITE test strip, CHECK SUGARS ONCE A DAY, Disp: 100 strip, Rfl: 2 .  glipiZIDE (GLUCOTROL XL) 10 MG 24 hr tablet, TAKE 1 TABLET BY MOUTH DAILY WITH BREAKFAST., Disp: 90 tablet, Rfl: 0 .  glucose blood test strip, , Disp: , Rfl:  .  glucose blood test strip, USE UP TO 4 TIMES A DAY AS DIRECTED (  Patient taking differently: USE UP TO 4 TIMES A DAY AS DIRECTED), Disp: 100 strip, Rfl: 2 .  Lancets (FREESTYLE) lancets, USE DAILY, Disp: 100 each, Rfl: 3 .  pioglitazone (ACTOS) 15 MG tablet, TAKE 1 TABLET BY MOUTH DAILY., Disp: 90 tablet, Rfl: 0 .  propranolol (INDERAL) 10 MG tablet, TAKE 1 TABLET BY MOUTH 2 (TWO) TIMES DAILY., Disp: 180 tablet, Rfl: 0 .  simvastatin (ZOCOR) 40 MG tablet, TAKE 1 TABLET (40 MG TOTAL) BY MOUTH AT BEDTIME., Disp: 90 tablet, Rfl: 1 .  tiotropium (SPIRIVA) 18 MCG inhalation capsule, PLACE 1 CAPSULE INTO INHALER AND INHALE BY MOUTH DAILY, Disp: 30 capsule, Rfl: 5 .  TRULICITY 4.5 MG/0.5ML SOPN,  INJECT 0.5 MLS (4.5 MG TOTAL) SUBCUTANEOUSLY EVERY SUNDAY, Disp: 2 mL, Rfl: 11 .  valsartan-hydrochlorothiazide (DIOVAN-HCT) 160-25 MG tablet, TAKE 1 TABLET BY MOUTH DAILY IN PLACE OF BUMEX AND BENAZEPRIL, Disp: 90 tablet, Rfl: 0   Not on File   Social History   Tobacco Use  . Smoking status: Current Every Day Smoker    Packs/day: 1.00    Years: 29.00    Pack years: 29.00    Types: Cigarettes  . Smokeless tobacco: Current User    Types: Snuff  Vaping Use  . Vaping Use: Never used  Substance Use Topics  . Alcohol use: No    Alcohol/week: 0.0 standard drinks  . Drug use: No      Chart Review Today: I personally reviewed active problem list, medication list, allergies, family history, social history, health maintenance, notes from last encounter, lab results, imaging with the patient/caregiver today.   Review of Systems  Constitutional: Negative.   HENT: Negative.   Eyes: Negative.   Respiratory: Negative.   Cardiovascular: Negative.   Gastrointestinal: Negative.   Endocrine: Negative.   Genitourinary: Negative.   Musculoskeletal: Positive for arthralgias and joint swelling. Negative for myalgias.  Skin: Negative.  Negative for color change, rash and wound.  Allergic/Immunologic: Negative.   Neurological: Negative.  Negative for tingling, weakness and numbness.  Hematological: Negative.   Psychiatric/Behavioral: Negative.   All other systems reviewed and are negative.      Objective:   Vitals:   06/06/20 1444 06/06/20 1452  BP: 94/64 98/68  Pulse: 73   Resp: 16   Temp: 97.8 F (36.6 C)   SpO2: 99%   Weight: 174 lb 1.6 oz (79 kg)   Height: 4\' 11"  (1.499 m)     Body mass index is 35.16 kg/m.  Physical Exam Vitals and nursing note reviewed.  Constitutional:      General: She is not in acute distress.    Appearance: Normal appearance. She is well-developed. She is obese. She is not ill-appearing, toxic-appearing or diaphoretic.  HENT:     Head:  Normocephalic and atraumatic.  Eyes:     General:        Right eye: No discharge.        Left eye: No discharge.     Conjunctiva/sclera: Conjunctivae normal.  Neck:     Trachea: No tracheal deviation.  Cardiovascular:     Rate and Rhythm: Normal rate.     Pulses:          Dorsalis pedis pulses are 2+ on the right side and 2+ on the left side.       Posterior tibial pulses are 2+ on the right side and 2+ on the left side.  Pulmonary:     Effort: Pulmonary effort is normal. No respiratory distress.  Breath sounds: No stridor.  Musculoskeletal:        General: Normal range of motion.     Left ankle: Swelling (mild lateral malleolus swelling ) present. No deformity. No tenderness. Normal range of motion.     Left Achilles Tendon: Normal. No tenderness.     Right foot: Normal range of motion and normal capillary refill. No deformity, bony tenderness or crepitus. Normal pulse.     Left foot: Normal range of motion and normal capillary refill. Swelling and bony tenderness (to 5th mid MT area) present. No deformity (mild swelling to dorsal foot over 4-5th MT and some to lateral malleolus) or crepitus. Normal pulse.     Comments: Grossly normal sensation to light touch to b/l LE and feet  Feet:     Right foot:     Skin integrity: Skin integrity normal. No ulcer, blister, skin breakdown, erythema, warmth, callus, dry skin or fissure.     Toenail Condition: Right toenails are abnormally thick.     Left foot:     Skin integrity: Skin integrity normal. No ulcer, blister, skin breakdown, erythema, warmth, callus, dry skin or fissure.     Toenail Condition: Left toenails are abnormally thick.  Skin:    General: Skin is warm and dry.     Findings: No erythema or rash.  Neurological:     Mental Status: She is alert.     Motor: No abnormal muscle tone.     Coordination: Coordination normal.     Gait: Gait abnormal.     Comments: Antalgic gait      Results for orders placed or performed in  visit on 02/15/20  Lipid panel  Result Value Ref Range   Cholesterol 157 <200 mg/dL   HDL 43 (L) > OR = 50 mg/dL   Triglycerides 621 <308 mg/dL   LDL Cholesterol (Calc) 92 mg/dL (calc)   Total CHOL/HDL Ratio 3.7 <5.0 (calc)   Non-HDL Cholesterol (Calc) 114 <130 mg/dL (calc)  Microalbumin / creatinine urine ratio  Result Value Ref Range   Creatinine, Urine 82 20 - 275 mg/dL   Microalb, Ur 0.2 mg/dL   Microalb Creat Ratio 2 <30 mcg/mg creat  COMPLETE METABOLIC PANEL WITH GFR  Result Value Ref Range   Glucose, Bld 152 (H) 65 - 99 mg/dL   BUN 23 7 - 25 mg/dL   Creat 6.57 (H) 8.46 - 0.99 mg/dL   GFR, Est Non African American 55 (L) > OR = 60 mL/min/1.59m2   GFR, Est African American 64 > OR = 60 mL/min/1.21m2   BUN/Creatinine Ratio 22 6 - 22 (calc)   Sodium 134 (L) 135 - 146 mmol/L   Potassium 4.1 3.5 - 5.3 mmol/L   Chloride 96 (L) 98 - 110 mmol/L   CO2 29 20 - 32 mmol/L   Calcium 10.1 8.6 - 10.4 mg/dL   Total Protein 7.4 6.1 - 8.1 g/dL   Albumin 4.6 3.6 - 5.1 g/dL   Globulin 2.8 1.9 - 3.7 g/dL (calc)   AG Ratio 1.6 1.0 - 2.5 (calc)   Total Bilirubin 0.3 0.2 - 1.2 mg/dL   Alkaline phosphatase (APISO) 56 37 - 153 U/L   AST 16 10 - 35 U/L   ALT 14 6 - 29 U/L  CBC with Differential/Platelet  Result Value Ref Range   WBC 6.0 3.8 - 10.8 Thousand/uL   RBC 5.41 (H) 3.80 - 5.10 Million/uL   Hemoglobin 15.1 11.7 - 15.5 g/dL   HCT 96.2 (H) 95.2 -  45.0 %   MCV 84.7 80.0 - 100.0 fL   MCH 27.9 27.0 - 33.0 pg   MCHC 33.0 32.0 - 36.0 g/dL   RDW 09.812.8 11.911.0 - 14.715.0 %   Platelets 255 140 - 400 Thousand/uL   MPV 11.1 7.5 - 12.5 fL   Neutro Abs 3,324 1,500 - 7,800 cells/uL   Lymphs Abs 1,890 850 - 3,900 cells/uL   Absolute Monocytes 714 200 - 950 cells/uL   Eosinophils Absolute 30 15 - 500 cells/uL   Basophils Absolute 42 0 - 200 cells/uL   Neutrophils Relative % 55.4 %   Total Lymphocyte 31.5 %   Monocytes Relative 11.9 %   Eosinophils Relative 0.5 %   Basophils Relative 0.7 %  VITAMIN  D 25 Hydroxy (Vit-D Deficiency, Fractures)  Result Value Ref Range   Vit D, 25-Hydroxy 31 30 - 100 ng/mL  Vitamin B12  Result Value Ref Range   Vitamin B-12 448 200 - 1,100 pg/mL  POCT HgB A1C  Result Value Ref Range   Hemoglobin A1C 9.2 (A) 4.0 - 5.6 %   HbA1c POC (<> result, manual entry)     HbA1c, POC (prediabetic range)     HbA1c, POC (controlled diabetic range)         Assessment & Plan:   1. Swelling of left foot Swelling and ttp to left lateral dorsal foot over mid 5th MT area - start with X-ray to eval for stress fx No injury, no warmth or erythema Reviewed OTC meds and dosing she can take PRN for pain, encouraged conservative management while getting xrays - ice, rest, elevate, work note given.  If Xray negative we discussed labs work up that could be done to find other causes - r/o gout, infection, other inflammatory conditions? May be arthritic flare?  Or other tendon/ligament inflammation  Refer to foot/ankle specialists - discussed podiatry but she preferred to go to [facility]  Would be difficult to get her brace or crutches w/o xray or diagnosis - hopefully if needed she can get appt soon with specialists - for now conservative mgmt After lengthy discussion pt preferred to not do labs today and return to do them if Xray neg or if sx do not improve  - CBC with Differential/Platelet - COMPLETE METABOLIC PANEL WITH GFR - Uric acid - C-reactive protein - ANA - Rheumatoid factor - Sedimentation rate - DG Foot Complete Left; Future - Ambulatory referral to Orthopedic Surgery  2. Morbid obesity (HCC)  3. Uncontrolled type 2 diabetes mellitus with hyperglycemia (HCC) She states sugars have been better controlled Lab Results  Component Value Date   HGBA1C 9.2 (A) 02/15/2020  does put pt at higher risk for pathology in feet, no hx of neuropathy, diabetic wounds etc - COMPLETE METABOLIC PANEL WITH GFR  4. Hypertension associated with diabetes (HCC) - COMPLETE  METABOLIC PANEL WITH GFR  5. Osteopenia, unspecified location - COMPLETE METABOLIC PANEL WITH GFR  6. History of arthritis - CBC with Differential/Platelet   Handout and info given to pt on conservative management Advised and annotated AVS with dosing for tylenol - 500-650 mg TID PRN, and aleve 220 mg or naproxen 500 mg BID PRN as needed for pain - advised pt to be careful with NSAIDs   On exam did not appear to be infectious or suspicious for gout - explained to pt that if no positive findings with xray and/or labs specialists should eval if not improving Work note given to excuse pt from work through  the next 2 days given  Pt seemed displeased when she left the office today - I wish we had braces or crutches available to give her to help minimized her pain and increase her ability to rest foot, but not available and expensive if prescribed Pt stated prior to leaving that she did not have any OTC meds at home to try- so I told her I would write down the dosing for her, she could get OTC, I asked Asher Muir to give pt samples that we have available up front.  I explained insurance will not cover OTC meds even if I did prescribe them.     Danelle Berry, PA-C 06/06/20 3:11 PM

## 2020-06-06 NOTE — Patient Instructions (Signed)
Foot Pain Many things can cause foot pain. Some common causes are:  An injury.  A sprain.  Arthritis.  Blisters.  Bunions. Follow these instructions at home: Managing pain, stiffness, and swelling If directed, put ice on the painful area:  Put ice in a plastic bag.  Place a towel between your skin and the bag.  Leave the ice on for 20 minutes, 2-3 times a day.   Activity  Do not stand or walk for long periods.  Return to your normal activities as told by your health care provider. Ask your health care provider what activities are safe for you.  Do stretches to relieve foot pain and stiffness as told by your health care provider.  Do not lift anything that is heavier than 10 lb (4.5 kg), or the limit that you are told, until your health care provider says that it is safe. Lifting a lot of weight can put added pressure on your feet. Lifestyle  Wear comfortable, supportive shoes that fit you well. Do not wear high heels.  Keep your feet clean and dry. General instructions  Take over-the-counter and prescription medicines only as told by your health care provider.  Rub your foot gently.  Pay attention to any changes in your symptoms.  Keep all follow-up visits as told by your health care provider. This is important. Contact a health care provider if:  Your pain does not get better after a few days of self-care.  Your pain gets worse.  You cannot stand on your foot. Get help right away if:  Your foot is numb or tingling.  Your foot or toes are swollen.  Your foot or toes turn white or blue.  You have warmth and redness along your foot. Summary  Common causes of foot pain are injury, sprain, arthritis, blisters or bunions.  Ice, medicines, and comfortable shoes may help foot pain.  Contact your health care provider if your pain does not get better after a few days of self-care. This information is not intended to replace advice given to you by your health  care provider. Make sure you discuss any questions you have with your health care provider. Document Revised: 11/12/2017 Document Reviewed: 11/12/2017 Elsevier Patient Education  2021 [employer] Inc.   How to Use Cold Therapy Cold therapy, or cryotherapy, is a treatment that uses cold temperatures to treat an injury or medical condition. It includes using cold packs or ice packs to reduce pain and swelling. Only use cold therapy if your doctor says it is okay. What are the risks? Generally, cold therapy is a safe treatment. However, it is not safe for:  People who are not able to say they are in pain. These include small children and people who have memory problems.  People who have certain conditions, such as: ? A problem in the vessels that slows blood flow to the fingers and toes (Raynaud's syndrome). ? Feeling very cold easily (cold hypersensitivity). ? Lack of feeling in the area being iced. Cold therapy may not be safe for people who have other conditions. Do not use it without talking to your doctor if you have:  A heart condition.  High blood pressure.  Open or healing wounds.  An infection.  Pain and swelling in your joints (rheumatoid arthritis).  Poor blood flow in the body.  Diabetes.  Certain skin conditions. How can I make a cold pack? When using a cold pack at home to reduce pain and swelling, you can use:  A silica gel cold pack that has been left in the freezer. You can buy this online or in stores.  A sealable plastic bag that has been filled with crushed ice.  A washcloth or paper towels soaked in cold (or ice) water.  A plastic bag of frozen vegetables. Throw them away when you are finished using them as a cold pack. Supplies needed:  A cold pack.  A towel. This can be dry or damp, based on what you like. How to use cold therapy 1. Have your cold pack ready. 2. Place a towel between the cold pack and your skin. You may also wrap the cold pack in a  towel. 3. Put the cold pack on the affected area. Keep it on for no more than 20 minutes at a time. 4. Check your skin after 5 minutes to make sure that there is no damage to the area. Check for: ? White spots on your skin. Your skin may look blotchy or mottled. ? Skin that looks blue or pale. ? Skin that feels waxy or hard. 5. Repeat these steps as many times each day as told by your doctor. Always use a towel to avoid direct contact with your skin.   Contact a doctor if:  You start to have white spots on your skin. This may give your skin a blotchy or mottled look.  Your skin turns blue or pale.  Your skin becomes waxy or hard.  Your swelling gets worse. Summary  Cold therapy, or cryotherapy, is used to treat an injury or other conditions. It includes using cold packs or ice packs to reduce pain and swelling.  Cold therapy is not safe for people who are not able to say they are in pain.  When using cold packs or ice packs, always place a towel between the cold source and your skin.  Check your skin after 5 minutes of icing it. This is to make sure that there is no skin damage.  Contact your doctor if you notice changes in your skin or your swelling gets worse. This information is not intended to replace advice given to you by your health care provider. Make sure you discuss any questions you have with your health care provider. Document Revised: 10/13/2019 Document Reviewed: 10/26/2017 Elsevier Patient Education  2021 [employer].

## 2020-06-11 ENCOUNTER — Other Ambulatory Visit: Payer: Self-pay | Admitting: Family Medicine

## 2020-06-11 ENCOUNTER — Telehealth: Payer: Self-pay

## 2020-06-11 DIAGNOSIS — I10 Essential (primary) hypertension: Secondary | ICD-10-CM

## 2020-06-11 MED ORDER — PROPRANOLOL HCL 10 MG PO TABS
ORAL_TABLET | Freq: Two times a day (BID) | ORAL | 0 refills | Status: DC
  Filled 2020-06-11: qty 180, 90d supply, fill #0

## 2020-06-11 NOTE — Telephone Encounter (Signed)
Pt calling to speak to Dois Davenport regarding her FMLA paperwork [id number]

## 2020-06-11 NOTE — Telephone Encounter (Signed)
Requested Prescriptions  Pending Prescriptions Disp Refills  . propranolol (INDERAL) 10 MG tablet 180 tablet 0    Sig: TAKE 1 TABLET BY MOUTH 2 (TWO) TIMES DAILY.     Cardiovascular:  Beta Blockers Passed - 06/11/2020  9:29 PM      Passed - Last BP in normal range    BP Readings from Last 1 Encounters:  06/06/20 98/68         Passed - Last Heart Rate in normal range    Pulse Readings from Last 1 Encounters:  06/06/20 73         Passed - Valid encounter within last 6 months    Recent Outpatient Visits          5 days ago Swelling of left foot   [facility] Danelle Berry, PA-C   3 months ago Diabetes mellitus type 2 in obese [facility])   [facility] [place], Danna Hefty, MD   7 months ago Diabetes mellitus type 2 in obese [facility])   [facility] Alba Cory, MD   10 months ago Dyslipidemia associated with type 2 diabetes mellitus [facility])   [facility] Alba Cory, MD   1 year ago Essential hypertension   [facility] Doren Custard, [place]      Future Appointments            In 2 weeks Alba Cory, MD [facility], [facility]

## 2020-06-11 NOTE — Telephone Encounter (Signed)
Attempted to contact patient regarding FMLA paperwork. Left voicemail. Awaiting return call.

## 2020-06-12 ENCOUNTER — Other Ambulatory Visit: Payer: Self-pay

## 2020-06-12 NOTE — Telephone Encounter (Signed)
Spoke with Patient. FMLA form filled out.

## 2020-06-13 ENCOUNTER — Other Ambulatory Visit: Payer: Self-pay

## 2020-06-13 DIAGNOSIS — M13872 Other specified arthritis, left ankle and foot: Secondary | ICD-10-CM | POA: Diagnosis not present

## 2020-06-13 MED ORDER — MELOXICAM 15 MG PO TABS
ORAL_TABLET | ORAL | 2 refills | Status: DC
  Filled 2020-06-13: qty 30, 30d supply, fill #0
  Filled 2020-07-15: qty 30, 30d supply, fill #1

## 2020-06-24 MED FILL — Dulaglutide Soln Auto-injector 4.5 MG/0.5ML: SUBCUTANEOUS | 28 days supply | Qty: 2 | Fill #1 | Status: AC

## 2020-06-25 ENCOUNTER — Other Ambulatory Visit: Payer: Self-pay

## 2020-06-29 ENCOUNTER — Other Ambulatory Visit: Payer: Self-pay

## 2020-06-29 ENCOUNTER — Ambulatory Visit (INDEPENDENT_AMBULATORY_CARE_PROVIDER_SITE_OTHER): Payer: 59 | Admitting: Family Medicine

## 2020-06-29 ENCOUNTER — Encounter: Payer: Self-pay | Admitting: Family Medicine

## 2020-06-29 VITALS — BP 110/68 | HR 83 | Temp 98.1°F | Resp 16 | Ht 59.0 in | Wt 173.0 lb

## 2020-06-29 DIAGNOSIS — E1169 Type 2 diabetes mellitus with other specified complication: Secondary | ICD-10-CM | POA: Diagnosis not present

## 2020-06-29 DIAGNOSIS — E559 Vitamin D deficiency, unspecified: Secondary | ICD-10-CM | POA: Diagnosis not present

## 2020-06-29 DIAGNOSIS — I152 Hypertension secondary to endocrine disorders: Secondary | ICD-10-CM | POA: Diagnosis not present

## 2020-06-29 DIAGNOSIS — J410 Simple chronic bronchitis: Secondary | ICD-10-CM | POA: Diagnosis not present

## 2020-06-29 DIAGNOSIS — E1159 Type 2 diabetes mellitus with other circulatory complications: Secondary | ICD-10-CM | POA: Diagnosis not present

## 2020-06-29 DIAGNOSIS — E669 Obesity, unspecified: Secondary | ICD-10-CM | POA: Diagnosis not present

## 2020-06-29 DIAGNOSIS — Z23 Encounter for immunization: Secondary | ICD-10-CM

## 2020-06-29 DIAGNOSIS — E785 Hyperlipidemia, unspecified: Secondary | ICD-10-CM | POA: Diagnosis not present

## 2020-06-29 LAB — POCT GLYCOSYLATED HEMOGLOBIN (HGB A1C): Hemoglobin A1C: 6.8 % — AB (ref 4.0–5.6)

## 2020-06-29 MED ORDER — SIMVASTATIN 40 MG PO TABS
ORAL_TABLET | Freq: Every day | ORAL | 1 refills | Status: DC
Start: 2020-06-29 — End: 2020-10-01
  Filled 2020-06-29 – 2020-08-17 (×2): qty 90, 90d supply, fill #0

## 2020-06-29 NOTE — Progress Notes (Signed)
Name: Tracey Tucker   MRN: [id number]    DOB: 1955-11-09   Date:06/29/2020       Progress Note  Subjective  Chief Complaint  Follow Up  HPI  DMII: She was doing well through 2020 and early 2021,however went twice above 9 %, we referred her to Endo, dose of Trulicity was adjusted to 4.5 mg, Xigduo XR and Glipizide 10 mg also on Actos and she resumed a diabetic diet, today A1C is down to 6.8 %. . She states fasting level 82-170, most of the time around 120-130's  She denies polyphagia, polydipsia and polyuria. She has dyslipidemia , HTN and obesity on statin therapy and ARB  HTN: she has hypertension, compliant with medication, Denies chest pain,palpitation or SOB. BP is towards low end of normal , she denies dizziness, we may needs to adjust dose of medication   Dyslipidemia: she is on statin therapy, last LDL went up from 65 to 92, she states she has been taking Simvastatin daily, she is not sure why it went up   Obesity: BMI is now below 35, she has been more compliant with her diet, taking Trulicity that is also helping with weight loss   Chronic bronchitis: she has been smoking for 30 years, she states cough no longer daily, she has been trying to quit smoking down to 8 cigarettes per day but still dip snuff,she states no recent episode of wheezing or sob. She states Spiriva works well for her . Not interested in lung cancer screen    Patient Active Problem List   Diagnosis Date Noted  . Tobacco abuse counseling 10/22/2017  . Morbid obesity (HCC) 10/22/2017  . Arthritis of left knee 09/18/2015  . Type 2 diabetes mellitus with hyperglycemia, without long-term current use of insulin (HCC) 08/22/2014  . Hypertension associated with diabetes (HCC) 08/22/2014  . Hyperlipidemia associated with type 2 diabetes mellitus (HCC) 08/22/2014  . Obesity (BMI 35.0-39.9 without comorbidity) 08/22/2014    Past Surgical History:  Procedure Laterality Date  . CATARACT EXTRACTION Left 05/17/14    [facility] [place]  . CATARACT EXTRACTION W/PHACO Right 07/26/2014   Procedure: CATARACT EXTRACTION PHACO AND INTRAOCULAR LENS PLACEMENT (IOC);  Surgeon: Lockie Mola, MD;  [facility];  Service: Ophthalmology;  Laterality: Right;  DIABETIC  . CESAREAN SECTION    . FOOT SURGERY  2014    Family History  Problem Relation Age of Onset  . Diabetes Father   . Heart disease Father   . Diabetes Sister   . Hyperlipidemia Sister   . Hypertension Sister   . Hypertension Brother   . Diabetes Brother   . Diabetes Sister   . Kidney disease Sister   . Heart attack Sister   . Diabetes Sister   . Diabetes Sister   . Diabetes Brother   . Stomach cancer Brother   . Stroke Brother   . Breast cancer Neg Hx     Social History   Tobacco Use  . Smoking status: Current Every Day Smoker    Packs/day: 1.00    Years: 29.00    Pack years: 29.00    Types: Cigarettes  . Smokeless tobacco: Current User    Types: Snuff  Substance Use Topics  . Alcohol use: No    Alcohol/week: 0.0 standard drinks     Current Outpatient Medications:  .  aspirin 81 MG tablet, Take 81 mg by mouth daily. PM, Disp: , Rfl:  .  Blood Glucose Monitoring Suppl (FREESTYLE LITE)  DEVI, USE AS DIRECTED, Disp: 1 each, Rfl: 0 .  Cholecalciferol (VITAMIN D) 2000 units CAPS, Take 1 capsule (2,000 Units total) by mouth daily., Disp: 90 capsule, Rfl: 1 .  Dapagliflozin-metFORMIN HCl ER (XIGDUO XR) 11-998 MG TB24, TAKE 1 TABLET BY MOUTH DAILY. IN PLACE OF METFORMIN AND GLYBURIDE, Disp: 90 tablet, Rfl: 0 .  FREESTYLE LITE test strip, CHECK SUGARS ONCE A DAY, Disp: 100 strip, Rfl: 2 .  glipiZIDE (GLUCOTROL XL) 10 MG 24 hr tablet, TAKE 1 TABLET BY MOUTH DAILY WITH BREAKFAST., Disp: 90 tablet, Rfl: 0 .  glucose blood test strip, USE UP TO 4 TIMES A DAY AS DIRECTED (Patient taking differently: USE UP TO 4 TIMES A DAY AS DIRECTED), Disp: 100 strip, Rfl: 2 .  meloxicam (MOBIC) 15 MG tablet, Take 1 tablet every day  by oral route., Disp: 30 tablet, Rfl: 2 .  pioglitazone (ACTOS) 15 MG tablet, TAKE 1 TABLET BY MOUTH DAILY., Disp: 90 tablet, Rfl: 0 .  propranolol (INDERAL) 10 MG tablet, TAKE 1 TABLET BY MOUTH 2 (TWO) TIMES DAILY., Disp: 180 tablet, Rfl: 0 .  simvastatin (ZOCOR) 40 MG tablet, TAKE 1 TABLET (40 MG TOTAL) BY MOUTH AT BEDTIME., Disp: 90 tablet, Rfl: 1 .  tiotropium (SPIRIVA) 18 MCG inhalation capsule, PLACE 1 CAPSULE INTO INHALER AND INHALE BY MOUTH DAILY, Disp: 30 capsule, Rfl: 5 .  TRULICITY 4.5 MG/0.5ML SOPN, INJECT 0.5 MLS (4.5 MG TOTAL) SUBCUTANEOUSLY EVERY SUNDAY, Disp: 2 mL, Rfl: 11 .  valsartan-hydrochlorothiazide (DIOVAN-HCT) 160-25 MG tablet, TAKE 1 TABLET BY MOUTH DAILY IN PLACE OF BUMEX AND BENAZEPRIL, Disp: 90 tablet, Rfl: 0  No Known Allergies  I personally reviewed active problem list, medication list, allergies, family history, social history, health maintenance with the patient/caregiver today.   ROS  Constitutional: Negative for fever or weight change.  Respiratory: Negative for cough and shortness of breath.   Cardiovascular: Negative for chest pain or palpitations.  Gastrointestinal: Negative for abdominal pain, no bowel changes.  Musculoskeletal: Negative for gait problem or joint swelling.  Skin: Negative for rash.  Neurological: Negative for dizziness or headache.  No other specific complaints in a complete review of systems (except as listed in HPI above).  Objective  Vitals:   06/29/20 0937  BP: 110/68  Pulse: 83  Resp: 16  Temp: 98.1 F (36.7 C)  TempSrc: Oral  SpO2: 99%  Weight: 173 lb (78.5 kg)  Height: 4\' 11"  (1.499 m)    Body mass index is 34.94 kg/m.  Physical Exam  Constitutional: Patient appears well-developed and well-nourished. Obese No distress.  HEENT: head atraumatic, normocephalic, pupils equal and reactive to light,, neck supple Cardiovascular: Normal rate, regular rhythm and normal heart sounds.  No murmur heard. No BLE  edema. Pulmonary/Chest: Effort normal and breath sounds normal. No respiratory distress. Abdominal: Soft.  There is no tenderness. Psychiatric: Patient has a normal mood and affect. behavior is normal. Judgment and thought content normal.  Recent Results (from the past 2160 hour(s))  POCT HgB A1C     Status: Abnormal   Collection Time: 06/29/20  9:47 AM  Result Value Ref Range   Hemoglobin A1C 6.8 (A) 4.0 - 5.6 %   HbA1c POC (<> result, manual entry)     HbA1c, POC (prediabetic range)     HbA1c, POC (controlled diabetic range)        PHQ2/9: Depression screen [facility] 2/9 06/29/2020 06/06/2020 02/15/2020 11/07/2019 07/18/2019  Decreased Interest 0 0 0 0 0  Down, Depressed, Hopeless 0  0 0 0 0  PHQ - 2 Score 0 0 0 0 0  Altered sleeping - 0 - - 0  Tired, decreased energy - 0 - - 0  Change in appetite - 0 - - 0  Feeling bad or failure about yourself  - 0 - - 0  Trouble concentrating - 0 - - 0  Moving slowly or fidgety/restless - 0 - - 0  Suicidal thoughts - 0 - - 0  PHQ-9 Score - 0 - - 0  Difficult doing work/chores - Not difficult at all - - -  Some recent data might be hidden    phq 9 is negative   Fall Risk: Fall Risk  06/29/2020 06/06/2020 02/15/2020 11/07/2019 07/18/2019  Falls in the past year? 0 0 0 0 0  Number falls in past yr: 0 0 0 0 -  Injury with Fall? 0 0 0 0 -  Follow up - - Falls evaluation completed - -     Functional Status Survey: Is the patient deaf or have difficulty hearing?: No Does the patient have difficulty seeing, even when wearing glasses/contacts?: No Does the patient have difficulty concentrating, remembering, or making decisions?: No Does the patient have difficulty walking or climbing stairs?: No Does the patient have difficulty dressing or bathing?: No Does the patient have difficulty doing errands alone such as visiting a doctor's office or shopping?: No    Assessment & Plan  1. Diabetes mellitus type 2 in obese (HCC)  - POCT HgB A1C  2.  Dyslipidemia  - simvastatin (ZOCOR) 40 MG tablet; TAKE 1 TABLET (40 MG TOTAL) BY MOUTH AT BEDTIME.  Dispense: 90 tablet; Refill: 1   3. Hypertension associated with diabetes (HCC)   4. Obesity (BMI 30.0-34.9)  Discussed with the patient the risk posed by an increased BMI. Discussed importance of portion control, calorie counting and at least 150 minutes of physical activity weekly. Avoid sweet beverages and drink more water. Eat at least 6 servings of fruit and vegetables daily   5. Simple chronic bronchitis (HCC)   6. Vitamin D deficiency   7. Hyperlipidemia associated with type 2 diabetes mellitus (HCC)  - simvastatin (ZOCOR) 40 MG tablet; TAKE 1 TABLET (40 MG TOTAL) BY MOUTH AT BEDTIME.  Dispense: 90 tablet; Refill: 1  8. Need for shingles vaccine  - Varicella-zoster vaccine IM

## 2020-07-15 ENCOUNTER — Other Ambulatory Visit: Payer: Self-pay | Admitting: Family Medicine

## 2020-07-15 DIAGNOSIS — I10 Essential (primary) hypertension: Secondary | ICD-10-CM

## 2020-07-15 MED ORDER — VALSARTAN-HYDROCHLOROTHIAZIDE 160-25 MG PO TABS
ORAL_TABLET | ORAL | 0 refills | Status: DC
  Filled 2020-07-15: qty 90, 90d supply, fill #0

## 2020-07-15 NOTE — Telephone Encounter (Signed)
Requested Prescriptions  Pending Prescriptions Disp Refills  . valsartan-hydrochlorothiazide (DIOVAN-HCT) 160-25 MG tablet 90 tablet 0    Sig: TAKE 1 TABLET BY MOUTH DAILY IN PLACE OF BUMEX AND BENAZEPRIL     Cardiovascular: ARB + Diuretic Combos Failed - 07/15/2020  6:34 PM      Failed - Na in normal range and within 180 days    Sodium  Date Value Ref Range Status  02/15/2020 134 (L) 135 - 146 mmol/L Final  10/22/2012 139 136 - 145 mmol/L Final         Failed - Cr in normal range and within 180 days    Creat  Date Value Ref Range Status  02/15/2020 1.06 (H) 0.50 - 0.99 mg/dL Final    Comment:    For patients >23 years of age, the reference limit for Creatinine is approximately 13% higher for people identified as African-American. .    Creatinine, Urine  Date Value Ref Range Status  02/15/2020 82 20 - 275 mg/dL Final         Passed - K in normal range and within 180 days    Potassium  Date Value Ref Range Status  02/15/2020 4.1 3.5 - 5.3 mmol/L Final  10/22/2012 3.9 3.5 - 5.1 mmol/L Final         Passed - Ca in normal range and within 180 days    Calcium  Date Value Ref Range Status  02/15/2020 10.1 8.6 - 10.4 mg/dL Final   Calcium, Total  Date Value Ref Range Status  10/22/2012 9.7 8.5 - 10.1 mg/dL Final         Passed - Patient is not pregnant      Passed - Last BP in normal range    BP Readings from Last 1 Encounters:  06/29/20 110/68         Passed - Valid encounter within last 6 months    Recent Outpatient Visits          2 weeks ago Diabetes mellitus type 2 in obese [facility])   [facility] Alba Cory, MD   1 month ago Swelling of left foot   [facility] [place], Sheliah Mends, PA-C   5 months ago Diabetes mellitus type 2 in obese [facility])   [facility] [place], Danna Hefty, MD   8 months ago Diabetes mellitus type 2 in obese [facility])   [facility] Alba Cory, MD   12 months ago  Dyslipidemia associated with type 2 diabetes mellitus [facility])   [address] [facility] Alba Cory, MD      Future Appointments            In 1 month Carlynn Purl, Danna Hefty, MD [facility], [facility]   In 2 months Alba Cory, MD [facility], [facility]

## 2020-07-16 ENCOUNTER — Other Ambulatory Visit: Payer: Self-pay

## 2020-07-19 ENCOUNTER — Other Ambulatory Visit: Payer: Self-pay | Admitting: Family Medicine

## 2020-07-19 ENCOUNTER — Other Ambulatory Visit: Payer: Self-pay

## 2020-07-19 DIAGNOSIS — E1169 Type 2 diabetes mellitus with other specified complication: Secondary | ICD-10-CM

## 2020-07-19 MED ORDER — XIGDUO XR 10-1000 MG PO TB24
ORAL_TABLET | ORAL | 1 refills | Status: DC
  Filled 2020-07-19 – 2020-08-17 (×4): qty 90, 90d supply, fill #0
  Filled 2020-11-11: qty 90, 90d supply, fill #1

## 2020-07-19 NOTE — Telephone Encounter (Signed)
Requested Prescriptions  Pending Prescriptions Disp Refills  . Dapagliflozin-metFORMIN HCl ER (XIGDUO XR) 11-998 MG TB24 90 tablet 1    Sig: TAKE 1 TABLET BY MOUTH DAILY. IN PLACE OF METFORMIN AND GLYBURIDE     Endocrinology:  Diabetes - Biguanide + SGLT2 Inhibitor Combos Failed - 07/19/2020  6:10 AM      Failed - Cr in normal range and within 360 days    Creat  Date Value Ref Range Status  02/15/2020 1.06 (H) 0.50 - 0.99 mg/dL Final    Comment:    For patients >28 years of age, the reference limit for Creatinine is approximately 13% higher for people identified as African-American. .    Creatinine, Urine  Date Value Ref Range Status  02/15/2020 82 20 - 275 mg/dL Final         Passed - LDL in normal range and within 360 days    Ldl Cholesterol, Calc  Date Value Ref Range Status  10/22/2012 69 0 - 100 mg/dL Final   LDL Cholesterol (Calc)  Date Value Ref Range Status  02/15/2020 92 mg/dL (calc) Final    Comment:    Reference range: <100 . Desirable range <100 mg/dL for primary prevention;   <70 mg/dL for patients with CHD or diabetic patients  with > or = 2 CHD risk factors. Marland Kitchen LDL-C is now calculated using the Martin-Hopkins  calculation, which is a validated novel method providing  better accuracy than the Friedewald equation in the  estimation of LDL-C.  Cresenciano Genre et al. Annamaria Helling. 7782;423(53): 2061-2068  (http://education.[employer].com/faq/FAQ164)          Passed - HBA1C is between 0 and 7.9 and within 180 days    Hemoglobin A1C  Date Value Ref Range Status  06/29/2020 6.8 (A) 4.0 - 5.6 % Final   Hgb A1c MFr Bld  Date Value Ref Range Status  10/27/2018 6.6 (H) <5.7 % of total Hgb Final    Comment:    For someone without known diabetes, a hemoglobin A1c value of 6.5% or greater indicates that they may have  diabetes and this should be confirmed with a follow-up  test. . For someone with known diabetes, a value <7% indicates  that their diabetes is well  controlled and a value  greater than or equal to 7% indicates suboptimal  control. A1c targets should be individualized based on  duration of diabetes, age, comorbid conditions, and  other considerations. . Currently, no consensus exists regarding use of hemoglobin A1c for diagnosis of diabetes for children. .          Passed - AA eGFR in normal range and within 360 days    GFR, Est African American  Date Value Ref Range Status  02/15/2020 64 > OR = 60 mL/min/1.64m Final   GFR, Est Non African American  Date Value Ref Range Status  02/15/2020 55 (L) > OR = 60 mL/min/1.710mFinal         Passed - Valid encounter within last 6 months    Recent Outpatient Visits          2 weeks ago Diabetes mellitus type 2 in obese [facility]   1 month ago Swelling of left foot   [facility]   5 months ago Diabetes mellitus type 2 in obese ([facility]   8 months ago Diabetes mellitus type 2 in  obese [facility])   [facility] [place], Drue Stager, MD   1 year ago Dyslipidemia associated with type 2 diabetes mellitus [facility])   [facility] Steele Sizer, MD      Future Appointments            In 1 month Ancil Boozer, Drue Stager, MD [facility] [place]   In 2 months Steele Sizer, MD [facility], [facility]

## 2020-07-22 MED FILL — Dulaglutide Soln Auto-injector 4.5 MG/0.5ML: SUBCUTANEOUS | 28 days supply | Qty: 2 | Fill #2 | Status: AC

## 2020-07-23 ENCOUNTER — Other Ambulatory Visit: Payer: Self-pay

## 2020-07-27 ENCOUNTER — Other Ambulatory Visit: Payer: Self-pay

## 2020-08-12 ENCOUNTER — Other Ambulatory Visit: Payer: Self-pay | Admitting: Family Medicine

## 2020-08-12 DIAGNOSIS — E1169 Type 2 diabetes mellitus with other specified complication: Secondary | ICD-10-CM

## 2020-08-12 MED ORDER — GLIPIZIDE ER 10 MG PO TB24
ORAL_TABLET | Freq: Every day | ORAL | 0 refills | Status: DC
  Filled 2020-08-12: qty 90, 90d supply, fill #0

## 2020-08-12 MED ORDER — PIOGLITAZONE HCL 15 MG PO TABS
ORAL_TABLET | Freq: Every day | ORAL | 0 refills | Status: DC
  Filled 2020-08-12: qty 90, 90d supply, fill #0

## 2020-08-12 NOTE — Telephone Encounter (Signed)
Requested Prescriptions  Pending Prescriptions Disp Refills  . glipiZIDE (GLUCOTROL XL) 10 MG 24 hr tablet 90 tablet 0    Sig: Take by mouth daily with breakfast.     Endocrinology:  Diabetes - Sulfonylureas Passed - 08/12/2020  6:39 AM      Passed - HBA1C is between 0 and 7.9 and within 180 days    Hemoglobin A1C  Date Value Ref Range Status  06/29/2020 6.8 (A) 4.0 - 5.6 % Final   Hgb A1c MFr Bld  Date Value Ref Range Status  10/27/2018 6.6 (H) <5.7 % of total Hgb Final    Comment:    For someone without known diabetes, a hemoglobin A1c value of 6.5% or greater indicates that they may have  diabetes and this should be confirmed with a follow-up  test. . For someone with known diabetes, a value <7% indicates  that their diabetes is well controlled and a value  greater than or equal to 7% indicates suboptimal  control. A1c targets should be individualized based on  duration of diabetes, age, comorbid conditions, and  other considerations. . Currently, no consensus exists regarding use of hemoglobin A1c for diagnosis of diabetes for children. Verna Czech - Valid encounter within last 6 months    Recent Outpatient Visits          1 month ago Diabetes mellitus type 2 in obese [facility])   [facility] Alba Cory, MD   2 months ago Swelling of left foot   [facility] [place], Sheliah Mends, PA-C   5 months ago Diabetes mellitus type 2 in obese [facility])   [facility] Alba Cory, MD   9 months ago Diabetes mellitus type 2 in obese [facility])   [facility] Alba Cory, MD   1 year ago Dyslipidemia associated with type 2 diabetes mellitus [facility])   [facility] Alba Cory, MD      Future Appointments            In 2 weeks Alba Cory, MD [facility], [facility]   In 1 month Alba Cory, MD [facility], [facility]           . pioglitazone  (ACTOS) 15 MG tablet 90 tablet 0    Sig: Take by mouth daily.     Endocrinology:  Diabetes - Glitazones - pioglitazone Passed - 08/12/2020  6:39 AM      Passed - HBA1C is between 0 and 7.9 and within 180 days    Hemoglobin A1C  Date Value Ref Range Status  06/29/2020 6.8 (A) 4.0 - 5.6 % Final   Hgb A1c MFr Bld  Date Value Ref Range Status  10/27/2018 6.6 (H) <5.7 % of total Hgb Final    Comment:    For someone without known diabetes, a hemoglobin A1c value of 6.5% or greater indicates that they may have  diabetes and this should be confirmed with a follow-up  test. . For someone with known diabetes, a value <7% indicates  that their diabetes is well controlled and a value  greater than or equal to 7% indicates suboptimal  control. A1c targets should be individualized based on  duration of diabetes, age, comorbid conditions, and  other considerations. . Currently, no consensus exists regarding use of hemoglobin A1c for diagnosis of diabetes for children. Verna Czech -  Valid encounter within last 6 months    Recent Outpatient Visits          1 month ago Diabetes mellitus type 2 in obese [facility])   [facility] Alba Cory, MD   2 months ago Swelling of left foot   [facility] [place], Sheliah Mends, PA-C   5 months ago Diabetes mellitus type 2 in obese [facility])   [facility] Alba Cory, MD   9 months ago Diabetes mellitus type 2 in obese [facility])   [facility] Alba Cory, MD   1 year ago Dyslipidemia associated with type 2 diabetes mellitus [facility])   [facility] Alba Cory, MD      Future Appointments            In 2 weeks Alba Cory, MD [facility], [facility]   In 1 month Alba Cory, MD [facility]

## 2020-08-14 ENCOUNTER — Other Ambulatory Visit: Payer: Self-pay

## 2020-08-17 ENCOUNTER — Other Ambulatory Visit: Payer: Self-pay

## 2020-08-17 ENCOUNTER — Other Ambulatory Visit (HOSPITAL_COMMUNITY): Payer: Self-pay

## 2020-08-18 MED FILL — Dulaglutide Soln Auto-injector 4.5 MG/0.5ML: SUBCUTANEOUS | 28 days supply | Qty: 2 | Fill #3 | Status: AC

## 2020-08-20 ENCOUNTER — Other Ambulatory Visit: Payer: Self-pay

## 2020-08-28 ENCOUNTER — Other Ambulatory Visit (HOSPITAL_COMMUNITY): Payer: Self-pay

## 2020-08-28 NOTE — Progress Notes (Signed)
Patient: Tracey Tucker, Female    DOB: 01/20/1956, 65 y.o.   MRN: [id number]  Visit Date: 08/29/2020  Today's Provider: Ruel Favors, MD   Wellness Exam  Subjective:    Patient is here for her annual exam    Review of Systems  Constitutional: Negative for fever or weight change.  Respiratory: Negative for cough and shortness of breath.   Cardiovascular: Negative for chest pain or palpitations.  Gastrointestinal: Negative for abdominal pain, no bowel changes.  Musculoskeletal: Negative for gait problem or joint swelling.  Skin: Negative for rash.  Neurological: Negative for dizziness or headache.  No other specific complaints in a complete review of systems (except as listed in HPI above).  Past Medical History:  Diagnosis Date   Diabetes mellitus without complication (HCC)    Hypercholesteremia    Hypertension    Osteoporosis    Seasonal allergies    Wears dentures    full upper, partial lower    Past Surgical History:  Procedure Laterality Date   CATARACT EXTRACTION Left 05/17/14   [facility] [place]   CATARACT EXTRACTION W/PHACO Right 07/26/2014   Procedure: CATARACT EXTRACTION PHACO AND INTRAOCULAR LENS PLACEMENT (IOC);  Surgeon: Lockie Mola, MD;  [facility];  Service: Ophthalmology;  Laterality: Right;  DIABETIC   CESAREAN SECTION     FOOT SURGERY  2014    Family History  Problem Relation Age of Onset   Diabetes Father    Heart disease Father    Diabetes Sister    Hyperlipidemia Sister    Hypertension Sister    Hypertension Brother    Diabetes Brother    Diabetes Sister    Kidney disease Sister    Heart attack Sister    Diabetes Sister    Diabetes Sister    Diabetes Brother    Stomach cancer Brother    Stroke Brother    Breast cancer Neg Hx     Social History   Socioeconomic History   Marital status: Single    Spouse name: Not on file   Number of children: Not on file   Years of education: Not on file   Highest  education level: Not on file  Occupational History   Not on file  Tobacco Use   Smoking status: Every Day    Packs/day: 1.00    Years: 29.00    Pack years: 29.00    Types: Cigarettes   Smokeless tobacco: Current    Types: Snuff   Tobacco comments:    down to 8 cig per day   Vaping Use   Vaping Use: Never used  Substance and Sexual Activity   Alcohol use: No    Alcohol/week: 0.0 standard drinks   Drug use: No   Sexual activity: Not Currently  Other Topics Concern   Not on file  Social History Narrative   Still working full time, plans on retiring in 2023 when grand-daughter graduates from [employer]    She lives with sister , one nephew and one niece    Social Determinants of Corporate investment banker Strain: Low Risk    Difficulty of Paying Living Expenses: Not hard at all  Food Insecurity: No Food Insecurity   Worried About Programme researcher, broadcasting/film/video in the Last Year: Never true   Barista in the Last Year: Never true  Transportation Needs: No Transportation Needs   Lack of Transportation (Medical): No   Lack of Transportation (Non-Medical): No  Physical Activity:  Sufficiently Active   Days of Exercise per Week: 3 days   Minutes of Exercise per Session: 60 min  Stress: No Stress Concern Present   Feeling of Stress : Not at all  Social Connections: Moderately Integrated   Frequency of Communication with Friends and Family: More than three times a week   Frequency of Social Gatherings with Friends and Family: More than three times a week   Attends Religious Services: More than 4 times per year   Active Member of [employer] or Organizations: Yes   Attends Engineer, structural: More than 4 times per year   Marital Status: Never married  Intimate Partner Violence: Not At Risk   Fear of Current or Ex-Partner: No   Emotionally Abused: No   Physically Abused: No   Sexually Abused: No    Outpatient Encounter Medications as of 08/29/2020  Medication Sig   aspirin 81  MG tablet Take 81 mg by mouth daily. PM   Blood Glucose Monitoring Suppl (FREESTYLE LITE) DEVI USE AS DIRECTED   Cholecalciferol (VITAMIN D) 2000 units CAPS Take 1 capsule (2,000 Units total) by mouth daily.   Dapagliflozin-metFORMIN HCl ER (XIGDUO XR) 11-998 MG TB24 TAKE 1 TABLET BY MOUTH DAILY. IN PLACE OF METFORMIN AND GLYBURIDE   FREESTYLE LITE test strip CHECK SUGARS ONCE A DAY   glipiZIDE (GLUCOTROL XL) 10 MG 24 hr tablet Take 1 tablet by mouth daily with breakfast.   meloxicam (MOBIC) 15 MG tablet Take 1 tablet every day by oral route.   pioglitazone (ACTOS) 15 MG tablet Take 1 tablet by mouth daily.   propranolol (INDERAL) 10 MG tablet TAKE 1 TABLET BY MOUTH 2 (TWO) TIMES DAILY.   simvastatin (ZOCOR) 40 MG tablet TAKE 1 TABLET (40 MG TOTAL) BY MOUTH AT BEDTIME.   tiotropium (SPIRIVA) 18 MCG inhalation capsule PLACE 1 CAPSULE INTO INHALER AND INHALE BY MOUTH DAILY   TRULICITY 4.5 MG/0.5ML SOPN INJECT 0.5 MLS (4.5 MG TOTAL) SUBCUTANEOUSLY EVERY SUNDAY   valsartan-hydrochlorothiazide (DIOVAN-HCT) 160-25 MG tablet TAKE 1 TABLET BY MOUTH DAILY IN PLACE OF BUMEX AND BENAZEPRIL   No facility-administered encounter medications on file as of 08/29/2020.    No Known Allergies  Care Team Updated in EHR: Yes She does not see any other providers   Last Vision Exam: 2022 Wears corrective lenses: Yes Last Dental Exam: dentures, she checks her mouth on a regular basis  Last Hearing Exam: today Wears Hearing Aids: No   Functional Ability / Safety Screening 1.  Was the timed Get Up and Go test shorter than 30 seconds?  yes 2.  Does the patient need help with the phone, transportation, shopping,      preparing meals, housework, laundry, medications, or managing money?  no 3.  Is the patient's home free of loose throw rugs in walkways, pet beds, electrical cords, etc?   yes      Grab bars in the bathroom? yes      Handrails on the stairs?   no      Adequate lighting?   yes 4.  Has the patient  noticed any hearing difficulties?   no  Diet Recall and Exercise Regimen: doing well, balanced diet   Advanced Care Planning: A voluntary discussion about advance care planning including the explanation and discussion of advance directives.  Discussed health care proxy and Living will, and the patient was able to identify a health care proxy as daughter .   Does patient have a HCPOA?  no Does patient have a living will or MOST form?  no  Cancer Screenings: Skin: discussed atypical lesions  Lung:  Low Dose CT Chest recommended if Age 73-80 years, 30 pack-year currently smoking OR have quit w/in 15years. Patient does qualify, but she is not interested  Breast:  Up to date on Mammogram? Yes  Up to date of Bone Density/Dexa? Yes Colon: due for repeat cologuard   Additional Screenings:  Hepatitis B/HIV/Syphillis:not interested  Hepatitis C Screening: up to date  Intimate Partner Violence: negative screen   Objective:   Vitals: BP 112/72   Pulse 70   Temp 98.1 F (36.7 C) (Oral)   Resp 16   Ht 4\' 11"  (1.499 m)   Wt 175 lb (79.4 kg)   BMI 35.35 kg/m  Body mass index is 35.35 kg/m.  Hearing Screening   500Hz  1000Hz  2000Hz  4000Hz   Right ear Pass Pass Pass Pass  Left ear Pass Pass Pass Pass   Vision Screening   Right eye Left eye Both eyes  Without correction     With correction 20/50 20/20 20/20     Physical Exam  Constitutional: Patient appears well-developed and well-nourished. No distress.  HENT: Head: Normocephalic and atraumatic. Ears: B TMs ok, no erythema or effusion; Nose: Nose normal. Mouth/Throat: Oropharynx is clear and moist. No oropharyngeal exudate.  Eyes: Conjunctivae and EOM are normal. Pupils are equal, round, and reactive to light. No scleral icterus.  Neck: Normal range of motion. Neck supple. No JVD present. No thyromegaly present.  Cardiovascular: Normal rate, regular rhythm and normal heart sounds.  No murmur heard. No BLE edema. Pulmonary/Chest:  Effort normal and breath sounds normal. No respiratory distress. Abdominal: Soft. Bowel sounds are normal, no distension. There is no tenderness. no masses Breast: no lumps or masses, no nipple discharge or rashes FEMALE GENITALIA:  She wants to hold off on getting it done until next year  RECTAL: no t done  Musculoskeletal: Normal range of motion, no joint effusions. No gross deformities Neurological: he is alert and oriented to person, place, and time. No cranial nerve deficit. Coordination, balance, strength, speech and gait are normal.  Skin: Skin is warm and dry. No rash noted. No erythema.  Psychiatric: Patient has a normal mood and affect. behavior is normal. Judgment and thought content normal.   Cognitive Testing - 6-CIT  Correct? Score   What year is it? yes 0 Yes = 0    No = 4  What month is it? yes 0 Yes = 0    No = 3  Remember:     , [address],     What time is it? yes 0 Yes = 0    No = 3  Count backwards from 20 to 1 yes 0 Correct = 0    1 error = 2   More than 1 error = 4  Say the months of the year in reverse. yes 0 Correct = 0    1 error = 2   More than 1 error = 4  What address did I ask you to remember? yes 0 Correct = 0  1 error = 2    2 error = 4    3 error = 6    4 error = 8    All wrong = 10       TOTAL SCORE  0/28   Interpretation:  Normal  Normal (0-7) Abnormal (8-28)   Fall Risk: Fall Risk  08/29/2020 06/29/2020 06/06/2020 02/15/2020 11/07/2019  Falls in the past year? 0 0 0 0 0  Number falls in past yr: 0 0 0 0 0  Injury with Fall? 0 0 0 0 0  Follow up - - - Falls evaluation completed -    Depression Screen [facility] 08/29/2020 06/29/2020 06/06/2020 02/15/2020 11/07/2019  Decreased Interest 0 0 0 0 0  Down, Depressed, Hopeless 0 0 0 0 0  PHQ - 2 Score 0 0 0 0 0  Altered sleeping - - 0 - -  Tired, decreased energy - - 0 - -  Change in appetite - - 0 - -  Feeling bad or failure about yourself  - - 0 - -  Trouble  concentrating - - 0 - -  Moving slowly or fidgety/restless - - 0 - -  Suicidal thoughts - - 0 - -  PHQ-9 Score - - 0 - -  Difficult doing work/chores - - Not difficult at all - -  Some recent data might be hidden    Recent Results (from the past 2160 hour(s))  POCT HgB A1C     Status: Abnormal   Collection Time: 06/29/20  9:47 AM  Result Value Ref Range   Hemoglobin A1C 6.8 (A) 4.0 - 5.6 %   HbA1c POC (<> result, manual entry)     HbA1c, POC (prediabetic range)     HbA1c, POC (controlled diabetic range)      Assessment & Plan:    1. Well adult exam  - DG Bone Density; Future  2. Need for shingles vaccine  - Varicella-zoster vaccine IM (Shingrix)  3. Colon cancer screening  - Cologuard  4. Osteopenia, unspecified location  - DG Bone Density; Future  5. Encounter for screening mammogram for malignant neoplasm of breast  - MM Digital Screening;    Immunization History  Administered Date(s) Administered   Influenza,inj,Quad PF,6+ Mos 11/07/2018   Influenza-Unspecified 10/25/2014, 11/13/2015, 10/21/2017, 11/27/2019   PFIZER(Purple Top)SARS-COV-2 Vaccination 09/02/2019, 09/23/2019   Pneumococcal Conjugate-13 11/07/2019   Pneumococcal Polysaccharide-23 12/25/2014   Tdap 11/07/2019   Zoster Recombinat (Shingrix) 06/29/2020    Health Maintenance  Topic Date Due   OPHTHALMOLOGY EXAM  04/07/2019   COVID-19 Vaccine (3 - Pfizer risk series) 10/21/2019   Zoster Vaccines- Shingrix (2 of 2) 08/24/2020   HIV Screening  08/29/2021 (Originally 10/04/1970)   INFLUENZA VACCINE  09/10/2020   Fecal DNA (Cologuard)  09/22/2020   FOOT EXAM  11/06/2020   PNA vac Low Risk Adult (2 of 2 - PPSV23) 11/06/2020   HEMOGLOBIN A1C  12/30/2020   PAP SMEAR-Modifier  03/11/2021   MAMMOGRAM  04/03/2021   TETANUS/TDAP  11/06/2029   DEXA SCAN  Completed   Hepatitis C Screening  Completed   HPV VACCINES  Aged Out    No orders of the defined types were placed in this  encounter.   Current Outpatient Medications:    aspirin 81 MG tablet, Take 81 mg by mouth daily. PM, Disp: , Rfl:    Blood Glucose Monitoring Suppl (FREESTYLE LITE) DEVI, USE AS DIRECTED, Disp: 1 each, Rfl: 0   Cholecalciferol (VITAMIN D) 2000 units CAPS, Take 1 capsule (2,000 Units total) by mouth daily., Disp: 90 capsule, Rfl: 1   Dapagliflozin-metFORMIN HCl ER (XIGDUO XR) 11-998 MG TB24, TAKE 1 TABLET BY MOUTH DAILY. IN PLACE OF METFORMIN AND GLYBURIDE, Disp: 90 tablet, Rfl: 1   FREESTYLE LITE test strip, CHECK SUGARS ONCE A DAY, Disp: 100 strip, Rfl:  2   glipiZIDE (GLUCOTROL XL) 10 MG 24 hr tablet, Take 1 tablet by mouth daily with breakfast., Disp: 90 tablet, Rfl: 0   meloxicam (MOBIC) 15 MG tablet, Take 1 tablet every day by oral route., Disp: 30 tablet, Rfl: 2   pioglitazone (ACTOS) 15 MG tablet, Take 1 tablet by mouth daily., Disp: 90 tablet, Rfl: 0   propranolol (INDERAL) 10 MG tablet, TAKE 1 TABLET BY MOUTH 2 (TWO) TIMES DAILY., Disp: 180 tablet, Rfl: 0   simvastatin (ZOCOR) 40 MG tablet, TAKE 1 TABLET (40 MG TOTAL) BY MOUTH AT BEDTIME., Disp: 90 tablet, Rfl: 1   tiotropium (SPIRIVA) 18 MCG inhalation capsule, PLACE 1 CAPSULE INTO INHALER AND INHALE BY MOUTH DAILY, Disp: 30 capsule, Rfl: 5   TRULICITY 4.5 MG/0.5ML SOPN, INJECT 0.5 MLS (4.5 MG TOTAL) SUBCUTANEOUSLY EVERY SUNDAY, Disp: 2 mL, Rfl: 11   valsartan-hydrochlorothiazide (DIOVAN-HCT) 160-25 MG tablet, TAKE 1 TABLET BY MOUTH DAILY IN PLACE OF BUMEX AND BENAZEPRIL, Disp: 90 tablet, Rfl: 0 There are no discontinued medications.

## 2020-08-29 ENCOUNTER — Ambulatory Visit (INDEPENDENT_AMBULATORY_CARE_PROVIDER_SITE_OTHER): Payer: 59 | Admitting: Family Medicine

## 2020-08-29 ENCOUNTER — Encounter: Payer: Self-pay | Admitting: Family Medicine

## 2020-08-29 ENCOUNTER — Other Ambulatory Visit: Payer: Self-pay

## 2020-08-29 VITALS — BP 112/72 | HR 70 | Temp 98.1°F | Resp 16 | Ht 59.0 in | Wt 175.0 lb

## 2020-08-29 DIAGNOSIS — Z23 Encounter for immunization: Secondary | ICD-10-CM

## 2020-08-29 DIAGNOSIS — Z1231 Encounter for screening mammogram for malignant neoplasm of breast: Secondary | ICD-10-CM

## 2020-08-29 DIAGNOSIS — E1165 Type 2 diabetes mellitus with hyperglycemia: Secondary | ICD-10-CM | POA: Diagnosis not present

## 2020-08-29 DIAGNOSIS — M7989 Other specified soft tissue disorders: Secondary | ICD-10-CM | POA: Diagnosis not present

## 2020-08-29 DIAGNOSIS — Z1211 Encounter for screening for malignant neoplasm of colon: Secondary | ICD-10-CM

## 2020-08-29 DIAGNOSIS — Z0000 Encounter for general adult medical examination without abnormal findings: Secondary | ICD-10-CM

## 2020-08-29 DIAGNOSIS — M8580 Other specified disorders of bone density and structure, unspecified site: Secondary | ICD-10-CM

## 2020-08-29 DIAGNOSIS — E1159 Type 2 diabetes mellitus with other circulatory complications: Secondary | ICD-10-CM | POA: Diagnosis not present

## 2020-08-29 DIAGNOSIS — I152 Hypertension secondary to endocrine disorders: Secondary | ICD-10-CM | POA: Diagnosis not present

## 2020-08-29 DIAGNOSIS — Z8739 Personal history of other diseases of the musculoskeletal system and connective tissue: Secondary | ICD-10-CM | POA: Diagnosis not present

## 2020-08-29 NOTE — Patient Instructions (Signed)

## 2020-08-30 LAB — CBC WITH DIFFERENTIAL/PLATELET
Absolute Monocytes: 623 cells/uL (ref 200–950)
Basophils Absolute: 30 cells/uL (ref 0–200)
Basophils Relative: 0.4 %
Eosinophils Absolute: 98 cells/uL (ref 15–500)
Eosinophils Relative: 1.3 %
HCT: 40 % (ref 35.0–45.0)
Hemoglobin: 13.1 g/dL (ref 11.7–15.5)
Lymphs Abs: 2078 cells/uL (ref 850–3900)
MCH: 28.5 pg (ref 27.0–33.0)
MCHC: 32.8 g/dL (ref 32.0–36.0)
MCV: 87 fL (ref 80.0–100.0)
MPV: 10.5 fL (ref 7.5–12.5)
Monocytes Relative: 8.3 %
Neutro Abs: 4673 cells/uL (ref 1500–7800)
Neutrophils Relative %: 62.3 %
Platelets: 238 10*3/uL (ref 140–400)
RBC: 4.6 10*6/uL (ref 3.80–5.10)
RDW: 13.6 % (ref 11.0–15.0)
Total Lymphocyte: 27.7 %
WBC: 7.5 10*3/uL (ref 3.8–10.8)

## 2020-08-30 LAB — URIC ACID: Uric Acid, Serum: 5.6 mg/dL (ref 2.5–7.0)

## 2020-08-30 LAB — COMPLETE METABOLIC PANEL WITH GFR
AG Ratio: 1.5 (calc) (ref 1.0–2.5)
ALT: 9 U/L (ref 6–29)
AST: 10 U/L (ref 10–35)
Albumin: 4 g/dL (ref 3.6–5.1)
Alkaline phosphatase (APISO): 53 U/L (ref 37–153)
BUN/Creatinine Ratio: 27 (calc) — ABNORMAL HIGH (ref 6–22)
BUN: 34 mg/dL — ABNORMAL HIGH (ref 7–25)
CO2: 27 mmol/L (ref 20–32)
Calcium: 9.5 mg/dL (ref 8.6–10.4)
Chloride: 100 mmol/L (ref 98–110)
Creat: 1.28 mg/dL — ABNORMAL HIGH (ref 0.50–1.05)
Globulin: 2.6 g/dL (calc) (ref 1.9–3.7)
Glucose, Bld: 141 mg/dL — ABNORMAL HIGH (ref 65–99)
Potassium: 4 mmol/L (ref 3.5–5.3)
Sodium: 136 mmol/L (ref 135–146)
Total Bilirubin: 0.2 mg/dL (ref 0.2–1.2)
Total Protein: 6.6 g/dL (ref 6.1–8.1)
eGFR: 46 mL/min/{1.73_m2} — ABNORMAL LOW (ref >=60)

## 2020-08-30 LAB — ANA: Anti Nuclear Antibody (ANA): NEGATIVE

## 2020-08-30 LAB — C-REACTIVE PROTEIN: CRP: 9.7 mg/L — ABNORMAL HIGH (ref <8.0)

## 2020-08-30 LAB — RHEUMATOID FACTOR: Rheumatoid fact SerPl-aCnc: <14 IU/mL (ref <14)

## 2020-08-30 LAB — SEDIMENTATION RATE: Sed Rate: 14 mm/h (ref 0–30)

## 2020-09-05 DIAGNOSIS — E119 Type 2 diabetes mellitus without complications: Secondary | ICD-10-CM | POA: Diagnosis not present

## 2020-09-05 LAB — HM DIABETES EYE EXAM

## 2020-09-12 ENCOUNTER — Other Ambulatory Visit: Payer: Self-pay | Admitting: Family Medicine

## 2020-09-12 DIAGNOSIS — I10 Essential (primary) hypertension: Secondary | ICD-10-CM

## 2020-09-12 MED ORDER — PROPRANOLOL HCL 10 MG PO TABS
ORAL_TABLET | Freq: Two times a day (BID) | ORAL | 0 refills | Status: DC
  Filled 2020-09-12: qty 180, 90d supply, fill #0

## 2020-09-12 MED FILL — Tiotropium Bromide Monohydrate Inhal Cap 18 MCG (Base Equiv): RESPIRATORY_TRACT | 30 days supply | Qty: 30 | Fill #1 | Status: AC

## 2020-09-12 NOTE — Telephone Encounter (Signed)
Requested Prescriptions  Pending Prescriptions Disp Refills  . propranolol (INDERAL) 10 MG tablet 180 tablet 0    Sig: TAKE 1 TABLET BY MOUTH 2 (TWO) TIMES DAILY.     Cardiovascular:  Beta Blockers Passed - 09/12/2020  9:18 PM      Passed - Last BP in normal range    BP Readings from Last 1 Encounters:  08/29/20 112/72         Passed - Last Heart Rate in normal range    Pulse Readings from Last 1 Encounters:  08/29/20 70         Passed - Valid encounter within last 6 months    Recent Outpatient Visits          2 weeks ago Well adult exam   [facility], Danna Hefty, MD   2 months ago Diabetes mellitus type 2 in obese [facility])   [facility] Alba Cory, MD   3 months ago Swelling of left foot   [facility] [place], Sheliah Mends, PA-C   7 months ago Diabetes mellitus type 2 in obese [facility])   [facility] Alba Cory, MD   10 months ago Diabetes mellitus type 2 in obese [facility])   [facility] Alba Cory, MD      Future Appointments            In 2 weeks Alba Cory, MD [facility]

## 2020-09-13 ENCOUNTER — Other Ambulatory Visit: Payer: Self-pay

## 2020-09-17 ENCOUNTER — Other Ambulatory Visit: Payer: Self-pay

## 2020-09-17 MED FILL — Dulaglutide Soln Auto-injector 4.5 MG/0.5ML: SUBCUTANEOUS | 28 days supply | Qty: 2 | Fill #4 | Status: AC

## 2020-09-27 NOTE — Progress Notes (Signed)
Name: Tracey Tucker   MRN: [id number]    DOB: 1955/06/10   Date:10/01/2020       Progress Note  Subjective  Chief Complaint  Follow Up  HPI  DMII: She was doing well through 2020 and early 2021,however went twice above 9 %, we referred her to Endo, dose of Trulicity was adjusted to 4.5 mg, Xigduo XR and Glipizide 10 mg also on Actos and she resumed a diabetic diet, today A1C is 7.2%.  She states fasting level 112-197, most of the time it is 112-130.  She says she has been drinking a cup of Mtn Dew every morning.  She is going to cut out the [employer].  She denies polyphagia, polydipsia and polyuria. She has dyslipidemia , She is on simvastatin and ARB.     HTN: she has hypertension, compliant with medication, Denies any episodes of chest pain,  palpitation or SOB.  Denies dizziness.  Does not monitor at home, b/p today is 118/72.  Changed valsartan-HCTZ dose from 160-$RemoveBefor'25mg'bFmCeNhyghvo$  to 160-12.$RemoveBe'5mg'SPaEuxXpU$ .   Dyslipidemia: she is on statin therapy, last LDL was 92 in 02/2020, she is taking Simvastatin daily.  Will change simvastatin to rosuvastatin.    Obesity: BMI is now 34.28, she has been more compliant with her diet, she has been taking Trulicity that is also helping with weight loss. She has lost 6 lbs since July. She reports she is eating smaller portions.  She walks about 2 miles every day.   Chronic bronchitis: she has been smoking for 30 years, she states cough no longer daily, she has been trying to quit smoking down to 8 cigarettes per day but still dip snuff.  She says that she has not had any episodes of wheezing or sob for about two years. She currently takes Spiriva and it works well for her . Not interested in lung cancer screen at this time.   Patient Active Problem List   Diagnosis Date Noted   Tobacco abuse counseling 10/22/2017   Morbid obesity ([place]) 10/22/2017   Arthritis of left knee 09/18/2015   Type 2 diabetes mellitus with hyperglycemia, without long-term current use of insulin ([place])  08/22/2014   Hypertension associated with diabetes ([place]) 08/22/2014   Hyperlipidemia associated with type 2 diabetes mellitus ([place]) 08/22/2014   Obesity (BMI 35.0-39.9 without comorbidity) 08/22/2014    Past Surgical History:  Procedure Laterality Date   CATARACT EXTRACTION Left 05/17/14   [facility] - [place]   CATARACT EXTRACTION W/PHACO Right 07/26/2014   Procedure: CATARACT EXTRACTION PHACO AND INTRAOCULAR LENS PLACEMENT ([place]);  Surgeon: Leandrew Koyanagi, MD;  Location: [facility];  Service: Ophthalmology;  Laterality: Right;  DIABETIC   CESAREAN SECTION     FOOT SURGERY  2014    Family History  Problem Relation Age of Onset   Diabetes Father    Heart disease Father    Diabetes Sister    Hyperlipidemia Sister    Hypertension Sister    Hypertension Brother    Diabetes Brother    Diabetes Sister    Kidney disease Sister    Heart attack Sister    Diabetes Sister    Diabetes Sister    Diabetes Brother    Stomach cancer Brother    Stroke Brother    Breast cancer Neg Hx     Social History   Tobacco Use   Smoking status: Every Day    Packs/day: 1.00    Years: 29.00    Pack years: 29.00    Types:  Cigarettes   Smokeless tobacco: Current    Types: Snuff   Tobacco comments:    down to 8 cig per day   Substance Use Topics   Alcohol use: No    Alcohol/week: 0.0 standard drinks     Current Outpatient Medications:    aspirin 81 MG tablet, Take 81 mg by mouth daily. PM, Disp: , Rfl:    Blood Glucose Monitoring Suppl (FREESTYLE LITE) DEVI, USE AS DIRECTED, Disp: 1 each, Rfl: 0   Cholecalciferol (VITAMIN D) 2000 units CAPS, Take 1 capsule (2,000 Units total) by mouth daily., Disp: 90 capsule, Rfl: 1   Dapagliflozin-metFORMIN HCl ER (XIGDUO XR) 11-998 MG TB24, TAKE 1 TABLET BY MOUTH DAILY. IN PLACE OF METFORMIN AND GLYBURIDE, Disp: 90 tablet, Rfl: 1   FREESTYLE LITE test strip, CHECK SUGARS ONCE A DAY, Disp: 100 strip, Rfl: 2   meloxicam (MOBIC) 15 MG tablet, Take  1 tablet every day by oral route., Disp: 30 tablet, Rfl: 2   pioglitazone (ACTOS) 15 MG tablet, Take 1 tablet by mouth daily., Disp: 90 tablet, Rfl: 0   propranolol (INDERAL) 10 MG tablet, TAKE 1 TABLET BY MOUTH 2 (TWO) TIMES DAILY., Disp: 180 tablet, Rfl: 0   rosuvastatin (CRESTOR) 20 MG tablet, Take 1 tablet (20 mg total) by mouth daily. In place of simvastatin, Disp: 90 tablet, Rfl: 3   tiotropium (SPIRIVA) 18 MCG inhalation capsule, PLACE 1 CAPSULE INTO INHALER AND INHALE BY MOUTH DAILY, Disp: 30 capsule, Rfl: 5   valsartan-hydrochlorothiazide (DIOVAN-HCT) 160-12.5 MG tablet, Take 1 tablet by mouth daily. Dose change from 160-12.5mg  to 160-12.5mg , Disp: 90 tablet, Rfl: 3   glipiZIDE (GLUCOTROL XL) 10 MG 24 hr tablet, Take 1 tablet by mouth daily with breakfast., Disp: 90 tablet, Rfl: 0   TRULICITY 4.5 OV/7.8HY SOPN, INJECT 0.5 MLS (4.5 MG TOTAL) SUBCUTANEOUSLY EVERY SUNDAY, Disp: 2 mL, Rfl: 11  No Known Allergies  I personally reviewed active problem list, medication list, allergies, family history, social history, health maintenance, notes from last encounter with the patient/caregiver today.   ROS  Constitutional: Negative for fever or weight change.  Respiratory: Negative for cough and shortness of breath.   Cardiovascular: Negative for chest pain or palpitations.  Gastrointestinal: Negative for abdominal pain, no bowel changes.  Musculoskeletal: Negative for gait problem or joint swelling.  Skin: Negative for rash.  Neurological: Negative for dizziness or headache.  No other specific complaints in a complete review of systems (except as listed in HPI above).   Objective  Vitals:   10/01/20 1126  BP: 118/72  Pulse: 88  Resp: 16  Temp: 98.2 F (36.8 C)  TempSrc: Oral  SpO2: 99%  Weight: 169 lb 11.2 oz (77 kg)  Height: 4\' 11"  (1.499 m)    Body mass index is 34.28 kg/m.  Physical Exam  Constitutional: Patient appears well-developed and well-nourished. Obese No  distress.  HEENT: head atraumatic, normocephalic, pupils equal and reactive to light, ears , neck supple Cardiovascular: Normal rate, regular rhythm and normal heart sounds.  No murmur heard. No BLE edema. Pulmonary/Chest: Effort normal and breath sounds normal. No respiratory distress. Abdominal: Soft.  There is no tenderness. Psychiatric: Patient has a normal mood and affect. behavior is normal. Judgment and thought content normal.   Recent Results (from the past 2160 hour(s))  CBC with Differential/Platelet     Status: None   Collection Time: 08/29/20  2:50 PM  Result Value Ref Range   WBC 7.5 3.8 - 10.8 Thousand/uL  RBC 4.60 3.80 - 5.10 Million/uL   Hemoglobin 13.1 11.7 - 15.5 g/dL   HCT 40.0 35.0 - 45.0 %   MCV 87.0 80.0 - 100.0 fL   MCH 28.5 27.0 - 33.0 pg   MCHC 32.8 32.0 - 36.0 g/dL   RDW 13.6 11.0 - 15.0 %   Platelets 238 140 - 400 Thousand/uL   MPV 10.5 7.5 - 12.5 fL   Neutro Abs 4,673 1,500 - 7,800 cells/uL   Lymphs Abs 2,078 850 - 3,900 cells/uL   Absolute Monocytes 623 200 - 950 cells/uL   Eosinophils Absolute 98 15 - 500 cells/uL   Basophils Absolute 30 0 - 200 cells/uL   Neutrophils Relative % 62.3 %   Total Lymphocyte 27.7 %   Monocytes Relative 8.3 %   Eosinophils Relative 1.3 %   Basophils Relative 0.4 %  COMPLETE METABOLIC PANEL WITH GFR     Status: Abnormal   Collection Time: 08/29/20  2:50 PM  Result Value Ref Range   Glucose, Bld 141 (H) 65 - 99 mg/dL    Comment: .            Fasting reference interval . For someone without known diabetes, a glucose value >125 mg/dL indicates that they may have diabetes and this should be confirmed with a follow-up test. .    BUN 34 (H) 7 - 25 mg/dL   Creat 1.28 (H) 0.50 - 1.05 mg/dL   eGFR 46 (L) > OR = 60 mL/min/1.49m2    Comment: The eGFR is based on the CKD-EPI 2021 equation. To calculate  the new eGFR from a previous Creatinine or Cystatin C result, go to  https://www.kidney.org/professionals/ kdoqi/gfr%5Fcalculator    BUN/Creatinine Ratio 27 (H) 6 - 22 (calc)   Sodium 136 135 - 146 mmol/L   Potassium 4.0 3.5 - 5.3 mmol/L   Chloride 100 98 - 110 mmol/L   CO2 27 20 - 32 mmol/L   Calcium 9.5 8.6 - 10.4 mg/dL   Total Protein 6.6 6.1 - 8.1 g/dL   Albumin 4.0 3.6 - 5.1 g/dL   Globulin 2.6 1.9 - 3.7 g/dL (calc)   AG Ratio 1.5 1.0 - 2.5 (calc)   Total Bilirubin 0.2 0.2 - 1.2 mg/dL   Alkaline phosphatase (APISO) 53 37 - 153 U/L   AST 10 10 - 35 U/L   ALT 9 6 - 29 U/L  Uric acid     Status: None   Collection Time: 08/29/20  2:50 PM  Result Value Ref Range   Uric Acid, Serum 5.6 2.5 - 7.0 mg/dL    Comment: Therapeutic target for gout patients: <6.0 mg/dL .   C-reactive protein     Status: Abnormal   Collection Time: 08/29/20  2:50 PM  Result Value Ref Range   CRP 9.7 (H) <8.0 mg/L  ANA     Status: None   Collection Time: 08/29/20  2:50 PM  Result Value Ref Range   Anti Nuclear Antibody (ANA) NEGATIVE NEGATIVE    Comment: ANA IFA is a first line screen for detecting the presence of up to approximately 150 autoantibodies in various autoimmune diseases. A negative ANA IFA result suggests an ANA-associated autoimmune disease is not present at this time, but is not definitive. If there is high clinical suspicion for Sjogren's syndrome, testing for anti-SS-A/Ro antibody should be considered. Anti-Jo-1 antibody should be considered for clinically suspected inflammatory myopathies. . AC-0: Negative . International Consensus on ANA Patterns (https://www.hernandez-brewer.com/) . For additional information, please refer to  http://education.[employer].com/faq/FAQ177 (This link is being provided for informational/ educational purposes only.) .   Rheumatoid factor     Status: None   Collection Time: 08/29/20  2:50 PM  Result Value Ref Range   Rhuematoid fact SerPl-aCnc <14 <14 IU/mL  Sedimentation rate     Status: None    Collection Time: 08/29/20  2:50 PM  Result Value Ref Range   Sed Rate 14 0 - 30 mm/h  HM DIABETES EYE EXAM     Status: None   Collection Time: 09/05/20 12:00 AM  Result Value Ref Range   HM Diabetic Eye Exam No Retinopathy No Retinopathy    Comment: [facility]  POCT HgB A1C     Status: Abnormal   Collection Time: 10/01/20 11:45 AM  Result Value Ref Range   Hemoglobin A1C 7.2 (A) 4.0 - 5.6 %   HbA1c POC (<> result, manual entry)     HbA1c, POC (prediabetic range)     HbA1c, POC (controlled diabetic range)       PHQ2/9: Depression screen [facility] 2/9 10/01/2020 08/29/2020 06/29/2020 06/06/2020 02/15/2020  Decreased Interest 0 0 0 0 0  Down, Depressed, Hopeless 0 0 0 0 0  PHQ - 2 Score 0 0 0 0 0  Altered sleeping 0 - - 0 -  Tired, decreased energy 0 - - 0 -  Change in appetite 0 - - 0 -  Feeling bad or failure about yourself  0 - - 0 -  Trouble concentrating 0 - - 0 -  Moving slowly or fidgety/restless 0 - - 0 -  Suicidal thoughts 0 - - 0 -  PHQ-9 Score 0 - - 0 -  Difficult doing work/chores Not difficult at all - - Not difficult at all -  Some recent data might be hidden    phq 9 is negative   Fall Risk: Fall Risk  10/01/2020 08/29/2020 06/29/2020 06/06/2020 02/15/2020  Falls in the past year? 0 0 0 0 0  Number falls in past yr: 0 0 0 0 0  Injury with Fall? 0 0 0 0 0  Follow up - - - - Falls evaluation completed     Functional Status Survey: Is the patient deaf or have difficulty hearing?: No Does the patient have difficulty seeing, even when wearing glasses/contacts?: No Does the patient have difficulty concentrating, remembering, or making decisions?: No Does the patient have difficulty walking or climbing stairs?: No Does the patient have difficulty dressing or bathing?: No Does the patient have difficulty doing errands alone such as visiting a doctor's office or shopping?: No    Assessment & Plan  1. Diabetes mellitus type 2 in obese (HCC)  - POCT HgB E3X - TRULICITY  4.5 VQ/0.0QQ SOPN; INJECT 0.5 MLS (4.5 MG TOTAL) SUBCUTANEOUSLY EVERY SUNDAY  Dispense: 2 mL; Refill: 11 - glipiZIDE (GLUCOTROL XL) 10 MG 24 hr tablet; Take 1 tablet by mouth daily with breakfast.  Dispense: 90 tablet; Refill: 0  She will try to change her diet and if A1C goes below 7 we can try going down on Glipizide dose  2. Essential hypertension  - valsartan-hydrochlorothiazide (DIOVAN-HCT) 160-12.5 MG tablet; Take 1 tablet by mouth daily. Dose change from 160-12.5mg  to 160-12.5mg   Dispense: 90 tablet; Refill: 3  3. Hyperlipidemia associated with type 2 diabetes mellitus (HCC)  - TRULICITY 4.5 PY/1.9JK SOPN; INJECT 0.5 MLS (4.5 MG TOTAL) SUBCUTANEOUSLY EVERY SUNDAY  Dispense: 2 mL; Refill: 11 - rosuvastatin (CRESTOR) 20 MG tablet; Take 1 tablet (20 mg  total) by mouth daily. In place of simvastatin  Dispense: 90 tablet; Refill: 3  4. Encounter for immunization  - Varicella-zoster vaccine IM (Shingrix)  5. Obesity (BMI 30.0-34.9)  BMI below 35, continue life style modifications, Truicility seems to be helping   6. Need for shingles vaccine  - Varicella-zoster vaccine IM (Shingrix)  7. Hypertension associated with diabetes ([place])   8. Simple chronic bronchitis (HCC)   I,Julie Pender NP,acting as a Education administrator for Loistine Chance, MD.,have documented all relevant documentation on the behalf of Loistine Chance, MD,as directed by  Loistine Chance, MD while in the presence of Loistine Chance, MD.

## 2020-10-01 ENCOUNTER — Other Ambulatory Visit: Payer: Self-pay

## 2020-10-01 ENCOUNTER — Ambulatory Visit (INDEPENDENT_AMBULATORY_CARE_PROVIDER_SITE_OTHER): Payer: Medicare Other | Admitting: Family Medicine

## 2020-10-01 ENCOUNTER — Encounter: Payer: Self-pay | Admitting: Family Medicine

## 2020-10-01 VITALS — BP 118/72 | HR 88 | Temp 98.2°F | Resp 16 | Ht 59.0 in | Wt 169.7 lb

## 2020-10-01 DIAGNOSIS — E1159 Type 2 diabetes mellitus with other circulatory complications: Secondary | ICD-10-CM

## 2020-10-01 DIAGNOSIS — E1169 Type 2 diabetes mellitus with other specified complication: Secondary | ICD-10-CM

## 2020-10-01 DIAGNOSIS — E669 Obesity, unspecified: Secondary | ICD-10-CM | POA: Diagnosis not present

## 2020-10-01 DIAGNOSIS — Z23 Encounter for immunization: Secondary | ICD-10-CM

## 2020-10-01 DIAGNOSIS — I10 Essential (primary) hypertension: Secondary | ICD-10-CM

## 2020-10-01 DIAGNOSIS — J410 Simple chronic bronchitis: Secondary | ICD-10-CM

## 2020-10-01 DIAGNOSIS — E785 Hyperlipidemia, unspecified: Secondary | ICD-10-CM

## 2020-10-01 DIAGNOSIS — I152 Hypertension secondary to endocrine disorders: Secondary | ICD-10-CM

## 2020-10-01 LAB — POCT GLYCOSYLATED HEMOGLOBIN (HGB A1C): Hemoglobin A1C: 7.2 % — AB (ref 4.0–5.6)

## 2020-10-01 MED ORDER — ROSUVASTATIN CALCIUM 20 MG PO TABS
20.0000 mg | ORAL_TABLET | Freq: Every day | ORAL | 3 refills | Status: DC
  Filled 2020-10-01: qty 90, 90d supply, fill #0
  Filled 2021-02-17: qty 90, 90d supply, fill #1

## 2020-10-01 MED ORDER — VALSARTAN-HYDROCHLOROTHIAZIDE 160-12.5 MG PO TABS
1.0000 | ORAL_TABLET | Freq: Every day | ORAL | 3 refills | Status: DC
  Filled 2020-10-01: qty 90, 90d supply, fill #0
  Filled 2021-01-12: qty 90, 90d supply, fill #1
  Filled 2021-04-10: qty 90, 90d supply, fill #2

## 2020-10-01 MED ORDER — TRULICITY 4.5 MG/0.5ML ~~LOC~~ SOAJ
SUBCUTANEOUS | 11 refills | Status: DC
  Filled 2020-10-01: qty 6, 84d supply, fill #0

## 2020-10-01 MED ORDER — GLIPIZIDE ER 10 MG PO TB24
ORAL_TABLET | Freq: Every day | ORAL | 0 refills | Status: DC
  Filled 2020-10-01: qty 90, fill #0
  Filled 2020-11-18: qty 90, 90d supply, fill #0

## 2020-10-02 ENCOUNTER — Other Ambulatory Visit: Payer: Self-pay

## 2020-10-03 DIAGNOSIS — Z1211 Encounter for screening for malignant neoplasm of colon: Secondary | ICD-10-CM | POA: Diagnosis not present

## 2020-10-09 ENCOUNTER — Other Ambulatory Visit: Payer: Self-pay

## 2020-10-11 LAB — COLOGUARD: Cologuard: NEGATIVE

## 2020-10-31 ENCOUNTER — Other Ambulatory Visit (HOSPITAL_COMMUNITY): Payer: Self-pay

## 2020-11-13 ENCOUNTER — Other Ambulatory Visit: Payer: Self-pay

## 2020-11-14 ENCOUNTER — Other Ambulatory Visit: Payer: Self-pay | Admitting: Family Medicine

## 2020-11-14 ENCOUNTER — Other Ambulatory Visit: Payer: Self-pay

## 2020-11-14 DIAGNOSIS — E1169 Type 2 diabetes mellitus with other specified complication: Secondary | ICD-10-CM

## 2020-11-14 MED FILL — Pioglitazone HCl Tab 15 MG (Base Equiv): ORAL | 90 days supply | Qty: 90 | Fill #0 | Status: AC

## 2020-11-15 ENCOUNTER — Other Ambulatory Visit: Payer: Self-pay

## 2020-11-19 ENCOUNTER — Other Ambulatory Visit: Payer: Self-pay

## 2020-12-07 ENCOUNTER — Other Ambulatory Visit: Payer: Self-pay

## 2020-12-07 MED FILL — Tiotropium Bromide Monohydrate Inhal Cap 18 MCG (Base Equiv): RESPIRATORY_TRACT | 30 days supply | Qty: 30 | Fill #2 | Status: AC

## 2020-12-13 ENCOUNTER — Other Ambulatory Visit: Payer: Self-pay

## 2020-12-13 ENCOUNTER — Other Ambulatory Visit: Payer: Self-pay | Admitting: Family Medicine

## 2020-12-13 DIAGNOSIS — I10 Essential (primary) hypertension: Secondary | ICD-10-CM

## 2020-12-13 MED ORDER — PROPRANOLOL HCL 10 MG PO TABS
ORAL_TABLET | Freq: Two times a day (BID) | ORAL | 1 refills | Status: DC
Start: 2020-12-13 — End: 2021-05-01
  Filled 2020-12-13: qty 180, 90d supply, fill #0
  Filled 2021-03-20: qty 180, 90d supply, fill #1

## 2020-12-13 NOTE — Telephone Encounter (Signed)
Requested Prescriptions  Pending Prescriptions Disp Refills  . propranolol (INDERAL) 10 MG tablet 180 tablet 1    Sig: TAKE 1 TABLET BY MOUTH 2 (TWO) TIMES DAILY.     Cardiovascular:  Beta Blockers Passed - 12/13/2020  6:00 AM      Passed - Last BP in normal range    BP Readings from Last 1 Encounters:  10/01/20 118/72         Passed - Last Heart Rate in normal range    Pulse Readings from Last 1 Encounters:  10/01/20 88         Passed - Valid encounter within last 6 months    Recent Outpatient Visits          2 months ago Diabetes mellitus type 2 in obese [facility])   [facility] Alba Cory, MD   3 months ago Well adult exam   [facility], Danna Hefty, MD   5 months ago Diabetes mellitus type 2 in obese [facility])   [facility] Alba Cory, MD   6 months ago Swelling of left foot   [facility] [place], Sheliah Mends, PA-C   10 months ago Diabetes mellitus type 2 in obese [facility])   [facility] Alba Cory, MD      Future Appointments            In 2 weeks Alba Cory, MD [facility], [facility]

## 2020-12-30 IMAGING — MG DIGITAL SCREENING BILAT W/ TOMO W/ CAD
6 of 10 series · 6 of 30 positions shown · non-contrast
Comparison: Previous exam(s).

CLINICAL DATA: Screening.

EXAM:
DIGITAL SCREENING BILATERAL MAMMOGRAM WITH TOMO AND CAD

[R CC synth-2D]
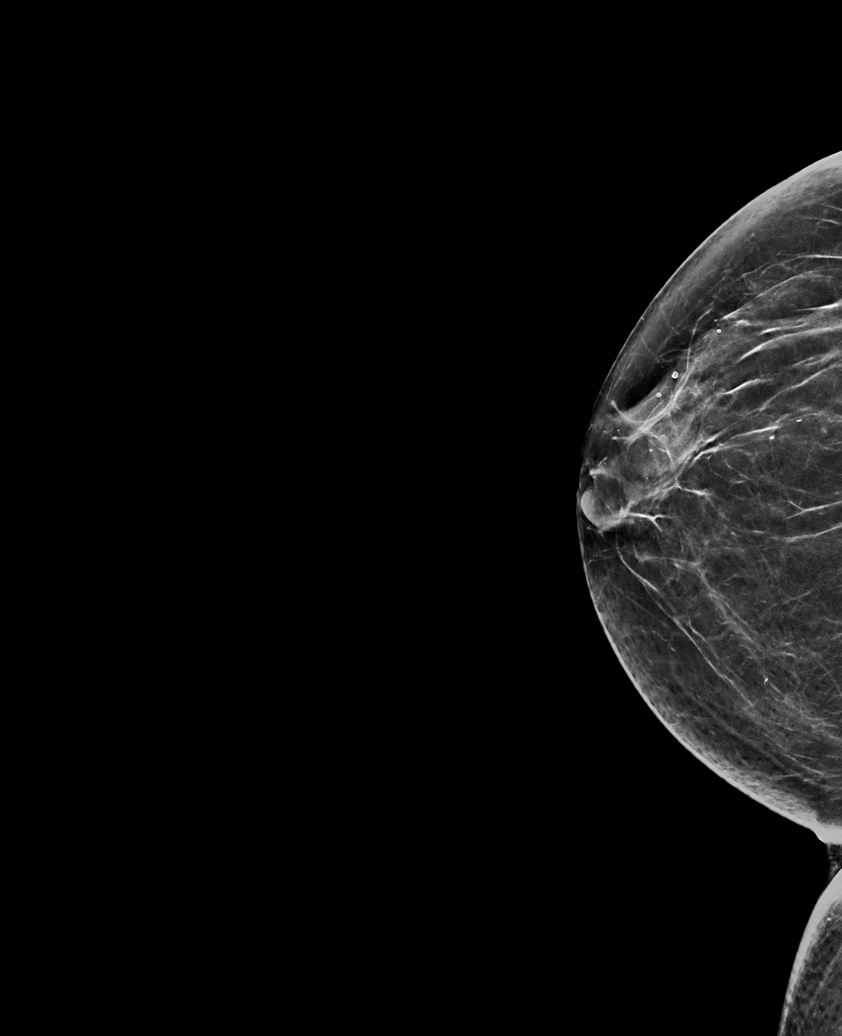

[L MLO synth-2D]
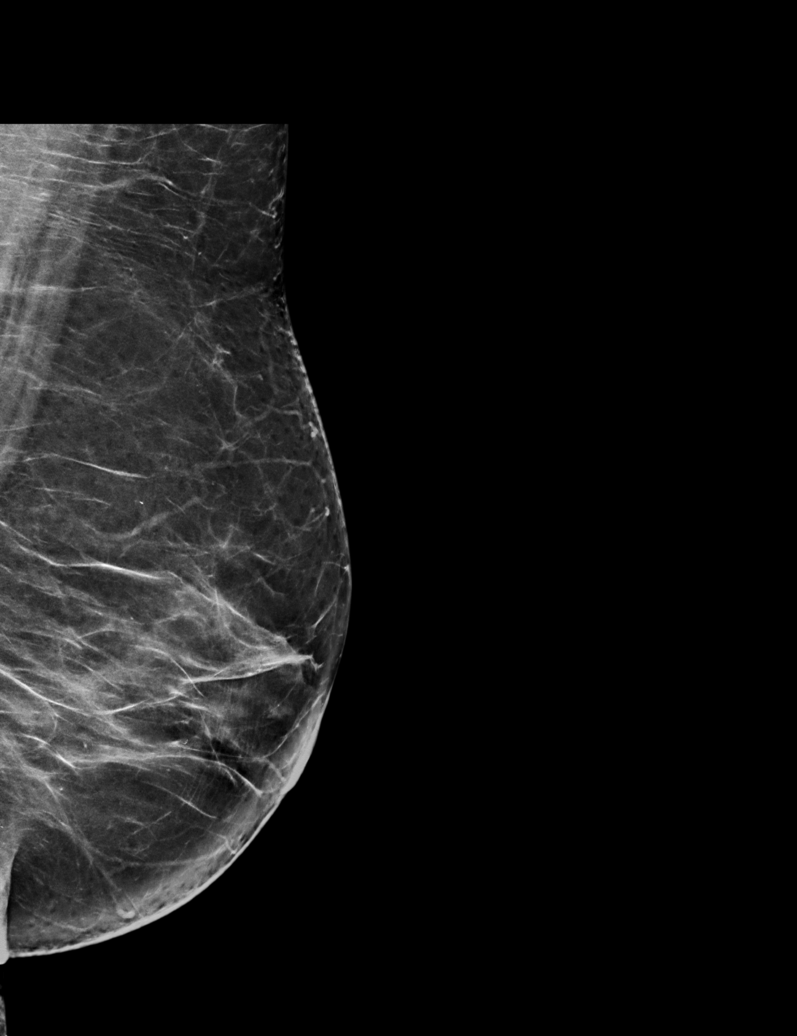

[L XCCL synth-2D]
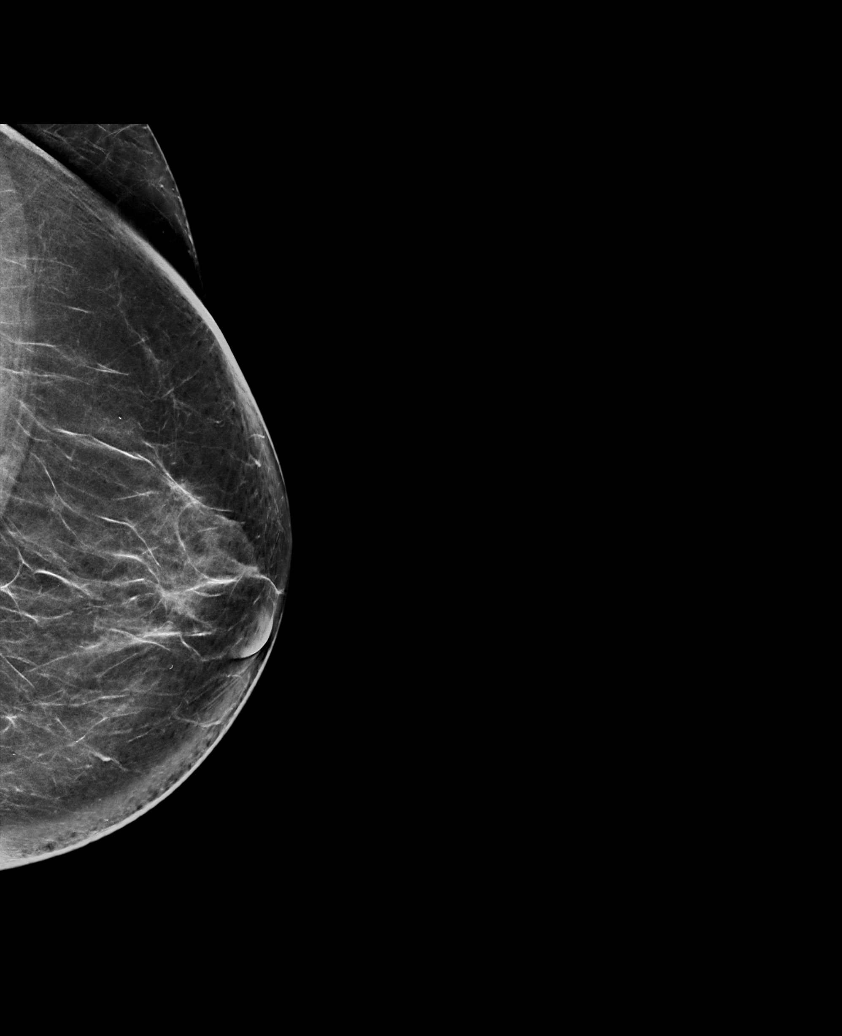

[R MLO synth-2D]
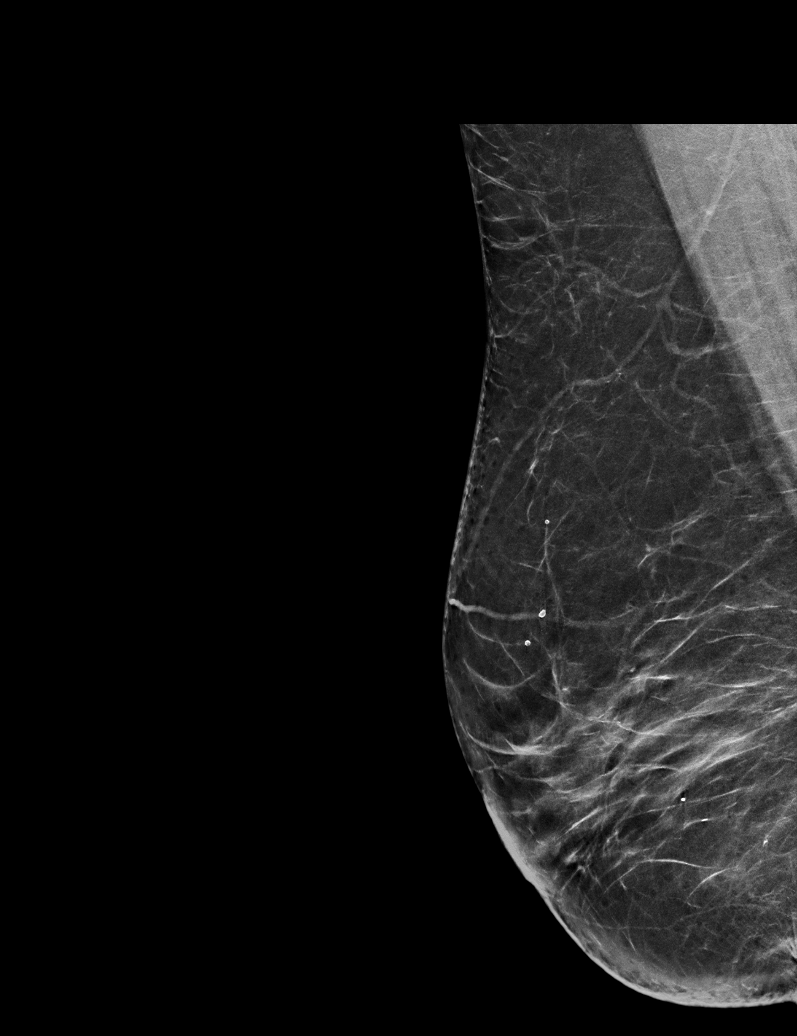

[L CC synth-2D]
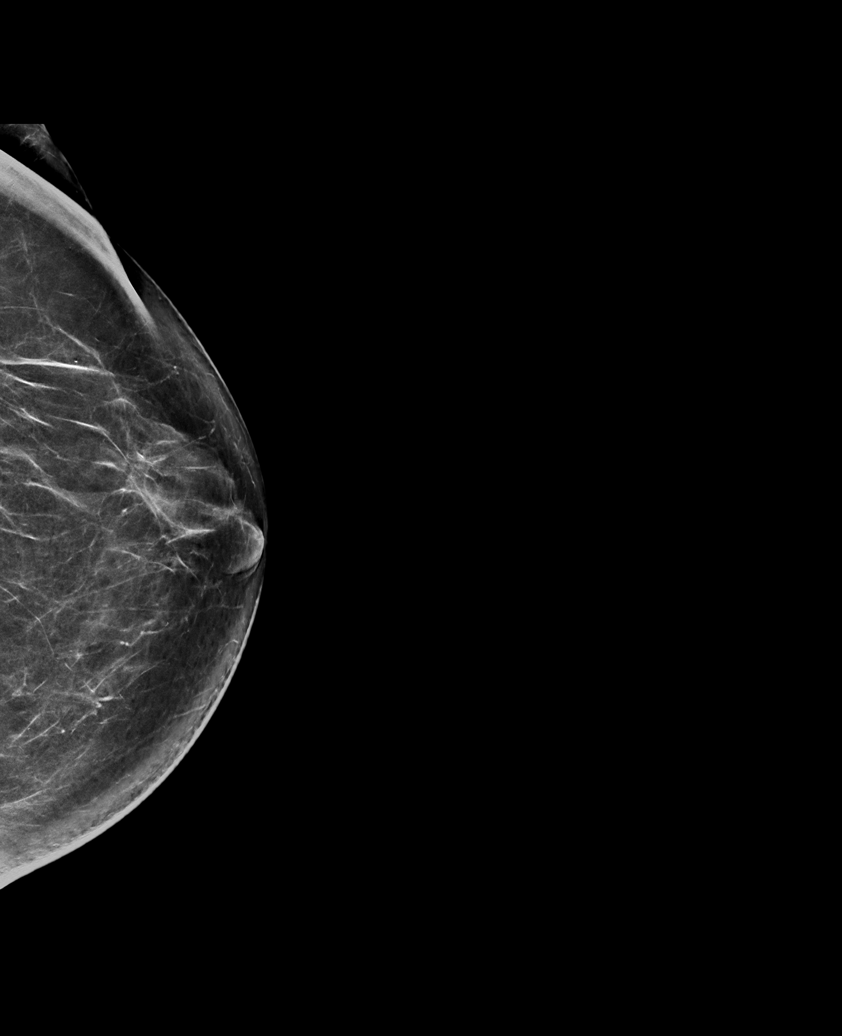

[L XCCL tomo · tomo slice 39/77.0]
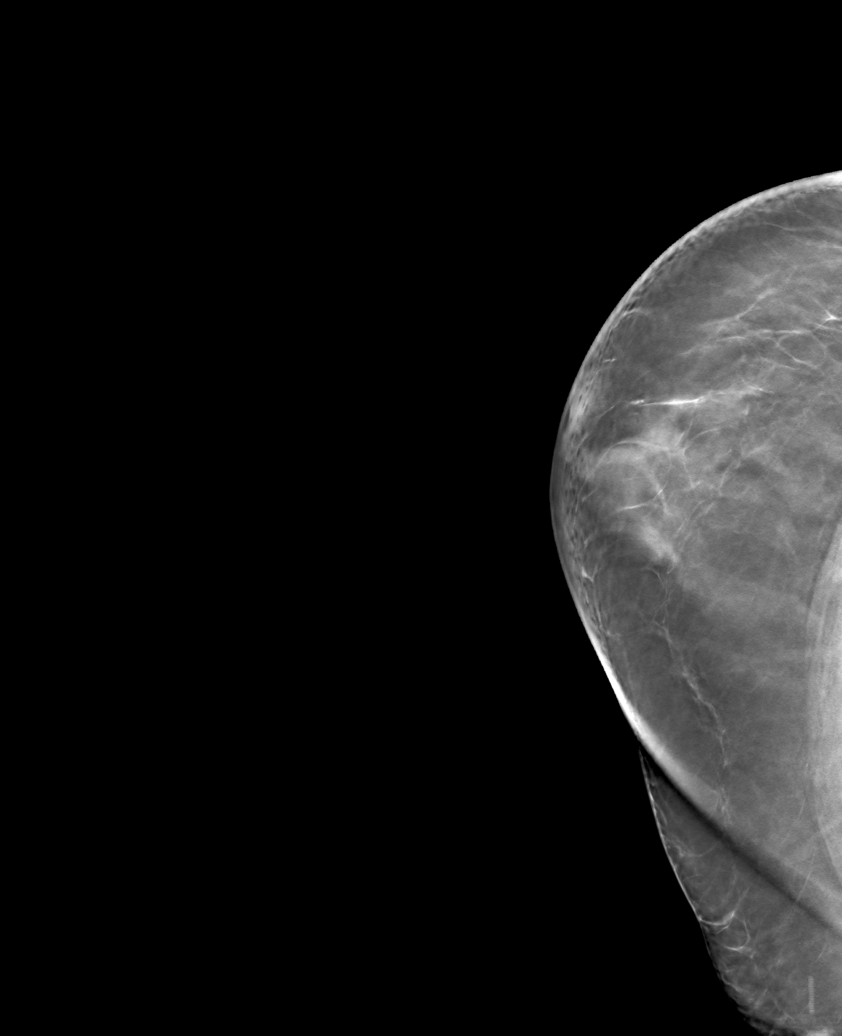

[6 of 30 positions shown; findings below may reference images not displayed]

ACR Breast Density Category b: There are scattered areas of
fibroglandular density.
FINDINGS: There are no findings suspicious for malignancy. Images were
processed with CAD.
IMPRESSION: No mammographic evidence of malignancy. A result letter of this
screening mammogram will be mailed directly to the patient.

RECOMMENDATION:
Screening mammogram in one year. (Code:[id number])

BI-RADS CATEGORY  1: Negative.

## 2021-01-01 NOTE — Progress Notes (Signed)
Name: Tracey Tucker   MRN: [id number]    DOB: 01-Aug-1955   Date:01/02/2021       Progress Note  Subjective  Chief Complaint  Follow up   HPI  DMII: She was doing well through 2020 and early 2021,however went twice above 9 %, we referred her to Endo, dose of Trulicity was adjusted to 4.5 mg, Xigduo XR and Glipizide 10 mg also on Actos and she resumed a diabetic diet, A1C down from 7.2 % to 7.1 %  She states fasting level 112-197, most of the time it is 112-130.  She says she has been drinking a cup of Mtn Dew every morning.  She is going to cut out the [employer].  She denies polyphagia, polydipsia and polyuria. She has dyslipidemia , She is on simvastatin and ARB.     HTN: she has hypertension, compliant with medication, Denies any episodes of chest pain,  palpitation or SOB.  Denies dizziness.  BP last time was low and we adjusted dose of valsartan hctz down to 160/12.5 mg and today bp is still towards low end of normal but we will continue current dose since asymptomatic, consider eliminating Hctz on her next visit or go down to half dose    Dyslipidemia: she is on statin therapy, last LDL was 92 in 02/2020, she is now on Rosuvastatin, no side effects and we will recheck labs, goal is LDL below 70    Morbid obese : BMI is over 35 , she has been more compliant with her diet, she has been taking Trulicity and initially she was losing weight but she walking 2 miles per day but since it got darker and cold and she stopped walking, advised to start walking in place inside her house    Chronic bronchitis: she has been smoking for over 30 years, she states cough no longer daily, she has been trying to quit smoking down to 8 cigarettes per day but still dip snuff.  She has been compliant with Spiriva and denies recent wheezing or coughing   CKI stage III: she stopped meloxicam a couple of months ago,  and we will recheck labs today . She has good urine output and denies pruritis   Patient Active  Problem List   Diagnosis Date Noted   Simple chronic bronchitis (HCC) 01/02/2021   Stage 3a chronic kidney disease (HCC) 01/02/2021   Vitamin D deficiency 01/02/2021   Dyslipidemia 01/02/2021   Tobacco abuse counseling 10/22/2017   Morbid obesity (HCC) 10/22/2017   Arthritis of left knee 09/18/2015   Type 2 diabetes mellitus with hyperglycemia, without long-term current use of insulin (HCC) 08/22/2014   Hypertension associated with diabetes (HCC) 08/22/2014   Hyperlipidemia associated with type 2 diabetes mellitus (HCC) 08/22/2014   Obesity (BMI 35.0-39.9 without comorbidity) 08/22/2014    Past Surgical History:  Procedure Laterality Date   CATARACT EXTRACTION Left 05/17/14   [facility] [place]   CATARACT EXTRACTION W/PHACO Right 07/26/2014   Procedure: CATARACT EXTRACTION PHACO AND INTRAOCULAR LENS PLACEMENT (IOC);  Surgeon: Lockie Mola, MD;  Location: [facility];  Service: Ophthalmology;  Laterality: Right;  DIABETIC   CESAREAN SECTION     FOOT SURGERY  2014    Family History  Problem Relation Age of Onset   Diabetes Father    Heart disease Father    Diabetes Sister    Hyperlipidemia Sister    Hypertension Sister    Hypertension Brother    Diabetes Brother    Diabetes  Sister    Kidney disease Sister    Heart attack Sister    Diabetes Sister    Diabetes Sister    Diabetes Brother    Stomach cancer Brother    Stroke Brother    Breast cancer Neg Hx     Social History   Tobacco Use   Smoking status: Every Day    Packs/day: 1.00    Years: 29.00    Pack years: 29.00    Types: Cigarettes   Smokeless tobacco: Current    Types: Snuff   Tobacco comments:    down to 8 cig per day   Substance Use Topics   Alcohol use: No    Alcohol/week: 0.0 standard drinks     Current Outpatient Medications:    aspirin 81 MG tablet, Take 81 mg by mouth daily. PM, Disp: , Rfl:    Blood Glucose Monitoring Suppl (FREESTYLE LITE) DEVI, USE AS DIRECTED, Disp: 1 each,  Rfl: 0   Cholecalciferol (VITAMIN D) 2000 units CAPS, Take 1 capsule (2,000 Units total) by mouth daily., Disp: 90 capsule, Rfl: 1   FREESTYLE LITE test strip, CHECK SUGARS ONCE A DAY, Disp: 100 strip, Rfl: 2   propranolol (INDERAL) 10 MG tablet, TAKE 1 TABLET BY MOUTH 2 (TWO) TIMES DAILY., Disp: 180 tablet, Rfl: 1   rosuvastatin (CRESTOR) 20 MG tablet, Take 1 tablet (20 mg total) by mouth daily. In place of simvastatin, Disp: 90 tablet, Rfl: 3   valsartan-hydrochlorothiazide (DIOVAN-HCT) 160-12.5 MG tablet, Take 1 tablet by mouth daily. Dose change from 160-25mg  to 160-12.5mg , Disp: 90 tablet, Rfl: 3   Dapagliflozin-metFORMIN HCl ER (XIGDUO XR) 11-998 MG TB24, TAKE 1 TABLET BY MOUTH DAILY. IN PLACE OF METFORMIN AND GLYBURIDE, Disp: 90 tablet, Rfl: 1   glipiZIDE (GLUCOTROL XL) 10 MG 24 hr tablet, Take 1 tablet by mouth daily with breakfast., Disp: 90 tablet, Rfl: 0   pioglitazone (ACTOS) 15 MG tablet, Take 1 tablet by mouth daily., Disp: 90 tablet, Rfl: 0   tiotropium (SPIRIVA) 18 MCG inhalation capsule, PLACE 1 CAPSULE INTO INHALER AND INHALE BY MOUTH DAILY, Disp: 30 capsule, Rfl: 5   TRULICITY 4.5 0000000 SOPN, INJECT 0.5 MLS (4.5 MG TOTAL) SUBCUTANEOUSLY EVERY SUNDAY, Disp: 6 mL, Rfl: 1  No Known Allergies  I personally reviewed active problem list, medication list, allergies, family history, social history, health maintenance with the patient/caregiver today.   ROS  Constitutional: Negative for fever or weight change.  Respiratory: Negative for cough and shortness of breath.   Cardiovascular: Negative for chest pain or palpitations.  Gastrointestinal: Negative for abdominal pain, no bowel changes.  Musculoskeletal: Negative for gait problem or joint swelling.  Skin: Negative for rash.  Neurological: Negative for dizziness or headache.  No other specific complaints in a complete review of systems (except as listed in HPI above).   Objective  Vitals:   01/02/21 1044  BP: 116/70   Pulse: 72  Resp: 16  Temp: 98.3 F (36.8 C)  SpO2: 96%  Weight: 174 lb (78.9 kg)  Height: 4\' 11"  (1.499 m)    Body mass index is 35.14 kg/m.  Physical Exam  Constitutional: Patient appears well-developed and well-nourished. Obese  No distress.  HEENT: head atraumatic, normocephalic, pupils equal and reactive to light, neck supple Cardiovascular: Normal rate, regular rhythm and normal heart sounds.  No murmur heard. No BLE edema. Pulmonary/Chest: Effort normal and breath sounds normal. No respiratory distress. Abdominal: Soft.  There is no tenderness. Psychiatric: Patient has a normal mood  and affect. behavior is normal. Judgment and thought content normal.   Recent Results (from the past 2160 hour(s))  POCT HgB A1C     Status: Abnormal   Collection Time: 01/02/21 10:48 AM  Result Value Ref Range   Hemoglobin A1C 7.1 (A) 4.0 - 5.6 %   HbA1c POC (<> result, manual entry)     HbA1c, POC (prediabetic range)     HbA1c, POC (controlled diabetic range)      PHQ2/9: Depression screen [facility] 2/9 01/02/2021 10/01/2020 08/29/2020 06/29/2020 06/06/2020  Decreased Interest 0 0 0 0 0  Down, Depressed, Hopeless 0 0 0 0 0  PHQ - 2 Score 0 0 0 0 0  Altered sleeping 0 0 - - 0  Tired, decreased energy 0 0 - - 0  Change in appetite 0 0 - - 0  Feeling bad or failure about yourself  0 0 - - 0  Trouble concentrating 0 0 - - 0  Moving slowly or fidgety/restless 0 0 - - 0  Suicidal thoughts 0 0 - - 0  PHQ-9 Score 0 0 - - 0  Difficult doing work/chores - Not difficult at all - - Not difficult at all  Some recent data might be hidden    phq 9 is negative  Fall Risk: Fall Risk  01/02/2021 10/01/2020 08/29/2020 06/29/2020 06/06/2020  Falls in the past year? 0 0 0 0 0  Number falls in past yr: 0 0 0 0 0  Injury with Fall? 0 0 0 0 0  Risk for fall due to : No Fall Risks - - - -  Follow up Falls prevention discussed - - - -    Functional Status Survey: Is the patient deaf or have difficulty  hearing?: No Does the patient have difficulty seeing, even when wearing glasses/contacts?: No Does the patient have difficulty concentrating, remembering, or making decisions?: No Does the patient have difficulty walking or climbing stairs?: No Does the patient have difficulty dressing or bathing?: No Does the patient have difficulty doing errands alone such as visiting a doctor's office or shopping?: No   Assessment & Plan  1. Diabetes mellitus type 2 in obese (HCC)  - Dapagliflozin-metFORMIN HCl ER (XIGDUO XR) 11-998 MG TB24; TAKE 1 TABLET BY MOUTH DAILY. IN PLACE OF METFORMIN AND GLYBURIDE  Dispense: 90 tablet; Refill: 1 - glipiZIDE (GLUCOTROL XL) 10 MG 24 hr tablet; Take 1 tablet by mouth daily with breakfast.  Dispense: 90 tablet; Refill: 0 - pioglitazone (ACTOS) 15 MG tablet; Take 1 tablet by mouth daily.  Dispense: 90 tablet; Refill: 0 - TRULICITY 4.5 0000000 SOPN; INJECT 0.5 MLS (4.5 MG TOTAL) SUBCUTANEOUSLY EVERY SUNDAY  Dispense: 6 mL; Refill: 1  2. Hyperlipidemia associated with type 2 diabetes mellitus (HCC)  - POCT HgB A1C - Lipid panel - TRULICITY 4.5 0000000 SOPN; INJECT 0.5 MLS (4.5 MG TOTAL) SUBCUTANEOUSLY EVERY SUNDAY  Dispense: 6 mL; Refill: 1  3. Morbid obesity ([place])  Discussed with the patient the risk posed by an increased BMI. Discussed importance of portion control, calorie counting and at least 150 minutes of physical activity weekly. Avoid sweet beverages and drink more water. Eat at least 6 servings of fruit and vegetables daily    4. Hypertension associated with diabetes ([place])  - COMPLETE METABOLIC PANEL WITH GFR - Urine Microalbumin w/creat. ratio  5. Vitamin D deficiency   6. Dyslipidemia  - Lipid panel  7. Essential hypertension  - COMPLETE METABOLIC PANEL WITH GFR  8. Stage 3a  chronic kidney disease (HCC)  - COMPLETE METABOLIC PANEL WITH GFR - Urine Microalbumin w/creat. ratio  9. Elevated C-reactive protein (CRP)  - C-reactive  protein  10. Simple chronic bronchitis (HCC)  - tiotropium (SPIRIVA) 18 MCG inhalation capsule; PLACE 1 CAPSULE INTO INHALER AND INHALE BY MOUTH DAILY  Dispense: 30 capsule; Refill: 5

## 2021-01-02 ENCOUNTER — Other Ambulatory Visit: Payer: Self-pay

## 2021-01-02 ENCOUNTER — Ambulatory Visit (INDEPENDENT_AMBULATORY_CARE_PROVIDER_SITE_OTHER): Payer: Medicare Other | Admitting: Family Medicine

## 2021-01-02 ENCOUNTER — Encounter: Payer: Self-pay | Admitting: Family Medicine

## 2021-01-02 VITALS — BP 116/70 | HR 72 | Temp 98.3°F | Resp 16 | Ht 59.0 in | Wt 174.0 lb

## 2021-01-02 DIAGNOSIS — E1159 Type 2 diabetes mellitus with other circulatory complications: Secondary | ICD-10-CM | POA: Diagnosis not present

## 2021-01-02 DIAGNOSIS — E669 Obesity, unspecified: Secondary | ICD-10-CM | POA: Diagnosis not present

## 2021-01-02 DIAGNOSIS — Z23 Encounter for immunization: Secondary | ICD-10-CM

## 2021-01-02 DIAGNOSIS — E1169 Type 2 diabetes mellitus with other specified complication: Secondary | ICD-10-CM | POA: Diagnosis not present

## 2021-01-02 DIAGNOSIS — I10 Essential (primary) hypertension: Secondary | ICD-10-CM

## 2021-01-02 DIAGNOSIS — E785 Hyperlipidemia, unspecified: Secondary | ICD-10-CM | POA: Diagnosis not present

## 2021-01-02 DIAGNOSIS — R7982 Elevated C-reactive protein (CRP): Secondary | ICD-10-CM

## 2021-01-02 DIAGNOSIS — J410 Simple chronic bronchitis: Secondary | ICD-10-CM | POA: Diagnosis not present

## 2021-01-02 DIAGNOSIS — I152 Hypertension secondary to endocrine disorders: Secondary | ICD-10-CM | POA: Diagnosis not present

## 2021-01-02 DIAGNOSIS — N1831 Chronic kidney disease, stage 3a: Secondary | ICD-10-CM | POA: Insufficient documentation

## 2021-01-02 DIAGNOSIS — E559 Vitamin D deficiency, unspecified: Secondary | ICD-10-CM | POA: Insufficient documentation

## 2021-01-02 LAB — POCT GLYCOSYLATED HEMOGLOBIN (HGB A1C): Hemoglobin A1C: 7.1 % — AB (ref 4.0–5.6)

## 2021-01-02 MED ORDER — TIOTROPIUM BROMIDE MONOHYDRATE 18 MCG IN CAPS
ORAL_CAPSULE | RESPIRATORY_TRACT | 5 refills | Status: DC
Start: 2021-01-02 — End: 2021-05-01
  Filled 2021-01-02: qty 30, 30d supply, fill #0

## 2021-01-02 MED ORDER — TRULICITY 4.5 MG/0.5ML ~~LOC~~ SOAJ
SUBCUTANEOUS | 1 refills | Status: DC
  Filled 2021-01-02: qty 2, 28d supply, fill #0
  Filled 2021-02-04: qty 2, 28d supply, fill #1
  Filled 2021-03-05 (×2): qty 2, 28d supply, fill #2
  Filled 2021-04-01: qty 2, 28d supply, fill #3
  Filled 2021-05-01: qty 2, 28d supply, fill #4

## 2021-01-02 MED ORDER — GLIPIZIDE ER 10 MG PO TB24
ORAL_TABLET | Freq: Every day | ORAL | 0 refills | Status: DC
  Filled 2021-01-02 – 2021-02-12 (×2): qty 90, 90d supply, fill #0

## 2021-01-02 MED ORDER — PIOGLITAZONE HCL 15 MG PO TABS
ORAL_TABLET | Freq: Every day | ORAL | 0 refills | Status: DC
  Filled 2021-01-02 – 2021-02-12 (×2): qty 90, 90d supply, fill #0

## 2021-01-02 MED ORDER — XIGDUO XR 10-1000 MG PO TB24
ORAL_TABLET | ORAL | 1 refills | Status: DC
  Filled 2021-01-02 – 2021-02-18 (×2): qty 90, 90d supply, fill #0
  Filled 2021-05-20: qty 90, 90d supply, fill #1

## 2021-01-03 LAB — COMPLETE METABOLIC PANEL WITH GFR
AG Ratio: 1.8 (calc) (ref 1.0–2.5)
ALT: 10 U/L (ref 6–29)
AST: 14 U/L (ref 10–35)
Albumin: 4.2 g/dL (ref 3.6–5.1)
Alkaline phosphatase (APISO): 53 U/L (ref 37–153)
BUN: 21 mg/dL (ref 7–25)
CO2: 26 mmol/L (ref 20–32)
Calcium: 9.7 mg/dL (ref 8.6–10.4)
Chloride: 102 mmol/L (ref 98–110)
Creat: 0.89 mg/dL (ref 0.50–1.05)
Globulin: 2.4 g/dL (calc) (ref 1.9–3.7)
Glucose, Bld: 97 mg/dL (ref 65–99)
Potassium: 4 mmol/L (ref 3.5–5.3)
Sodium: 136 mmol/L (ref 135–146)
Total Bilirubin: 0.3 mg/dL (ref 0.2–1.2)
Total Protein: 6.6 g/dL (ref 6.1–8.1)
eGFR: 71 mL/min/{1.73_m2} (ref >=60)

## 2021-01-03 LAB — MICROALBUMIN / CREATININE URINE RATIO
Creatinine, Urine: 49 mg/dL (ref 20–275)
Microalb Creat Ratio: 4 mcg/mg creat (ref <30)
Microalb, Ur: 0.2 mg/dL

## 2021-01-03 LAB — C-REACTIVE PROTEIN: CRP: 16.3 mg/L — ABNORMAL HIGH (ref <8.0)

## 2021-01-03 LAB — LIPID PANEL
Cholesterol: 203 mg/dL — ABNORMAL HIGH (ref <200)
HDL: 58 mg/dL (ref >=50)
LDL Cholesterol (Calc): 129 mg/dL (calc) — ABNORMAL HIGH
Non-HDL Cholesterol (Calc): 145 mg/dL (calc) — ABNORMAL HIGH (ref <130)
Total CHOL/HDL Ratio: 3.5 (calc) (ref <5.0)
Triglycerides: 65 mg/dL (ref <150)

## 2021-01-04 ENCOUNTER — Other Ambulatory Visit: Payer: Self-pay

## 2021-01-07 ENCOUNTER — Other Ambulatory Visit: Payer: Self-pay

## 2021-01-08 ENCOUNTER — Other Ambulatory Visit: Payer: Self-pay

## 2021-01-08 NOTE — Progress Notes (Signed)
Attempted to contact her on all 3 numbers listed with no answer/availability to leave voicemail.

## 2021-01-08 NOTE — Progress Notes (Signed)
Left voicemail, awaiting return call. 

## 2021-01-09 ENCOUNTER — Telehealth: Payer: Self-pay

## 2021-01-09 NOTE — Telephone Encounter (Signed)
Copied from CRM [id number]. Topic: General - Other >> Jan 08, 2021  4:29 PM Tracey Tucker wrote: Reason for CRM: pt states that she is returning the office call. Not showing note in chart. Showing pt has had recent labs. Please assist pt further.

## 2021-01-09 NOTE — Telephone Encounter (Signed)
Attempted to reach patient on all numbers above to discuss lab results. Left voicemails/message at work for her to call us when she gets a chance.

## 2021-01-09 NOTE — Telephone Encounter (Signed)
I have attempted to contact her multiple times on different numbers and have not been able to reach her. If she calls back please transfer her to me.

## 2021-01-14 ENCOUNTER — Other Ambulatory Visit: Payer: Self-pay

## 2021-02-04 ENCOUNTER — Other Ambulatory Visit: Payer: Self-pay

## 2021-02-05 ENCOUNTER — Other Ambulatory Visit: Payer: Self-pay

## 2021-02-12 ENCOUNTER — Other Ambulatory Visit: Payer: Self-pay

## 2021-02-18 ENCOUNTER — Other Ambulatory Visit: Payer: Self-pay

## 2021-02-19 ENCOUNTER — Other Ambulatory Visit: Payer: Self-pay

## 2021-03-05 ENCOUNTER — Other Ambulatory Visit: Payer: Self-pay

## 2021-03-20 ENCOUNTER — Other Ambulatory Visit: Payer: Self-pay

## 2021-04-02 ENCOUNTER — Other Ambulatory Visit: Payer: Self-pay

## 2021-04-10 ENCOUNTER — Other Ambulatory Visit: Payer: Self-pay

## 2021-04-30 NOTE — Progress Notes (Signed)
Name: Tracey Tucker   MRN: [id number]    DOB: November 14, 1955   Date:05/01/2021 ? ?     Progress Note ? ?Subjective ? ?Chief Complaint ? ?Follow Up ? ?HPI ? ?DMII: She is currently taking Trulicity was adjusted to 4.5 mg, Xigduo XR and Glipizide 10 mg also on Actos and she resumed a diabetic diet, A1C down from 7.2 % to 7.1 %  She states fasting level 120's-130's  She stopped drinking [address] - only drinking water at water, at home [employer]  .  She denies polyphagia, polydipsia and polyuria. She has dyslipidemia , She is on simvastatin and ARB.   ?  ?HTN:  Denies any episodes of chest pain,  palpitation or SOB.  Denies dizziness.  BP was running low and we decreased dose of diovan hctz to 160/12.5 mg and continued inderal and bp today is better at 120/76.  ?  ?Dyslipidemia: she is on statin therapy, last LDL was 92 in 02/2020, she is now on Rosuvastatin, no side effects and we will recheck labs, goal is LDL below 70  ?  ?Obesity : BMI is now below 35, she has been walking a lot, drinking water. Continue life style modification  ?  ?Chronic bronchitis: she has been smoking for over 30 years, she still has a cough but is dry, no wheezing or SOB. Sshe has been trying to quit smoking down to 4 cigarettes per day but still dip snuff.  She has been compliant with Spiriva and denies recent wheezing or coughing , she states it is expensive but does not want to change medication. ? ?CKI stage III: she stopped meloxicam a couple of months ago, last GFR improved, we will continue to monitor. She is also on ARB and drinking more water  ? ?Patient Active Problem List  ? Diagnosis Date Noted  ? Simple chronic bronchitis (HCC) 01/02/2021  ? Stage 3a chronic kidney disease (HCC) 01/02/2021  ? Vitamin D deficiency 01/02/2021  ? Dyslipidemia 01/02/2021  ? Tobacco abuse counseling 10/22/2017  ? Morbid obesity (HCC) 10/22/2017  ? Arthritis of left knee 09/18/2015  ? Type 2 diabetes mellitus with hyperglycemia, without long-term  current use of insulin (HCC) 08/22/2014  ? Hypertension associated with diabetes (HCC) 08/22/2014  ? Hyperlipidemia associated with type 2 diabetes mellitus (HCC) 08/22/2014  ? Obesity (BMI 35.0-39.9 without comorbidity) 08/22/2014  ? ? ?Past Surgical History:  ?Procedure Laterality Date  ? CATARACT EXTRACTION Left 05/17/14  ? [facility]  ? CATARACT EXTRACTION W/PHACO Right 07/26/2014  ? Procedure: CATARACT EXTRACTION PHACO AND INTRAOCULAR LENS PLACEMENT (IOC);  Surgeon: Lockie Molahadwick [place], MD;  [facility];  Service: Ophthalmology;  Laterality: Right;  DIABETIC  ? CESAREAN SECTION    ? FOOT SURGERY  2014  ? ? ?Family History  ?Problem Relation Age of Onset  ? Diabetes Father   ? Heart disease Father   ? Diabetes Sister   ? Hyperlipidemia Sister   ? Hypertension Sister   ? Hypertension Brother   ? Diabetes Brother   ? Diabetes Sister   ? Kidney disease Sister   ? Heart attack Sister   ? Diabetes Sister   ? Diabetes Sister   ? Diabetes Brother   ? Stomach cancer Brother   ? Stroke Brother   ? Breast cancer Neg Hx   ? ? ?Social History  ? ?Tobacco Use  ? Smoking status: Every Day  ?  Packs/day: 1.00  ?  Years: 29.00  ?  Pack years: 29.00  ?  Types: Cigarettes  ? Smokeless tobacco: Current  ?  Types: Snuff  ? Tobacco comments:  ?  down to 8 cig per day   ?Substance Use Topics  ? Alcohol use: No  ?  Alcohol/week: 0.0 standard drinks  ? ? ? ?Current Outpatient Medications:  ?  aspirin 81 MG tablet, Take 81 mg by mouth daily. PM, Disp: , Rfl:  ?  Blood Glucose Monitoring Suppl (FREESTYLE LITE) DEVI, USE AS DIRECTED, Disp: 1 each, Rfl: 0 ?  Cholecalciferol (VITAMIN D) 2000 units CAPS, Take 1 capsule (2,000 Units total) by mouth daily., Disp: 90 capsule, Rfl: 1 ?  Dapagliflozin-metFORMIN HCl ER (XIGDUO XR) 11-998 MG TB24, TAKE 1 TABLET BY MOUTH DAILY. IN PLACE OF METFORMIN AND GLYBURIDE, Disp: 90 tablet, Rfl: 1 ?  FREESTYLE LITE test strip, CHECK SUGARS ONCE A DAY, Disp: 100 strip, Rfl: 2 ?   glipiZIDE (GLUCOTROL XL) 10 MG 24 hr tablet, Take 1 tablet by mouth daily with breakfast., Disp: 90 tablet, Rfl: 0 ?  pioglitazone (ACTOS) 15 MG tablet, Take 1 tablet by mouth daily., Disp: 90 tablet, Rfl: 0 ?  propranolol (INDERAL) 10 MG tablet, TAKE 1 TABLET BY MOUTH 2 (TWO) TIMES DAILY., Disp: 180 tablet, Rfl: 1 ?  rosuvastatin (CRESTOR) 20 MG tablet, Take 1 tablet (20 mg total) by mouth daily. In place of simvastatin, Disp: 90 tablet, Rfl: 3 ?  tiotropium (SPIRIVA) 18 MCG inhalation capsule, PLACE 1 CAPSULE INTO INHALER AND INHALE BY MOUTH DAILY, Disp: 30 capsule, Rfl: 5 ?  TRULICITY 4.5 MG/0.5ML SOPN, INJECT 0.5 MLS (4.5 MG TOTAL) SUBCUTANEOUSLY EVERY SUNDAY, Disp: 6 mL, Rfl: 1 ?  valsartan-hydrochlorothiazide (DIOVAN-HCT) 160-12.5 MG tablet, Take 1 tablet by mouth daily. Dose change from 160-25mg  to 160-12.5mg , Disp: 90 tablet, Rfl: 3 ? ?No Known Allergies ? ?I personally reviewed active problem list, medication list, allergies, family history, social history, health maintenance with the patient/caregiver today. ? ? ?ROS ? ?Constitutional: Negative for fever or weight change.  ?Respiratory: Negative for cough and shortness of breath.   ?Cardiovascular: Negative for chest pain or palpitations.  ?Gastrointestinal: Negative for abdominal pain, no bowel changes.  ?Musculoskeletal: Negative for gait problem or joint swelling.  ?Skin: Negative for rash.  ?Neurological: Negative for dizziness or headache.  ?No other specific complaints in a complete review of systems (except as listed in HPI above).  ? ?Objective ? ?Vitals:  ? 05/01/21 1043  ?BP: 120/76  ?Pulse: 92  ?Resp: 16  ?SpO2: 100%  ?Weight: 170 lb (77.1 kg)  ?Height: 4\' 11"  (1.499 m)  ? ? ?Body mass index is 34.34 kg/m?. ? ?Physical Exam ? ?Constitutional: Patient appears well-developed and well-nourished. Obese No distress.  ?HEENT: head atraumatic, normocephalic, pupils equal and reactive to light neck supple, throat within normal limits ?Cardiovascular:  Normal rate, regular rhythm and normal heart sounds.  No murmur heard. No BLE edema. ?Pulmonary/Chest: Effort normal and breath sounds normal. No respiratory distress. ?Abdominal: Soft.  There is no tenderness. ?Psychiatric: Patient has a normal mood and affect. behavior is normal. Judgment and thought content normal.  ? ? ?PHQ2/9: ? ?  05/01/2021  ? 10:43 AM 01/02/2021  ? 10:38 AM 10/01/2020  ? 11:28 AM 08/29/2020  ?  2:06 PM 06/29/2020  ?  9:36 AM  ?Depression screen PHQ 2/9  ?Decreased Interest 0 0 0 0 0  ?Down, Depressed, Hopeless 0 0 0 0 0  ?PHQ - 2 Score 0 0 0 0 0  ?Altered  sleeping 0 0 0    ?Tired, decreased energy 0 0 0    ?Change in appetite 0 0 0    ?Feeling bad or failure about yourself  0 0 0    ?Trouble concentrating 0 0 0    ?Moving slowly or fidgety/restless 0 0 0    ?Suicidal thoughts 0 0 0    ?PHQ-9 Score 0 0 0    ?Difficult doing work/chores   Not difficult at all    ?  ?phq 9 is negative ? ? ?Fall Risk: ? ?  05/01/2021  ? 10:43 AM 01/02/2021  ? 10:38 AM 10/01/2020  ? 11:28 AM 08/29/2020  ?  2:06 PM 06/29/2020  ?  9:36 AM  ?Fall Risk   ?Falls in the past year? 0 0 0 0 0  ?Number falls in past yr: 0 0 0 0 0  ?Injury with Fall? 0 0 0 0 0  ?Risk for fall due to : No Fall Risks No Fall Risks     ?Follow up Falls prevention discussed Falls prevention discussed     ? ? ? ? ?Functional Status Survey: ?Is the patient deaf or have difficulty hearing?: No ?Does the patient have difficulty seeing, even when wearing glasses/contacts?: No ?Does the patient have difficulty concentrating, remembering, or making decisions?: No ?Does the patient have difficulty walking or climbing stairs?: No ?Does the patient have difficulty dressing or bathing?: No ?Does the patient have difficulty doing errands alone such as visiting a doctor's office or shopping?: No ? ? ? ?Assessment & Plan ? ?1. Hyperlipidemia associated with type 2 diabetes mellitus (HCC) ? ?- rosuvastatin (CRESTOR) 40 MG tablet; Take 1 tablet (40 mg total) by mouth  daily. New dose  Dispense: 90 tablet; Refill: 1 ?- TRULICITY 4.5 MG/0.5ML SOPN; INJECT 0.5 MLS (4.5 MG TOTAL) SUBCUTANEOUSLY EVERY SUNDAY  Dispense: 6 mL; Refill: 1 ?- Hemoglobin A1c ? ?2. Hypertension associated w

## 2021-05-01 ENCOUNTER — Ambulatory Visit (INDEPENDENT_AMBULATORY_CARE_PROVIDER_SITE_OTHER): Payer: 59 | Admitting: Family Medicine

## 2021-05-01 ENCOUNTER — Encounter: Payer: Self-pay | Admitting: Family Medicine

## 2021-05-01 ENCOUNTER — Other Ambulatory Visit: Payer: Self-pay

## 2021-05-01 VITALS — BP 120/76 | HR 92 | Resp 16 | Ht 59.0 in | Wt 170.0 lb

## 2021-05-01 DIAGNOSIS — Z23 Encounter for immunization: Secondary | ICD-10-CM | POA: Diagnosis not present

## 2021-05-01 DIAGNOSIS — I152 Hypertension secondary to endocrine disorders: Secondary | ICD-10-CM | POA: Diagnosis not present

## 2021-05-01 DIAGNOSIS — E669 Obesity, unspecified: Secondary | ICD-10-CM | POA: Diagnosis not present

## 2021-05-01 DIAGNOSIS — R7982 Elevated C-reactive protein (CRP): Secondary | ICD-10-CM

## 2021-05-01 DIAGNOSIS — E1159 Type 2 diabetes mellitus with other circulatory complications: Secondary | ICD-10-CM | POA: Diagnosis not present

## 2021-05-01 DIAGNOSIS — E1169 Type 2 diabetes mellitus with other specified complication: Secondary | ICD-10-CM

## 2021-05-01 DIAGNOSIS — E559 Vitamin D deficiency, unspecified: Secondary | ICD-10-CM

## 2021-05-01 DIAGNOSIS — J410 Simple chronic bronchitis: Secondary | ICD-10-CM | POA: Diagnosis not present

## 2021-05-01 DIAGNOSIS — N1831 Chronic kidney disease, stage 3a: Secondary | ICD-10-CM

## 2021-05-01 DIAGNOSIS — E785 Hyperlipidemia, unspecified: Secondary | ICD-10-CM

## 2021-05-01 DIAGNOSIS — I10 Essential (primary) hypertension: Secondary | ICD-10-CM

## 2021-05-01 MED ORDER — GLIPIZIDE ER 10 MG PO TB24
ORAL_TABLET | Freq: Every day | ORAL | 0 refills | Status: DC
  Filled 2021-05-01: qty 90, 90d supply, fill #0

## 2021-05-01 MED ORDER — PROPRANOLOL HCL 10 MG PO TABS
ORAL_TABLET | Freq: Two times a day (BID) | ORAL | 1 refills | Status: DC
  Filled 2021-05-01: qty 180, fill #0
  Filled 2021-06-21: qty 180, 90d supply, fill #0

## 2021-05-01 MED ORDER — PIOGLITAZONE HCL 15 MG PO TABS
ORAL_TABLET | Freq: Every day | ORAL | 0 refills | Status: DC
  Filled 2021-05-01: qty 90, 90d supply, fill #0

## 2021-05-01 MED ORDER — TIOTROPIUM BROMIDE MONOHYDRATE 18 MCG IN CAPS
ORAL_CAPSULE | RESPIRATORY_TRACT | 5 refills | Status: DC
  Filled 2021-05-01: qty 30, 30d supply, fill #0
  Filled 2021-10-05: qty 30, 30d supply, fill #1
  Filled 2021-12-17: qty 30, 30d supply, fill #2
  Filled 2022-02-05: qty 30, 30d supply, fill #3

## 2021-05-01 MED ORDER — TRULICITY 4.5 MG/0.5ML ~~LOC~~ SOAJ
SUBCUTANEOUS | 1 refills | Status: DC
  Filled 2021-05-01: qty 2, 28d supply, fill #0
  Filled 2021-05-27: qty 2, 28d supply, fill #1
  Filled 2021-06-23: qty 2, 28d supply, fill #2
  Filled 2021-07-22: qty 2, 28d supply, fill #3
  Filled 2021-08-18: qty 2, 28d supply, fill #4

## 2021-05-01 MED ORDER — VALSARTAN-HYDROCHLOROTHIAZIDE 160-12.5 MG PO TABS
1.0000 | ORAL_TABLET | Freq: Every day | ORAL | 3 refills | Status: DC
  Filled 2021-05-01 – 2021-07-09 (×2): qty 90, 90d supply, fill #0
  Filled 2021-10-05: qty 90, 90d supply, fill #1

## 2021-05-01 MED ORDER — ROSUVASTATIN CALCIUM 40 MG PO TABS
40.0000 mg | ORAL_TABLET | Freq: Every day | ORAL | 1 refills | Status: DC
  Filled 2021-05-01: qty 90, 90d supply, fill #0
  Filled 2021-08-18: qty 90, 90d supply, fill #1

## 2021-05-02 LAB — C-REACTIVE PROTEIN: CRP: 12.4 mg/L — ABNORMAL HIGH (ref <8.0)

## 2021-05-02 LAB — HEMOGLOBIN A1C
Hgb A1c MFr Bld: 6.9 % of total Hgb — ABNORMAL HIGH (ref <5.7)
Mean Plasma Glucose: 151 mg/dL
eAG (mmol/L): 8.4 mmol/L

## 2021-05-02 LAB — SEDIMENTATION RATE: Sed Rate: 14 mm/h (ref 0–30)

## 2021-05-21 ENCOUNTER — Other Ambulatory Visit: Payer: Self-pay

## 2021-05-27 ENCOUNTER — Other Ambulatory Visit: Payer: Self-pay

## 2021-05-27 ENCOUNTER — Other Ambulatory Visit: Payer: Self-pay | Admitting: Family Medicine

## 2021-05-27 DIAGNOSIS — Z1231 Encounter for screening mammogram for malignant neoplasm of breast: Secondary | ICD-10-CM

## 2021-06-21 ENCOUNTER — Other Ambulatory Visit: Payer: Self-pay

## 2021-06-23 ENCOUNTER — Other Ambulatory Visit: Payer: Self-pay

## 2021-06-24 ENCOUNTER — Other Ambulatory Visit: Payer: Self-pay

## 2021-07-09 ENCOUNTER — Other Ambulatory Visit: Payer: Self-pay

## 2021-07-22 ENCOUNTER — Other Ambulatory Visit: Payer: Self-pay

## 2021-07-24 ENCOUNTER — Ambulatory Visit
Admission: RE | Admit: 2021-07-24 | Discharge: 2021-07-24 | Disposition: A | Discharge status: Home / Self Care | Payer: 59 | Source: Ambulatory Visit | Attending: Family Medicine | Admitting: Family Medicine

## 2021-07-24 DIAGNOSIS — Z1231 Encounter for screening mammogram for malignant neoplasm of breast: Secondary | ICD-10-CM | POA: Diagnosis not present

## 2021-07-24 DIAGNOSIS — F17210 Nicotine dependence, cigarettes, uncomplicated: Secondary | ICD-10-CM | POA: Diagnosis not present

## 2021-07-24 DIAGNOSIS — E119 Type 2 diabetes mellitus without complications: Secondary | ICD-10-CM | POA: Insufficient documentation

## 2021-07-24 DIAGNOSIS — M8589 Other specified disorders of bone density and structure, multiple sites: Secondary | ICD-10-CM | POA: Diagnosis not present

## 2021-07-24 DIAGNOSIS — E559 Vitamin D deficiency, unspecified: Secondary | ICD-10-CM | POA: Insufficient documentation

## 2021-07-24 DIAGNOSIS — Z7985 Long-term (current) use of injectable non-insulin antidiabetic drugs: Secondary | ICD-10-CM | POA: Diagnosis not present

## 2021-07-24 DIAGNOSIS — Z780 Asymptomatic menopausal state: Secondary | ICD-10-CM | POA: Diagnosis not present

## 2021-07-24 DIAGNOSIS — Z7984 Long term (current) use of oral hypoglycemic drugs: Secondary | ICD-10-CM | POA: Insufficient documentation

## 2021-07-24 DIAGNOSIS — M8588 Other specified disorders of bone density and structure, other site: Secondary | ICD-10-CM | POA: Diagnosis not present

## 2021-07-24 DIAGNOSIS — Z79899 Other long term (current) drug therapy: Secondary | ICD-10-CM | POA: Diagnosis not present

## 2021-07-24 DIAGNOSIS — Z0000 Encounter for general adult medical examination without abnormal findings: Secondary | ICD-10-CM | POA: Diagnosis not present

## 2021-07-24 DIAGNOSIS — M8580 Other specified disorders of bone density and structure, unspecified site: Secondary | ICD-10-CM

## 2021-08-07 ENCOUNTER — Other Ambulatory Visit: Payer: Self-pay | Admitting: Family Medicine

## 2021-08-07 ENCOUNTER — Other Ambulatory Visit: Payer: Self-pay

## 2021-08-07 DIAGNOSIS — E1169 Type 2 diabetes mellitus with other specified complication: Secondary | ICD-10-CM

## 2021-08-07 MED ORDER — PIOGLITAZONE HCL 15 MG PO TABS
ORAL_TABLET | Freq: Every day | ORAL | 0 refills | Status: DC
  Filled 2021-08-07 (×2): qty 90, 90d supply, fill #0

## 2021-08-08 ENCOUNTER — Other Ambulatory Visit: Payer: Self-pay

## 2021-08-18 ENCOUNTER — Other Ambulatory Visit: Payer: Self-pay | Admitting: Family Medicine

## 2021-08-18 ENCOUNTER — Other Ambulatory Visit: Payer: Self-pay

## 2021-08-18 DIAGNOSIS — E1169 Type 2 diabetes mellitus with other specified complication: Secondary | ICD-10-CM

## 2021-08-18 MED ORDER — GLIPIZIDE ER 10 MG PO TB24
ORAL_TABLET | Freq: Every day | ORAL | 0 refills | Status: DC
  Filled 2021-08-18: qty 90, 90d supply, fill #0

## 2021-08-18 MED ORDER — XIGDUO XR 10-1000 MG PO TB24
ORAL_TABLET | ORAL | 1 refills | Status: DC
  Filled 2021-08-18: qty 90, 90d supply, fill #0
  Filled 2021-11-12: qty 30, 30d supply, fill #1
  Filled 2021-12-17: qty 30, 30d supply, fill #2

## 2021-08-19 ENCOUNTER — Other Ambulatory Visit: Payer: Self-pay

## 2021-08-20 ENCOUNTER — Other Ambulatory Visit: Payer: Self-pay

## 2021-08-30 NOTE — Progress Notes (Unsigned)
Name: Tracey Tucker   MRN: [id number]    DOB: 01/13/1956   Date:09/02/2021       Progress Note  Subjective  Chief Complaint  Follow up   HPI  DMII: She is currently taking Trulicity 4.5 mg, Xigduo XR and Glipizide 10 mg also on Actos and she resumed a diabetic diet, A1C down from 7.2 % to 7.1 %, 6.9 % and today 7 %   She states fasting level 115-130's highest level was 169  She stopped drinking [address] - only drinking water at water, at home [employer]  She denies polyphagia, polydipsia and polyuria but has nocturia - she drinks a lot of water on the way home from home She has dyslipidemia , She is on simvastatin and ARB.     HTN:  Denies any episodes of chest pain,  palpitation or SOB.  Denies dizziness.  BP is not checked at home, but we decreased dose of diovan and bp today is at goal    Dyslipidemia: she is on statin therapy, last LDL was 92 in 02/2020, she is now on Rosuvastatin, no side effects but last LDL was up to 129    Obesity : BMI is now below 35, she has been walking a lot, drinking water. Continue life style modification , but advised to become more physically active    Chronic bronchitis: she has been smoking for over 30 years, she still has a cough but is dry, no wheezing or SOB. Sshe has been trying to quit smoking down to 4 cigarettes per day but still dip snuff.  She has been taking Spiriva a few times a week  CKI stage III: she stopped meloxicam a couple of months ago, last GFR improved, we will continue to monitor. She is also on ARB and SGL-2 agonist .   Elevated CRP: she was having foot pain, but resolved now, CRP is trending down, other markers negative   Patient Active Problem List   Diagnosis Date Noted   Simple chronic bronchitis (HCC) 01/02/2021   Stage 3a chronic kidney disease (HCC) 01/02/2021   Vitamin D deficiency 01/02/2021   Dyslipidemia 01/02/2021   Tobacco abuse counseling 10/22/2017   Morbid obesity (HCC) 10/22/2017   Arthritis of left knee  09/18/2015   Hypertension associated with diabetes (HCC) 08/22/2014   Hyperlipidemia associated with type 2 diabetes mellitus (HCC) 08/22/2014   Obesity (BMI 35.0-39.9 without comorbidity) 08/22/2014    Past Surgical History:  Procedure Laterality Date   CATARACT EXTRACTION Left 05/17/14   [facility] [place]   CATARACT EXTRACTION W/PHACO Right 07/26/2014   Procedure: CATARACT EXTRACTION PHACO AND INTRAOCULAR LENS PLACEMENT (IOC);  Surgeon: Lockie Mola, MD;  Location: [facility];  Service: Ophthalmology;  Laterality: Right;  DIABETIC   CESAREAN SECTION     FOOT SURGERY  2014    Family History  Problem Relation Age of Onset   Diabetes Father    Heart disease Father    Diabetes Sister    Hyperlipidemia Sister    Hypertension Sister    Hypertension Brother    Diabetes Brother    Diabetes Sister    Kidney disease Sister    Heart attack Sister    Diabetes Sister    Diabetes Sister    Diabetes Brother    Stomach cancer Brother    Stroke Brother    Breast cancer Neg Hx     Social History   Tobacco Use   Smoking status: Every Day  Packs/day: 1.00    Years: 29.00    Total pack years: 29.00    Types: Cigarettes   Smokeless tobacco: Current    Types: Snuff   Tobacco comments:    down to 8 cig per day   Substance Use Topics   Alcohol use: No    Alcohol/week: 0.0 standard drinks of alcohol     Current Outpatient Medications:    aspirin 81 MG tablet, Take 81 mg by mouth daily. PM, Disp: , Rfl:    Blood Glucose Monitoring Suppl (FREESTYLE LITE) DEVI, USE AS DIRECTED, Disp: 1 each, Rfl: 0   Cholecalciferol (VITAMIN D) 2000 units CAPS, Take 1 capsule (2,000 Units total) by mouth daily., Disp: 90 capsule, Rfl: 1   Dapagliflozin-metFORMIN HCl ER (XIGDUO XR) 11-998 MG TB24, TAKE 1 TABLET BY MOUTH DAILY. IN PLACE OF METFORMIN AND GLYBURIDE, Disp: 90 tablet, Rfl: 1   FREESTYLE LITE test strip, CHECK SUGARS ONCE A DAY, Disp: 100 strip, Rfl: 2   glipiZIDE (GLUCOTROL  XL) 10 MG 24 hr tablet, Take 1 tablet by mouth daily with breakfast., Disp: 90 tablet, Rfl: 0   pioglitazone (ACTOS) 15 MG tablet, Take 1 tablet by mouth daily., Disp: 90 tablet, Rfl: 0   propranolol (INDERAL) 10 MG tablet, TAKE 1 TABLET BY MOUTH 2 (TWO) TIMES DAILY., Disp: 180 tablet, Rfl: 1   rosuvastatin (CRESTOR) 40 MG tablet, Take 1 tablet (40 mg total) by mouth daily. New dose, Disp: 90 tablet, Rfl: 1   tiotropium (SPIRIVA) 18 MCG inhalation capsule, PLACE 1 CAPSULE INTO INHALER AND INHALE BY MOUTH DAILY, Disp: 30 capsule, Rfl: 5   TRULICITY 4.5 MG/0.5ML SOPN, INJECT 0.5 MLS (4.5 MG TOTAL) SUBCUTANEOUSLY EVERY SUNDAY, Disp: 6 mL, Rfl: 1   valsartan-hydrochlorothiazide (DIOVAN-HCT) 160-12.5 MG tablet, Take 1 tablet by mouth daily. Dose change from 160-25mg  to 160-12.5mg , Disp: 90 tablet, Rfl: 3  No Known Allergies  I personally reviewed active problem list, medication list, allergies, family history, social history, health maintenance with the patient/caregiver today.   ROS  Constitutional: Negative for fever or weight change.  Respiratory: positive  for smoker's cough and shortness of breath.   Cardiovascular: Negative for chest pain or palpitations.  Gastrointestinal: Negative for abdominal pain, no bowel changes.  Musculoskeletal: Negative for gait problem or joint swelling.  Skin: Negative for rash.  Neurological: Negative for dizziness or headache.  No other specific complaints in a complete review of systems (except as listed in HPI above).   Objective  Vitals:   09/02/21 1101  BP: 122/74  Pulse: 89  Resp: 16  SpO2: 98%  Weight: 171 lb (77.6 kg)  Height: 4\' 11"  (1.499 m)    Body mass index is 34.54 kg/m.  Physical Exam  Constitutional: Patient appears well-developed and well-nourished. Obese  No distress.  HEENT: head atraumatic, normocephalic, pupils equal and reactive to light,, neck supple Cardiovascular: Normal rate, regular rhythm and normal heart sounds.   No murmur heard. No BLE edema. Pulmonary/Chest: Effort normal and breath sounds normal. No respiratory distress. Abdominal: Soft.  There is no tenderness. Psychiatric: Patient has a normal mood and affect. behavior is normal. Judgment and thought content normal.   Recent Results (from the past 2160 hour(s))  POCT HgB A1C     Status: Abnormal   Collection Time: 09/02/21 11:02 AM  Result Value Ref Range   Hemoglobin A1C 7.0 (A) 4.0 - 5.6 %   HbA1c POC (<> result, manual entry)     HbA1c, POC (prediabetic range)  HbA1c, POC (controlled diabetic range)       PHQ2/9:    09/02/2021   11:02 AM 05/01/2021   10:43 AM 01/02/2021   10:38 AM 10/01/2020   11:28 AM 08/29/2020    2:06 PM  Depression screen PHQ 2/9  Decreased Interest 0 0 0 0 0  Down, Depressed, Hopeless 0 0 0 0 0  PHQ - 2 Score 0 0 0 0 0  Altered sleeping 0 0 0 0   Tired, decreased energy 0 0 0 0   Change in appetite 0 0 0 0   Feeling bad or failure about yourself  0 0 0 0   Trouble concentrating 0 0 0 0   Moving slowly or fidgety/restless 0 0 0 0   Suicidal thoughts 0 0 0 0   PHQ-9 Score 0 0 0 0   Difficult doing work/chores    Not difficult at all     phq 9 is negative   Fall Risk:    09/02/2021   11:02 AM 05/01/2021   10:43 AM 01/02/2021   10:38 AM 10/01/2020   11:28 AM 08/29/2020    2:06 PM  Fall Risk   Falls in the past year? 0 0 0 0 0  Number falls in past yr: 0 0 0 0 0  Injury with Fall? 0 0 0 0 0  Risk for fall due to : No Fall Risks No Fall Risks No Fall Risks    Follow up Falls prevention discussed Falls prevention discussed Falls prevention discussed        Functional Status Survey: Is the patient deaf or have difficulty hearing?: No Does the patient have difficulty seeing, even when wearing glasses/contacts?: No Does the patient have difficulty concentrating, remembering, or making decisions?: No Does the patient have difficulty walking or climbing stairs?: No Does the patient have difficulty  dressing or bathing?: No Does the patient have difficulty doing errands alone such as visiting a doctor's office or shopping?: No    Assessment & Plan  1. Hyperlipidemia associated with type 2 diabetes mellitus (HCC)  - POCT HgB A1C - rosuvastatin (CRESTOR) 40 MG tablet; Take 1 tablet (40 mg total) by mouth daily. New dose  Dispense: 90 tablet; Refill: 1 - pioglitazone (ACTOS) 15 MG tablet; Take 1 tablet (15 mg total) by mouth daily.  Dispense: 90 tablet; Refill: 1 - glipiZIDE (GLUCOTROL XL) 10 MG 24 hr tablet; Take 1 tablet by mouth daily with breakfast.  Dispense: 90 tablet; Refill: 1 - TRULICITY 4.5 MG/0.5ML SOPN; INJECT 0.5 MLS (4.5 MG TOTAL) SUBCUTANEOUSLY EVERY SUNDAY  Dispense: 6 mL; Refill: 1  2. Simple chronic bronchitis (HCC)  Discussed Spiriva daily   3. Hypertension associated with diabetes (HCC)  Bp is at goal   4. Stage 3a chronic kidney disease (HCC)   5. Obesity (BMI 30.0-34.9)  Discussed with the patient the risk posed by an increased BMI. Discussed importance of portion control, calorie counting and at least 150 minutes of physical activity weekly. Avoid sweet beverages and drink more water. Eat at least 6 servings of fruit and vegetables daily    6. Essential hypertension  - propranolol (INDERAL) 10 MG tablet; TAKE 1 TABLET BY MOUTH 2 (TWO) TIMES DAILY.  Dispense: 180 tablet; Refill: 1  7. Vitamin D deficiency

## 2021-09-02 ENCOUNTER — Ambulatory Visit (INDEPENDENT_AMBULATORY_CARE_PROVIDER_SITE_OTHER): Payer: 59 | Admitting: Family Medicine

## 2021-09-02 ENCOUNTER — Other Ambulatory Visit: Payer: Self-pay

## 2021-09-02 ENCOUNTER — Encounter: Payer: Self-pay | Admitting: Family Medicine

## 2021-09-02 VITALS — BP 122/74 | HR 89 | Resp 16 | Ht 59.0 in | Wt 171.0 lb

## 2021-09-02 DIAGNOSIS — I152 Hypertension secondary to endocrine disorders: Secondary | ICD-10-CM

## 2021-09-02 DIAGNOSIS — N1831 Chronic kidney disease, stage 3a: Secondary | ICD-10-CM

## 2021-09-02 DIAGNOSIS — J410 Simple chronic bronchitis: Secondary | ICD-10-CM

## 2021-09-02 DIAGNOSIS — E785 Hyperlipidemia, unspecified: Secondary | ICD-10-CM | POA: Diagnosis not present

## 2021-09-02 DIAGNOSIS — I10 Essential (primary) hypertension: Secondary | ICD-10-CM | POA: Diagnosis not present

## 2021-09-02 DIAGNOSIS — E1159 Type 2 diabetes mellitus with other circulatory complications: Secondary | ICD-10-CM | POA: Diagnosis not present

## 2021-09-02 DIAGNOSIS — E559 Vitamin D deficiency, unspecified: Secondary | ICD-10-CM

## 2021-09-02 DIAGNOSIS — E669 Obesity, unspecified: Secondary | ICD-10-CM

## 2021-09-02 DIAGNOSIS — E1169 Type 2 diabetes mellitus with other specified complication: Secondary | ICD-10-CM

## 2021-09-02 LAB — POCT GLYCOSYLATED HEMOGLOBIN (HGB A1C): Hemoglobin A1C: 7 % — AB (ref 4.0–5.6)

## 2021-09-02 MED ORDER — GLIPIZIDE ER 10 MG PO TB24
ORAL_TABLET | Freq: Every day | ORAL | 1 refills | Status: DC
  Filled 2021-09-02 – 2021-11-12 (×3): qty 90, 90d supply, fill #0

## 2021-09-02 MED ORDER — ROSUVASTATIN CALCIUM 40 MG PO TABS
40.0000 mg | ORAL_TABLET | Freq: Every day | ORAL | 1 refills | Status: DC
  Filled 2021-09-02 – 2021-11-12 (×2): qty 90, 90d supply, fill #0

## 2021-09-02 MED ORDER — TRULICITY 4.5 MG/0.5ML ~~LOC~~ SOAJ
SUBCUTANEOUS | 1 refills | Status: DC
  Filled 2021-09-02: qty 6, fill #0
  Filled 2021-09-16: qty 2, 28d supply, fill #0
  Filled 2021-10-09: qty 2, 28d supply, fill #1
  Filled 2021-11-06: qty 2, 28d supply, fill #2
  Filled 2021-12-17: qty 2, 28d supply, fill #3

## 2021-09-02 MED ORDER — PROPRANOLOL HCL 10 MG PO TABS
ORAL_TABLET | Freq: Two times a day (BID) | ORAL | 1 refills | Status: DC
  Filled 2021-09-02: qty 180, fill #0
  Filled 2021-09-16: qty 180, 90d supply, fill #0
  Filled 2021-12-17: qty 180, 90d supply, fill #1

## 2021-09-02 MED ORDER — PIOGLITAZONE HCL 15 MG PO TABS
15.0000 mg | ORAL_TABLET | Freq: Every day | ORAL | 1 refills | Status: DC
  Filled 2021-09-02 – 2021-11-03 (×3): qty 90, 90d supply, fill #0

## 2021-09-16 ENCOUNTER — Other Ambulatory Visit: Payer: Self-pay

## 2021-10-06 ENCOUNTER — Other Ambulatory Visit: Payer: Self-pay

## 2021-10-07 ENCOUNTER — Other Ambulatory Visit: Payer: Self-pay

## 2021-10-08 ENCOUNTER — Other Ambulatory Visit: Payer: Self-pay

## 2021-10-09 ENCOUNTER — Other Ambulatory Visit: Payer: Self-pay

## 2021-11-04 ENCOUNTER — Other Ambulatory Visit: Payer: Self-pay

## 2021-11-06 ENCOUNTER — Other Ambulatory Visit: Payer: Self-pay

## 2021-11-07 ENCOUNTER — Other Ambulatory Visit: Payer: Self-pay

## 2021-11-12 ENCOUNTER — Ambulatory Visit: Payer: Medicare Other

## 2021-11-12 ENCOUNTER — Other Ambulatory Visit: Payer: Self-pay

## 2021-11-13 ENCOUNTER — Other Ambulatory Visit: Payer: Self-pay

## 2021-12-17 ENCOUNTER — Other Ambulatory Visit: Payer: Self-pay

## 2022-01-01 NOTE — Progress Notes (Signed)
Name: Tracey Tucker   MRN: [id number]    DOB: Mar 23, 1955   Date:01/06/2022       Progress Note  Subjective  Chief Complaint  Follow Up  HPI  DMII: She is currently taking Trulicity 4.5 mg, Xigduo XR and Glipizide 10 mg also on Actos and she resumed a diabetic diet, A1C down from 7.2 % to 7.1 %, 6.9 % ,7 % today is down to 6.7 %   She has not been checking her glucose lately.  She is still not drinking sweet beverages.  She denies polyphagia, polydipsia and polyuria but has nocturia - she drinks a lot of water on the way home from home She has dyslipidemia. She is on simvastatin and ARB.  She has been walking 2 miles daily    HTN:  Denies any episodes of chest pain,  palpitation or SOB.  Denies dizziness.  BP is at goal    Dyslipidemia: she is on statin therapy, last LDL was 92 in 02/2020, she is now on Rosuvastatin, no side effects but last LDL was up to 129 and we will recheck it today    Morbid obesity:  : BMI is now above  35,  she retired 09/2021, she is walking but likely not as active, she will try a part time job. She does not have sweet beverages    Chronic bronchitis: she has been smoking for over 30 years, she was never a heavy smoker since she dips snuff, does not want lung cancer screening, she states no longer coughing   CKI stage III: she stopped meloxicam a couple of months ago, last GFR improved, we will continue to monitor. She is also on ARB and SGL-2 agonist . No side effects    Patient Active Problem List   Diagnosis Date Noted   Simple chronic bronchitis (HCC) 01/02/2021   Stage 3a chronic kidney disease (HCC) 01/02/2021   Vitamin D deficiency 01/02/2021   Dyslipidemia 01/02/2021   Tobacco abuse counseling 10/22/2017   Arthritis of left knee 09/18/2015   Hypertension associated with diabetes (HCC) 08/22/2014   Hyperlipidemia associated with type 2 diabetes mellitus (HCC) 08/22/2014    Past Surgical History:  Procedure Laterality Date   CATARACT EXTRACTION  Left 05/17/14   [facility] [place]   CATARACT EXTRACTION W/PHACO Right 07/26/2014   Procedure: CATARACT EXTRACTION PHACO AND INTRAOCULAR LENS PLACEMENT (IOC);  Surgeon: Lockie Mola, MD;  Location: [facility];  Service: Ophthalmology;  Laterality: Right;  DIABETIC   CESAREAN SECTION     FOOT SURGERY  2014    Family History  Problem Relation Age of Onset   Diabetes Father    Heart disease Father    Diabetes Sister    Hyperlipidemia Sister    Hypertension Sister    Hypertension Brother    Diabetes Brother    Diabetes Sister    Kidney disease Sister    Heart attack Sister    Diabetes Sister    Diabetes Sister    Diabetes Brother    Stomach cancer Brother    Stroke Brother    Breast cancer Neg Hx     Social History   Tobacco Use   Smoking status: Every Day    Packs/day: 0.25    Years: 29.00    Total pack years: 7.25    Types: Cigarettes    Start date: 12/28/1975   Smokeless tobacco: Current    Types: Snuff  Substance Use Topics   Alcohol use: No  Alcohol/week: 0.0 standard drinks of alcohol     Current Outpatient Medications:    aspirin 81 MG tablet, Take 81 mg by mouth daily. PM, Disp: , Rfl:    Blood Glucose Monitoring Suppl (FREESTYLE LITE) DEVI, USE AS DIRECTED, Disp: 1 each, Rfl: 0   Cholecalciferol (VITAMIN D) 2000 units CAPS, Take 1 capsule (2,000 Units total) by mouth daily., Disp: 90 capsule, Rfl: 1   FREESTYLE LITE test strip, CHECK SUGARS ONCE A DAY, Disp: 100 strip, Rfl: 2   tiotropium (SPIRIVA) 18 MCG inhalation capsule, PLACE 1 CAPSULE INTO INHALER AND INHALE BY MOUTH DAILY, Disp: 30 capsule, Rfl: 5   Dapagliflozin Pro-metFORMIN ER (XIGDUO XR) 11-998 MG TB24, Take 1 tablet by mouth daily at 12 noon., Disp: 90 tablet, Rfl: 1   glipiZIDE (GLUCOTROL XL) 10 MG 24 hr tablet, Take 1 tablet by mouth daily with breakfast., Disp: 90 tablet, Rfl: 1   pioglitazone (ACTOS) 15 MG tablet, Take 1 tablet (15 mg total) by mouth daily., Disp: 90 tablet,  Rfl: 1   propranolol (INDERAL) 10 MG tablet, TAKE 1 TABLET BY MOUTH 2 (TWO) TIMES DAILY., Disp: 180 tablet, Rfl: 1   rosuvastatin (CRESTOR) 40 MG tablet, Take 1 tablet (40 mg total) by mouth daily., Disp: 90 tablet, Rfl: 1   TRULICITY 4.5 MG/0.5ML SOPN, INJECT 0.5 MLS (4.5 MG TOTAL) SUBCUTANEOUSLY EVERY SUNDAY, Disp: 6 mL, Rfl: 1   valsartan-hydrochlorothiazide (DIOVAN-HCT) 160-12.5 MG tablet, Take 1 tablet by mouth daily. Dose change from 160-25mg  to 160-12.5mg , Disp: 90 tablet, Rfl: 1  No Known Allergies  I personally reviewed active problem list, medication list, allergies, family history, social history, health maintenance with the patient/caregiver today.   ROS  Constitutional: Negative for fever or weight change.  Respiratory: Negative for cough and shortness of breath.   Cardiovascular: Negative for chest pain or palpitations.  Gastrointestinal: Negative for abdominal pain, no bowel changes.  Musculoskeletal: Negative for gait problem or joint swelling.  Skin: Negative for rash.  Neurological: Negative for dizziness or headache.  No other specific complaints in a complete review of systems (except as listed in HPI above).   Objective  Vitals:   01/06/22 0855  BP: 120/72  Pulse: 89  Resp: 16  Temp: 97.9 F (36.6 C)  TempSrc: Oral  SpO2: 98%  Weight: 179 lb 6.4 oz (81.4 kg)  Height: 4\' 11"  (1.499 m)    Body mass index is 36.23 kg/m.  Physical Exam  Constitutional: Patient appears well-developed and well-nourished. Obese  No distress.  HEENT: head atraumatic, normocephalic, pupils equal and reactive to light, neck supple Cardiovascular: Normal rate, regular rhythm and normal heart sounds.  No murmur heard. No BLE edema. Pulmonary/Chest: Effort normal and breath sounds normal. No respiratory distress. Abdominal: Soft.  There is no tenderness. Psychiatric: Patient has a normal mood and affect. behavior is normal. Judgment and thought content normal.   Diabetic Foot  Exam: Diabetic Foot Exam - Simple   Simple Foot Form Visual Inspection No deformities, no ulcerations, no other skin breakdown bilaterally: Yes Sensation Testing Intact to touch and monofilament testing bilaterally: Yes Pulse Check Posterior Tibialis and Dorsalis pulse intact bilaterally: Yes Comments      PHQ2/9:    01/06/2022    9:00 AM 09/02/2021   11:02 AM 05/01/2021   10:43 AM 01/02/2021   10:38 AM 10/01/2020   11:28 AM  Depression screen PHQ 2/9  Decreased Interest 0 0 0 0 0  Down, Depressed, Hopeless 0 0 0 0 0  PHQ - 2 Score 0 0 0 0 0  Altered sleeping 0 0 0 0 0  Tired, decreased energy 0 0 0 0 0  Change in appetite 0 0 0 0 0  Feeling bad or failure about yourself  0 0 0 0 0  Trouble concentrating 0 0 0 0 0  Moving slowly or fidgety/restless 0 0 0 0 0  Suicidal thoughts 0 0 0 0 0  PHQ-9 Score 0 0 0 0 0  Difficult doing work/chores     Not difficult at all    phq 9 is negative   Fall Risk:    01/06/2022    8:59 AM 09/02/2021   11:02 AM 05/01/2021   10:43 AM 01/02/2021   10:38 AM 10/01/2020   11:28 AM  Fall Risk   Falls in the past year? 1 0 0 0 0  Number falls in past yr: 1 0 0 0 0  Injury with Fall? 0 0 0 0 0  Risk for fall due to : No Fall Risks No Fall Risks No Fall Risks No Fall Risks   Follow up Falls prevention discussed;Education provided;Falls evaluation completed Falls prevention discussed Falls prevention discussed Falls prevention discussed      Assessment & Plan  1. Hyperlipidemia associated with type 2 diabetes mellitus (HCC)  - POCT HgB A1C - HM Diabetes Foot Exam - Urine Microalbumin w/creat. ratio - COMPLETE METABOLIC PANEL WITH GFR - Lipid panel - pioglitazone (ACTOS) 15 MG tablet; Take 1 tablet (15 mg total) by mouth daily.  Dispense: 90 tablet; Refill: 1 - glipiZIDE (GLUCOTROL XL) 10 MG 24 hr tablet; Take 1 tablet by mouth daily with breakfast.  Dispense: 90 tablet; Refill: 1 - rosuvastatin (CRESTOR) 40 MG tablet; Take 1 tablet (40  mg total) by mouth daily.  Dispense: 90 tablet; Refill: 1 - TRULICITY 4.5 MG/0.5ML SOPN; INJECT 0.5 MLS (4.5 MG TOTAL) SUBCUTANEOUSLY EVERY SUNDAY  Dispense: 6 mL; Refill: 1  2. Simple chronic bronchitis (HCC)  Not ready to quit   3. Hypertension associated with diabetes (HCC)  BP is at goal   4. Stage 3a chronic kidney disease (HCC)  - CBC with Differential/Platelet  5. Morbid obesity (HCC)  Discussed with the patient the risk posed by an increased BMI. Discussed importance of portion control, calorie counting and at least 150 minutes of physical activity weekly. Avoid sweet beverages and drink more water. Eat at least 6 servings of fruit and vegetables daily    6. Vitamin D deficiency  - VITAMIN D 25 Hydroxy (Vit-D Deficiency, Fractures)  7. Osteopenia, unspecified location  - VITAMIN D 25 Hydroxy (Vit-D Deficiency, Fractures)  8. Long-term use of high-risk medication  - Vitamin B12  9. Diabetes mellitus type 2 in obese (HCC)  - Dapagliflozin Pro-metFORMIN ER (XIGDUO XR) 11-998 MG TB24; Take 1 tablet by mouth daily at 12 noon.  Dispense: 90 tablet; Refill: 1  10. Essential hypertension  - propranolol (INDERAL) 10 MG tablet; TAKE 1 TABLET BY MOUTH 2 (TWO) TIMES DAILY.  Dispense: 180 tablet; Refill: 1 - valsartan-hydrochlorothiazide (DIOVAN-HCT) 160-12.5 MG tablet; Take 1 tablet by mouth daily. Dose change from 160-25mg  to 160-12.5mg   Dispense: 90 tablet; Refill: 1

## 2022-01-06 ENCOUNTER — Ambulatory Visit (INDEPENDENT_AMBULATORY_CARE_PROVIDER_SITE_OTHER): Payer: Medicare Other | Admitting: Family Medicine

## 2022-01-06 ENCOUNTER — Encounter: Payer: Self-pay | Admitting: Family Medicine

## 2022-01-06 ENCOUNTER — Other Ambulatory Visit: Payer: Self-pay

## 2022-01-06 VITALS — BP 120/72 | HR 89 | Temp 97.9°F | Resp 16 | Ht 59.0 in | Wt 179.4 lb

## 2022-01-06 DIAGNOSIS — J410 Simple chronic bronchitis: Secondary | ICD-10-CM

## 2022-01-06 DIAGNOSIS — Z79899 Other long term (current) drug therapy: Secondary | ICD-10-CM

## 2022-01-06 DIAGNOSIS — I10 Essential (primary) hypertension: Secondary | ICD-10-CM

## 2022-01-06 DIAGNOSIS — I152 Hypertension secondary to endocrine disorders: Secondary | ICD-10-CM

## 2022-01-06 DIAGNOSIS — E1159 Type 2 diabetes mellitus with other circulatory complications: Secondary | ICD-10-CM

## 2022-01-06 DIAGNOSIS — Z23 Encounter for immunization: Secondary | ICD-10-CM

## 2022-01-06 DIAGNOSIS — M8580 Other specified disorders of bone density and structure, unspecified site: Secondary | ICD-10-CM

## 2022-01-06 DIAGNOSIS — N1831 Chronic kidney disease, stage 3a: Secondary | ICD-10-CM

## 2022-01-06 DIAGNOSIS — E1169 Type 2 diabetes mellitus with other specified complication: Secondary | ICD-10-CM

## 2022-01-06 DIAGNOSIS — E785 Hyperlipidemia, unspecified: Secondary | ICD-10-CM

## 2022-01-06 DIAGNOSIS — E669 Obesity, unspecified: Secondary | ICD-10-CM

## 2022-01-06 DIAGNOSIS — E559 Vitamin D deficiency, unspecified: Secondary | ICD-10-CM

## 2022-01-06 LAB — POCT GLYCOSYLATED HEMOGLOBIN (HGB A1C): Hemoglobin A1C: 6.7 % — AB (ref 4.0–5.6)

## 2022-01-06 MED ORDER — TRULICITY 4.5 MG/0.5ML ~~LOC~~ SOAJ
SUBCUTANEOUS | 1 refills | Status: DC
  Filled 2022-01-06: qty 2, 28d supply, fill #0
  Filled 2022-02-05: qty 2, 28d supply, fill #1
  Filled 2022-03-04: qty 2, 28d supply, fill #2
  Filled 2022-04-08: qty 2, 28d supply, fill #3
  Filled 2022-05-01: qty 2, 28d supply, fill #4
  Filled 2022-06-02: qty 2, 28d supply, fill #5

## 2022-01-06 MED ORDER — GLIPIZIDE ER 10 MG PO TB24
ORAL_TABLET | Freq: Every day | ORAL | 1 refills | Status: DC
  Filled 2022-01-06: qty 90, 90d supply, fill #0
  Filled 2022-05-12: qty 90, 90d supply, fill #1

## 2022-01-06 MED ORDER — ROSUVASTATIN CALCIUM 40 MG PO TABS
40.0000 mg | ORAL_TABLET | Freq: Every day | ORAL | 1 refills | Status: DC
  Filled 2022-01-06 (×2): qty 90, 90d supply, fill #0
  Filled 2022-05-12: qty 90, 90d supply, fill #1

## 2022-01-06 MED ORDER — PIOGLITAZONE HCL 15 MG PO TABS
15.0000 mg | ORAL_TABLET | Freq: Every day | ORAL | 1 refills | Status: DC
  Filled 2022-01-06: qty 90, 90d supply, fill #0
  Filled 2022-05-01: qty 90, 90d supply, fill #1

## 2022-01-06 MED ORDER — PROPRANOLOL HCL 10 MG PO TABS
ORAL_TABLET | Freq: Two times a day (BID) | ORAL | 1 refills | Status: DC
  Filled 2022-01-06 – 2022-03-20 (×2): qty 180, 90d supply, fill #0
  Filled 2022-06-12: qty 180, 90d supply, fill #1

## 2022-01-06 MED ORDER — VALSARTAN-HYDROCHLOROTHIAZIDE 160-12.5 MG PO TABS
1.0000 | ORAL_TABLET | Freq: Every day | ORAL | 1 refills | Status: DC
  Filled 2022-01-06: qty 90, 90d supply, fill #0
  Filled 2022-04-08: qty 90, 90d supply, fill #1

## 2022-01-06 MED ORDER — XIGDUO XR 10-1000 MG PO TB24
1.0000 | ORAL_TABLET | Freq: Every day | ORAL | 1 refills | Status: DC
  Filled 2022-01-06: qty 30, 30d supply, fill #0
  Filled 2022-02-05: qty 30, 30d supply, fill #1
  Filled 2022-03-04: qty 30, 30d supply, fill #2
  Filled 2022-04-08: qty 30, 30d supply, fill #3
  Filled 2022-05-12: qty 30, 30d supply, fill #4
  Filled 2022-06-12: qty 30, 30d supply, fill #5

## 2022-01-07 ENCOUNTER — Other Ambulatory Visit: Payer: Self-pay

## 2022-01-07 LAB — COMPLETE METABOLIC PANEL WITH GFR
AG Ratio: 1.5 (calc) (ref 1.0–2.5)
ALT: 10 U/L (ref 6–29)
AST: 14 U/L (ref 10–35)
Albumin: 4.3 g/dL (ref 3.6–5.1)
Alkaline phosphatase (APISO): 52 U/L (ref 37–153)
BUN: 21 mg/dL (ref 7–25)
CO2: 29 mmol/L (ref 20–32)
Calcium: 10.2 mg/dL (ref 8.6–10.4)
Chloride: 98 mmol/L (ref 98–110)
Creat: 0.97 mg/dL (ref 0.50–1.05)
Globulin: 2.9 g/dL (calc) (ref 1.9–3.7)
Glucose, Bld: 146 mg/dL — ABNORMAL HIGH (ref 65–99)
Potassium: 4.5 mmol/L (ref 3.5–5.3)
Sodium: 135 mmol/L (ref 135–146)
Total Bilirubin: 0.3 mg/dL (ref 0.2–1.2)
Total Protein: 7.2 g/dL (ref 6.1–8.1)
eGFR: 64 mL/min/{1.73_m2} (ref >=60)

## 2022-01-07 LAB — CBC WITH DIFFERENTIAL/PLATELET
Absolute Monocytes: 490 cells/uL (ref 200–950)
Basophils Absolute: 21 cells/uL (ref 0–200)
Basophils Relative: 0.3 %
Eosinophils Absolute: 92 cells/uL (ref 15–500)
Eosinophils Relative: 1.3 %
HCT: 40.3 % (ref 35.0–45.0)
Hemoglobin: 13.2 g/dL (ref 11.7–15.5)
Lymphs Abs: 2421 cells/uL (ref 850–3900)
MCH: 28.6 pg (ref 27.0–33.0)
MCHC: 32.8 g/dL (ref 32.0–36.0)
MCV: 87.2 fL (ref 80.0–100.0)
MPV: 10.7 fL (ref 7.5–12.5)
Monocytes Relative: 6.9 %
Neutro Abs: 4075 cells/uL (ref 1500–7800)
Neutrophils Relative %: 57.4 %
Platelets: 243 10*3/uL (ref 140–400)
RBC: 4.62 10*6/uL (ref 3.80–5.10)
RDW: 13.2 % (ref 11.0–15.0)
Total Lymphocyte: 34.1 %
WBC: 7.1 10*3/uL (ref 3.8–10.8)

## 2022-01-07 LAB — MICROALBUMIN / CREATININE URINE RATIO
Creatinine, Urine: 32 mg/dL (ref 20–275)
Microalb Creat Ratio: 28 mcg/mg creat (ref <30)
Microalb, Ur: 0.9 mg/dL

## 2022-01-07 LAB — VITAMIN D 25 HYDROXY (VIT D DEFICIENCY, FRACTURES): Vit D, 25-Hydroxy: 24 ng/mL — ABNORMAL LOW (ref 30–100)

## 2022-01-07 LAB — LIPID PANEL
Cholesterol: 143 mg/dL (ref <200)
HDL: 62 mg/dL (ref >=50)
LDL Cholesterol (Calc): 69 mg/dL (calc)
Non-HDL Cholesterol (Calc): 81 mg/dL (calc) (ref <130)
Total CHOL/HDL Ratio: 2.3 (calc) (ref <5.0)
Triglycerides: 50 mg/dL (ref <150)

## 2022-01-07 LAB — VITAMIN B12: Vitamin B-12: 457 pg/mL (ref 200–1100)

## 2022-01-08 ENCOUNTER — Other Ambulatory Visit: Payer: Self-pay

## 2022-02-05 ENCOUNTER — Other Ambulatory Visit: Payer: Self-pay

## 2022-02-06 ENCOUNTER — Other Ambulatory Visit: Payer: Self-pay

## 2022-02-11 DIAGNOSIS — Z961 Presence of intraocular lens: Secondary | ICD-10-CM | POA: Diagnosis not present

## 2022-02-11 DIAGNOSIS — E119 Type 2 diabetes mellitus without complications: Secondary | ICD-10-CM | POA: Diagnosis not present

## 2022-02-11 LAB — HM DIABETES EYE EXAM

## 2022-03-05 ENCOUNTER — Other Ambulatory Visit: Payer: Self-pay

## 2022-03-20 ENCOUNTER — Other Ambulatory Visit: Payer: Self-pay

## 2022-04-08 ENCOUNTER — Other Ambulatory Visit: Payer: Self-pay

## 2022-04-15 ENCOUNTER — Telehealth: Payer: Self-pay | Admitting: Family Medicine

## 2022-04-15 NOTE — Telephone Encounter (Signed)
Copied from [place] [id number]. Topic: General - Other >> Apr 15, 2022  1:27 PM [place] B wrote: Reason for CRM: Shubham from [facility] advantage states needs  recent , chart notes ,dx and lab values for glucose for patient for Trulicity 4.5 in order to get PA approved.

## 2022-04-15 NOTE — Telephone Encounter (Signed)
Called and could barely hear whoever answered the phone. A1c given, but could not understand anything they were saying despite me asking to repeat themselves or speak louder. They did not request any chart notes.

## 2022-05-01 ENCOUNTER — Other Ambulatory Visit: Payer: Self-pay

## 2022-05-06 NOTE — Progress Notes (Signed)
Name: Tracey Tucker   MRN: [id number]    DOB: 07-13-1955   Date:05/07/2022       Progress Note  Subjective  Chief Complaint  Follow Up  HPI  DMII: She is currently taking Trulicity 4.5 mg, Xigduo XR and Glipizide 10 mg also on Actos and she resumed a diabetic diet, A1C down from 7.2 % to 7.1 %, 6.9 % ,7 % down to 6.7 % and today it is  7.3 %   She has not been checking her glucose lately.  She is still not drinking sweet beverages, she is also walking daily and not sure why A1C went up.   She denies polyphagia, polydipsia and polyuria but has nocturia - she drinks a lot of water on the way home from home She has dyslipidemia, CKI, HTN. She is on simvastatin , SGL-2 agonist and ARB.     HTN:  Denies any episodes of chest pain,  palpitation or SOB.  Denies dizziness.  BP is towards low end of normal today   Osteopenia: discussed high calcium diet and take more vitamin D    Dyslipidemia: she is on statin therapy, last LDL was at goal down to 69    Morbid obesity:  : BMI is now above  35, with co-morbidities such as DM, Hypertension and dyslipidemia.  She retired 09/2021, she is walking but likely not as active, she has a part time job at the [employer], working 3 hours a day three days a week, she has lost 6 lbs since last visit. She does not have sweet beverages    Chronic bronchitis: she has been smoking for over 30 years, she was never a heavy smoker since she dips snuff, does not want lung cancer screening, she has a wet cough that is usually in the mornings. No SOB or wheezing   CKI stage III: she stopped NSAID's  last GFR improved  She is also on ARB and SGL-2 agonist . No side effects Last urine micro was normal No pruritus and she has good urine output   Nocturia: she is taking xigduo with a full glass of water before going to bed, advised to take it with supper   Vertigo: spinning sensation when she goes to bed at night, no nausea or vomiting or hearing loss, denies tinnitus.  Symptoms are brief and intermittent   Patient Active Problem List   Diagnosis Date Noted   Simple chronic bronchitis ([place]) 01/02/2021   Stage 3a chronic kidney disease ([place]) 01/02/2021   Vitamin D deficiency 01/02/2021   Dyslipidemia 01/02/2021   Tobacco abuse counseling 10/22/2017   Arthritis of left knee 09/18/2015   Hypertension associated with diabetes ([place]) 08/22/2014   Hyperlipidemia associated with type 2 diabetes mellitus ([place]) 08/22/2014    Past Surgical History:  Procedure Laterality Date   CATARACT EXTRACTION Left 05/17/14   [facility] [place]   CATARACT EXTRACTION W/PHACO Right 07/26/2014   Procedure: CATARACT EXTRACTION PHACO AND INTRAOCULAR LENS PLACEMENT ([place]);  Surgeon: Leandrew Koyanagi, MD;  Location: [facility];  Service: Ophthalmology;  Laterality: Right;  DIABETIC   CESAREAN SECTION     FOOT SURGERY  2014    Family History  Problem Relation Age of Onset   Diabetes Father    Heart disease Father    Diabetes Sister    Hyperlipidemia Sister    Hypertension Sister    Hypertension Brother    Diabetes Brother    Diabetes Sister    Kidney disease Sister  Heart attack Sister    Diabetes Sister    Diabetes Sister    Diabetes Brother    Stomach cancer Brother    Stroke Brother    Breast cancer Neg Hx     Social History   Tobacco Use   Smoking status: Every Day    Packs/day: 0.25    Years: 29.00    Additional pack years: 0.00    Total pack years: 7.25    Types: Cigarettes    Start date: 12/28/1975   Smokeless tobacco: Current    Types: Snuff  Substance Use Topics   Alcohol use: No    Alcohol/week: 0.0 standard drinks of alcohol     Current Outpatient Medications:    aspirin 81 MG tablet, Take 81 mg by mouth daily. PM, Disp: , Rfl:    Blood Glucose Monitoring Suppl (FREESTYLE LITE) DEVI, USE AS DIRECTED, Disp: 1 each, Rfl: 0   Cholecalciferol (VITAMIN D) 2000 units CAPS, Take 1 capsule (2,000 Units total) by mouth daily., Disp: 90  capsule, Rfl: 1   Dapagliflozin Pro-metFORMIN ER (XIGDUO XR) 11-998 MG TB24, Take 1 tablet by mouth daily at 12 noon., Disp: 90 tablet, Rfl: 1   FREESTYLE LITE test strip, CHECK SUGARS ONCE A DAY, Disp: 100 strip, Rfl: 2   glipiZIDE (GLUCOTROL XL) 10 MG 24 hr tablet, Take 1 tablet by mouth daily with breakfast., Disp: 90 tablet, Rfl: 1   pioglitazone (ACTOS) 15 MG tablet, Take 1 tablet (15 mg total) by mouth daily., Disp: 90 tablet, Rfl: 1   propranolol (INDERAL) 10 MG tablet, TAKE 1 TABLET BY MOUTH 2 (TWO) TIMES DAILY., Disp: 180 tablet, Rfl: 1   rosuvastatin (CRESTOR) 40 MG tablet, Take 1 tablet (40 mg total) by mouth daily., Disp: 90 tablet, Rfl: 1   TRULICITY 4.5 0000000 SOPN, INJECT 0.5 MLS (4.5 MG TOTAL) SUBCUTANEOUSLY EVERY SUNDAY, Disp: 6 mL, Rfl: 1   valsartan-hydrochlorothiazide (DIOVAN-HCT) 160-12.5 MG tablet, Take 1 tablet by mouth daily., Disp: 90 tablet, Rfl: 1   tiotropium (SPIRIVA) 18 MCG inhalation capsule, PLACE 1 CAPSULE INTO INHALER AND INHALE BY MOUTH DAILY, Disp: 30 capsule, Rfl: 5  No Known Allergies  I personally reviewed active problem list, medication list, allergies, family history, social history, health maintenance with the patient/caregiver today.   ROS  Ten systems reviewed and is negative except as mentioned in HPI   Objective  Vitals:   05/07/22 0944  BP: 110/70  Pulse: 85  Resp: 14  Temp: 98 F (36.7 C)  TempSrc: Oral  SpO2: 99%  Weight: 173 lb 12.8 oz (78.8 kg)  Height: 4\' 11"  (1.499 m)    Body mass index is 35.1 kg/m.  Physical Exam  Constitutional: Patient appears well-developed and well-nourished. Obese  No distress.  HEENT: head atraumatic, normocephalic, pupils equal and reactive to light, neck supple Cardiovascular: Normal rate, regular rhythm and normal heart sounds.  No murmur heard. No BLE edema. Pulmonary/Chest: Effort normal and breath sounds normal. No respiratory distress. Abdominal: Soft.  There is no  tenderness. Psychiatric: Patient has a normal mood and affect. behavior is normal. Judgment and thought content normal.   Recent Results (from the past 2160 hour(s))  HM DIABETES EYE EXAM     Status: None   Collection Time: 02/11/22 12:00 AM  Result Value Ref Range   HM Diabetic Eye Exam No Retinopathy No Retinopathy  POCT HgB A1C     Status: Abnormal   Collection Time: 05/07/22  9:53 AM  Result Value Ref  Range   Hemoglobin A1C 7.3 (A) 4.0 - 5.6 %   HbA1c POC (<> result, manual entry)     HbA1c, POC (prediabetic range)     HbA1c, POC (controlled diabetic range)       PHQ2/9:    05/07/2022    9:52 AM 01/06/2022    9:00 AM 09/02/2021   11:02 AM 05/01/2021   10:43 AM 01/02/2021   10:38 AM  Depression screen PHQ 2/9  Decreased Interest 0 0 0 0 0  Down, Depressed, Hopeless 0 0 0 0 0  PHQ - 2 Score 0 0 0 0 0  Altered sleeping 0 0 0 0 0  Tired, decreased energy 0 0 0 0 0  Change in appetite 0 0 0 0 0  Feeling bad or failure about yourself  0 0 0 0 0  Trouble concentrating 0 0 0 0 0  Moving slowly or fidgety/restless 0 0 0 0 0  Suicidal thoughts 0 0 0 0 0  PHQ-9 Score 0 0 0 0 0    phq 9 is negative   Fall Risk:    05/07/2022    9:52 AM 01/06/2022    8:59 AM 09/02/2021   11:02 AM 05/01/2021   10:43 AM 01/02/2021   10:38 AM  Fall Risk   Falls in the past year? 0 1 0 0 0  Number falls in past yr:  1 0 0 0  Injury with Fall?  0 0 0 0  Risk for fall due to : No Fall Risks No Fall Risks No Fall Risks No Fall Risks No Fall Risks  Follow up Falls prevention discussed Falls prevention discussed;Education provided;Falls evaluation completed Falls prevention discussed Falls prevention discussed Falls prevention discussed      Functional Status Survey: Is the patient deaf or have difficulty hearing?: No Does the patient have difficulty seeing, even when wearing glasses/contacts?: No Does the patient have difficulty concentrating, remembering, or making decisions?: No Does the  patient have difficulty walking or climbing stairs?: No Does the patient have difficulty dressing or bathing?: No Does the patient have difficulty doing errands alone such as visiting a doctor's office or shopping?: No    Assessment & Plan  1. Hyperlipidemia associated with type 2 diabetes mellitus (HCC)  - POCT HgB A1C  2. Vertigo  Only when goes to bed at night and intermittent, discussed BPPV but does not bother her enough to do epley maneuver   3. Hypertension associated with diabetes ([place])  Towards low end of normal but no symptoms   4. Vitamin D deficiency  Take 2000 units daily   5. Simple chronic bronchitis ([place])  Does not want to have CT lung screen   6. Morbid obesity ([place])  Discussed with the patient the risk posed by an increased BMI. Discussed importance of portion control, calorie counting and at least 150 minutes of physical activity weekly. Avoid sweet beverages and drink more water. Eat at least 6 servings of fruit and vegetables daily    7. Osteopenia after menopause  Discussed high calcium diet and increase vitamin D supplementation

## 2022-05-07 ENCOUNTER — Encounter: Payer: Self-pay | Admitting: Family Medicine

## 2022-05-07 ENCOUNTER — Ambulatory Visit (INDEPENDENT_AMBULATORY_CARE_PROVIDER_SITE_OTHER): Payer: PPO | Admitting: Family Medicine

## 2022-05-07 VITALS — BP 110/70 | HR 85 | Temp 98.0°F | Resp 14 | Ht 59.0 in | Wt 173.8 lb

## 2022-05-07 DIAGNOSIS — E1169 Type 2 diabetes mellitus with other specified complication: Secondary | ICD-10-CM | POA: Diagnosis not present

## 2022-05-07 DIAGNOSIS — J410 Simple chronic bronchitis: Secondary | ICD-10-CM | POA: Diagnosis not present

## 2022-05-07 DIAGNOSIS — I152 Hypertension secondary to endocrine disorders: Secondary | ICD-10-CM

## 2022-05-07 DIAGNOSIS — E559 Vitamin D deficiency, unspecified: Secondary | ICD-10-CM | POA: Diagnosis not present

## 2022-05-07 DIAGNOSIS — E785 Hyperlipidemia, unspecified: Secondary | ICD-10-CM

## 2022-05-07 DIAGNOSIS — M8580 Other specified disorders of bone density and structure, unspecified site: Secondary | ICD-10-CM

## 2022-05-07 DIAGNOSIS — R42 Dizziness and giddiness: Secondary | ICD-10-CM | POA: Diagnosis not present

## 2022-05-07 DIAGNOSIS — Z780 Asymptomatic menopausal state: Secondary | ICD-10-CM | POA: Diagnosis not present

## 2022-05-07 DIAGNOSIS — E1159 Type 2 diabetes mellitus with other circulatory complications: Secondary | ICD-10-CM | POA: Diagnosis not present

## 2022-05-07 LAB — POCT GLYCOSYLATED HEMOGLOBIN (HGB A1C): Hemoglobin A1C: 7.3 % — AB (ref 4.0–5.6)

## 2022-05-13 ENCOUNTER — Other Ambulatory Visit: Payer: Self-pay

## 2022-05-18 IMAGING — CR DG FOOT COMPLETE 3+V*L*
1 series · 3 of 3 positions shown · non-contrast
Comparison: And plain films left ankle 03/15/2012.

CLINICAL DATA: Left foot pain and swelling since 05/31/2020. No
known injury.

EXAM:
LEFT FOOT - COMPLETE 3+ VIEW

[Series 1: dg foot complete left · 0.14mm/px · 3 of 3 slices shown]
[im 1/3]
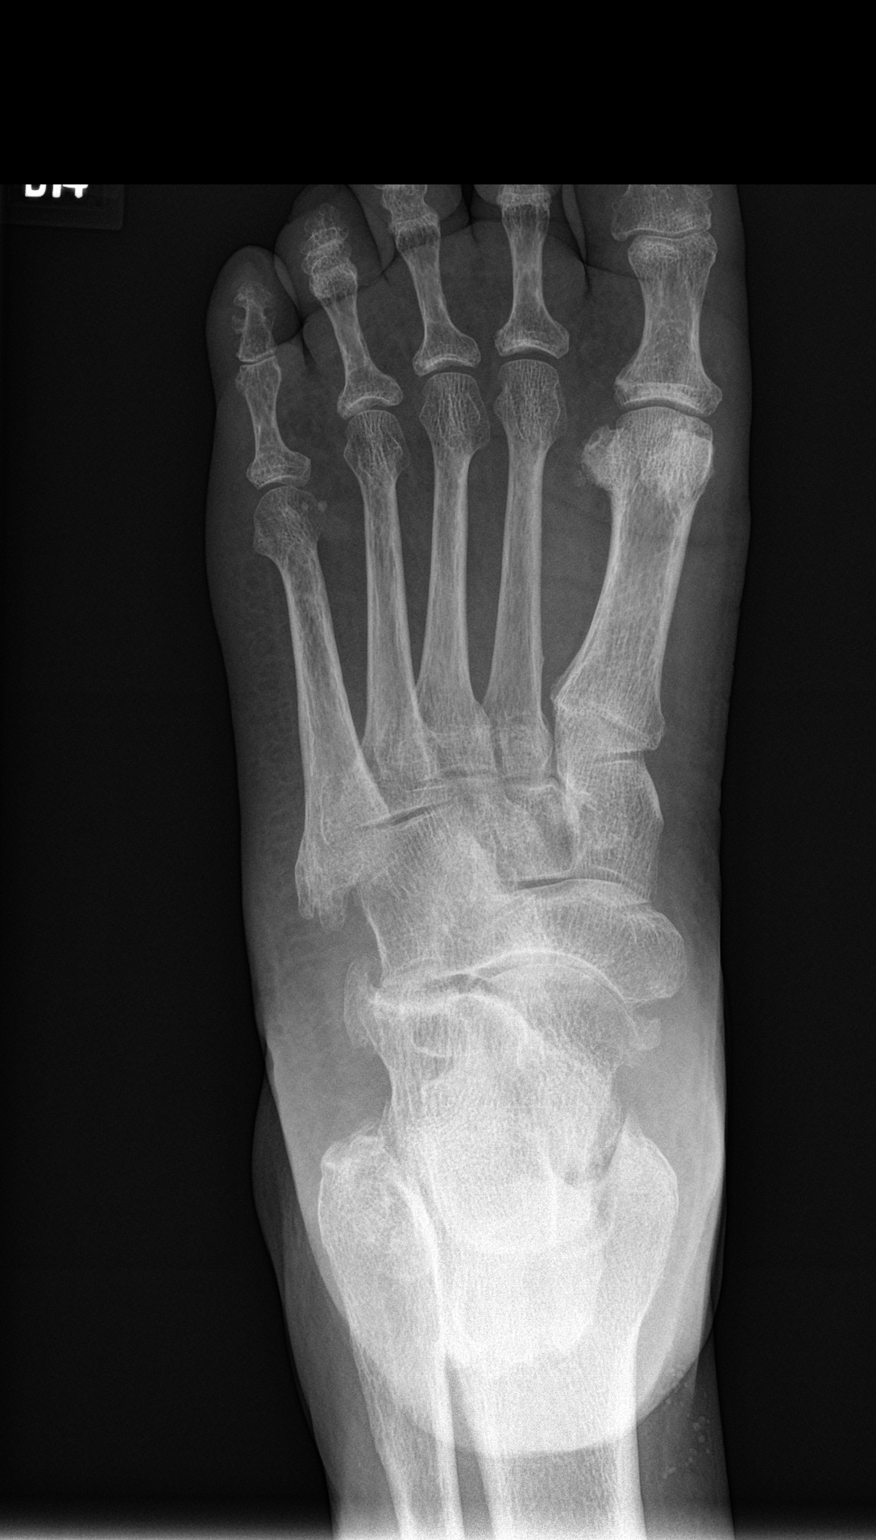
[im 2/3]
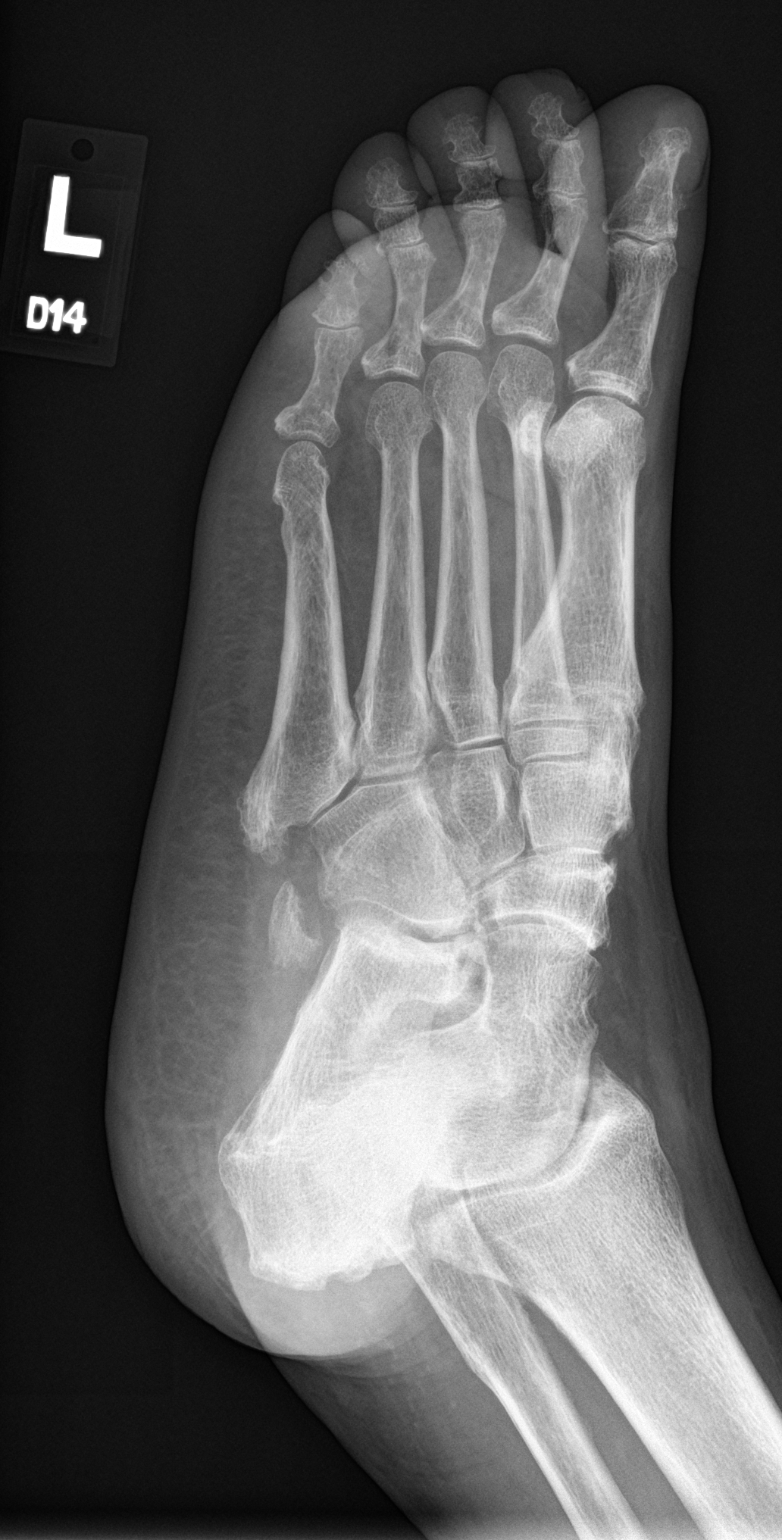
[im 3/3]
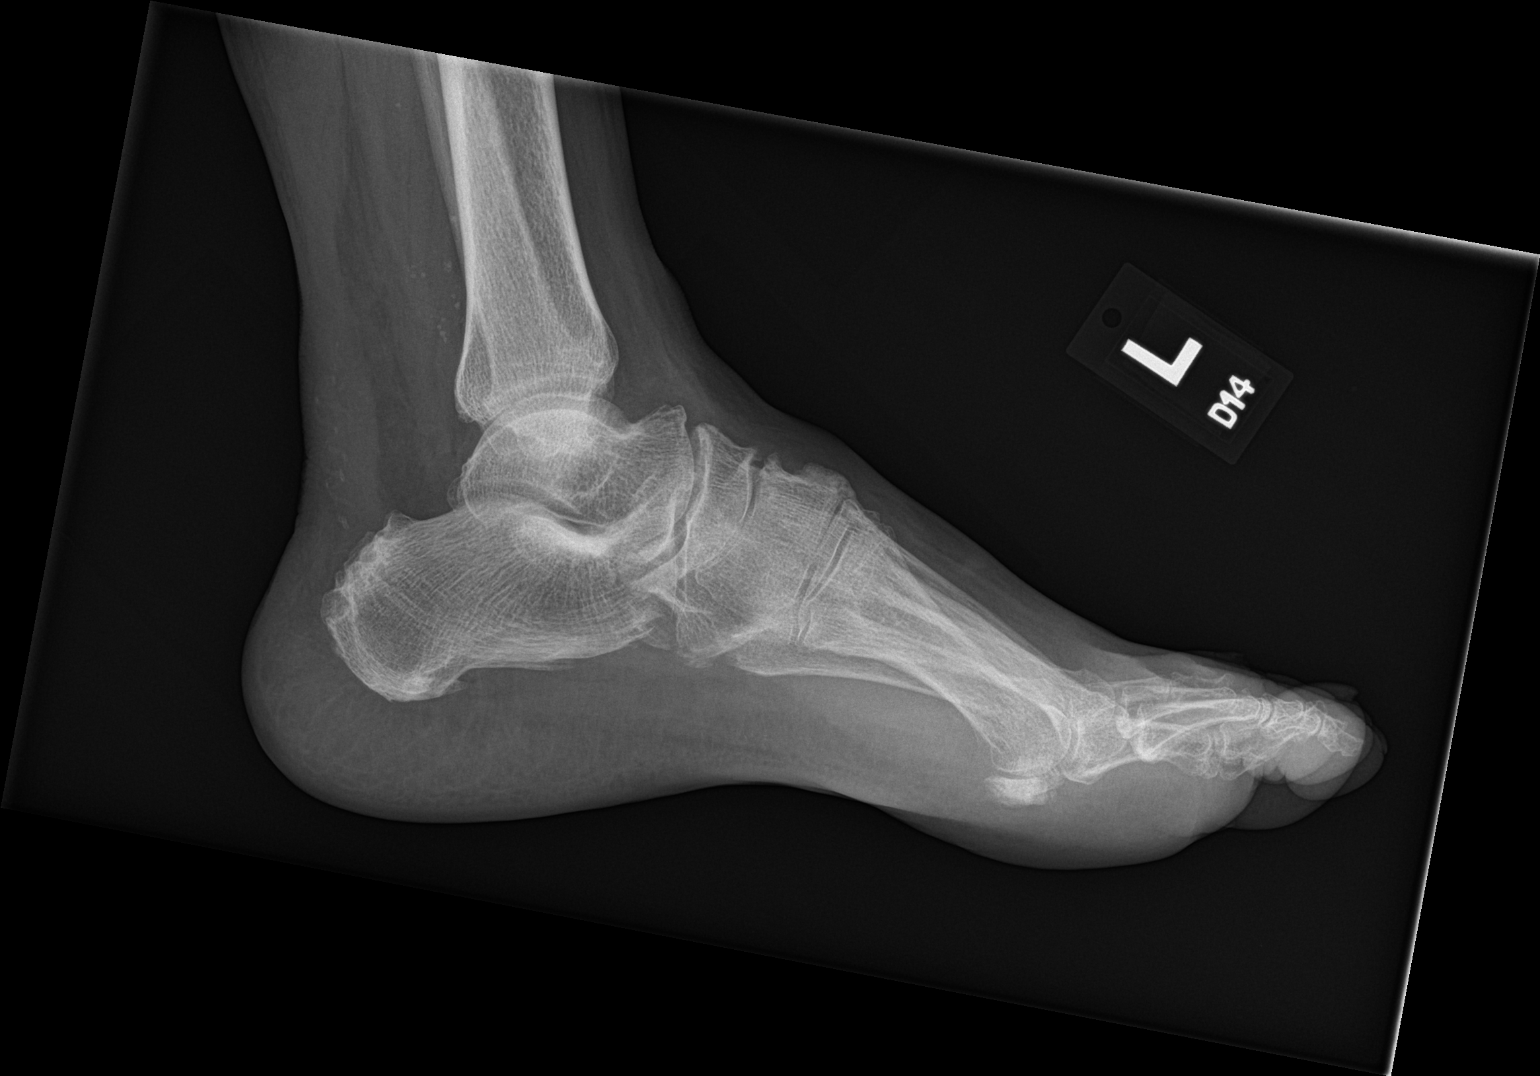

[3 of 3 positions shown; findings below may reference images not displayed]

FINDINGS: There is no acute bony or joint abnormality. The patient is status
post resection of the dorsal calcaneus for Haglund's deformity,
unchanged in appearance. Mild thickening of the distal Achilles
tendon is chronic. Moderate to moderately severe midfoot
osteoarthritis appears unchanged.
IMPRESSION: No acute abnormality.

No marked change in moderate to moderately severe midfoot
osteoarthritis.

Postoperative change calcaneus and Achilles tendon.

## 2022-06-02 ENCOUNTER — Other Ambulatory Visit: Payer: Self-pay | Admitting: Family Medicine

## 2022-06-02 ENCOUNTER — Other Ambulatory Visit: Payer: Self-pay

## 2022-06-02 DIAGNOSIS — J410 Simple chronic bronchitis: Secondary | ICD-10-CM

## 2022-06-03 ENCOUNTER — Other Ambulatory Visit: Payer: Self-pay

## 2022-06-03 MED ORDER — TIOTROPIUM BROMIDE MONOHYDRATE 18 MCG IN CAPS
1.0000 | ORAL_CAPSULE | Freq: Every day | RESPIRATORY_TRACT | 5 refills | Status: DC
  Filled 2022-06-03: qty 30, 30d supply, fill #0

## 2022-06-05 ENCOUNTER — Ambulatory Visit: Payer: PPO

## 2022-06-06 ENCOUNTER — Other Ambulatory Visit: Payer: Self-pay

## 2022-06-10 ENCOUNTER — Other Ambulatory Visit: Payer: Self-pay

## 2022-06-11 ENCOUNTER — Other Ambulatory Visit: Payer: Self-pay

## 2022-06-11 ENCOUNTER — Other Ambulatory Visit: Payer: Self-pay | Admitting: Family Medicine

## 2022-06-11 MED ORDER — SEMAGLUTIDE (2 MG/DOSE) 8 MG/3ML ~~LOC~~ SOPN
2.0000 mg | PEN_INJECTOR | SUBCUTANEOUS | 0 refills | Status: DC
  Filled 2022-06-11 – 2022-06-17 (×2): qty 3, 28d supply, fill #0
  Filled 2022-06-26: qty 9, 84d supply, fill #0

## 2022-06-12 ENCOUNTER — Other Ambulatory Visit: Payer: Self-pay

## 2022-06-13 ENCOUNTER — Other Ambulatory Visit: Payer: Self-pay

## 2022-06-16 ENCOUNTER — Other Ambulatory Visit: Payer: Self-pay

## 2022-06-17 ENCOUNTER — Other Ambulatory Visit: Payer: Self-pay

## 2022-06-20 ENCOUNTER — Other Ambulatory Visit: Payer: Self-pay

## 2022-06-23 ENCOUNTER — Other Ambulatory Visit: Payer: Self-pay

## 2022-06-25 ENCOUNTER — Other Ambulatory Visit: Payer: Self-pay

## 2022-06-26 ENCOUNTER — Other Ambulatory Visit: Payer: Self-pay

## 2022-06-30 ENCOUNTER — Other Ambulatory Visit: Payer: Self-pay

## 2022-06-30 MED ORDER — OZEMPIC (2 MG/DOSE) 8 MG/3ML ~~LOC~~ SOPN
2.0000 mg | PEN_INJECTOR | SUBCUTANEOUS | 3 refills | Status: DC
  Filled 2022-09-28 – 2022-09-29 (×2): qty 3, 28d supply, fill #0
  Filled 2022-10-01: qty 15, 140d supply, fill #0
  Filled 2023-01-20: qty 12, 112d supply, fill #1
  Filled 2023-05-06: qty 12, 112d supply, fill #2

## 2022-07-02 ENCOUNTER — Other Ambulatory Visit: Payer: Self-pay

## 2022-07-02 ENCOUNTER — Other Ambulatory Visit: Payer: Self-pay | Admitting: Family Medicine

## 2022-07-02 DIAGNOSIS — I10 Essential (primary) hypertension: Secondary | ICD-10-CM

## 2022-07-02 MED ORDER — VALSARTAN-HYDROCHLOROTHIAZIDE 160-12.5 MG PO TABS
1.0000 | ORAL_TABLET | Freq: Every day | ORAL | 0 refills | Status: DC
  Filled 2022-07-02: qty 90, 90d supply, fill #0

## 2022-07-09 ENCOUNTER — Other Ambulatory Visit: Payer: Self-pay

## 2022-07-28 ENCOUNTER — Other Ambulatory Visit: Payer: Self-pay | Admitting: Family Medicine

## 2022-07-28 ENCOUNTER — Other Ambulatory Visit: Payer: Self-pay

## 2022-07-28 DIAGNOSIS — E785 Hyperlipidemia, unspecified: Secondary | ICD-10-CM

## 2022-07-28 MED ORDER — PIOGLITAZONE HCL 15 MG PO TABS
15.0000 mg | ORAL_TABLET | Freq: Every day | ORAL | 0 refills | Status: DC
  Filled 2022-07-28: qty 90, 90d supply, fill #0

## 2022-08-04 ENCOUNTER — Other Ambulatory Visit: Payer: Self-pay | Admitting: Family Medicine

## 2022-08-04 DIAGNOSIS — Z1231 Encounter for screening mammogram for malignant neoplasm of breast: Secondary | ICD-10-CM

## 2022-08-05 ENCOUNTER — Other Ambulatory Visit: Payer: Self-pay

## 2022-08-05 ENCOUNTER — Other Ambulatory Visit: Payer: Self-pay | Admitting: Family Medicine

## 2022-08-05 DIAGNOSIS — E1169 Type 2 diabetes mellitus with other specified complication: Secondary | ICD-10-CM

## 2022-08-05 MED ORDER — ROSUVASTATIN CALCIUM 40 MG PO TABS
40.0000 mg | ORAL_TABLET | Freq: Every day | ORAL | 0 refills | Status: DC
  Filled 2022-08-05: qty 90, 90d supply, fill #0

## 2022-08-05 MED ORDER — GLIPIZIDE ER 10 MG PO TB24
ORAL_TABLET | Freq: Every day | ORAL | 0 refills | Status: DC
  Filled 2022-08-05: qty 90, 90d supply, fill #0

## 2022-08-13 ENCOUNTER — Ambulatory Visit
Admission: RE | Admit: 2022-08-13 | Discharge: 2022-08-13 | Disposition: A | Discharge status: Home / Self Care | Payer: PPO | Source: Ambulatory Visit | Attending: Family Medicine | Admitting: Family Medicine

## 2022-08-13 DIAGNOSIS — Z1231 Encounter for screening mammogram for malignant neoplasm of breast: Secondary | ICD-10-CM | POA: Diagnosis not present

## 2022-08-27 ENCOUNTER — Other Ambulatory Visit: Payer: Self-pay

## 2022-09-02 ENCOUNTER — Other Ambulatory Visit: Payer: Self-pay

## 2022-09-03 ENCOUNTER — Other Ambulatory Visit: Payer: Self-pay

## 2022-09-05 ENCOUNTER — Ambulatory Visit (INDEPENDENT_AMBULATORY_CARE_PROVIDER_SITE_OTHER): Payer: PPO

## 2022-09-05 VITALS — Ht 59.0 in | Wt 173.0 lb

## 2022-09-05 DIAGNOSIS — Z0000 Encounter for general adult medical examination without abnormal findings: Secondary | ICD-10-CM | POA: Diagnosis not present

## 2022-09-05 NOTE — Progress Notes (Addendum)
Subjective:   Tracey Tucker is a 67 y.o. female who presents for Medicare Annual (Subsequent) preventive examination.  Visit Complete: Virtual  I connected with  Tracey Tucker on 09/05/22 by a audio enabled telemedicine application and verified that I am speaking with the correct person using two identifiers.  Patient Location: Home  Provider Location: Home Office  I discussed the limitations of evaluation and management by telemedicine. The patient expressed understanding and agreed to proceed.  Vital Signs: Unable to obtain new vitals due to this being a telehealth visit.  Patient Medicare AWV questionnaire was completed by the patient on (not done); I have confirmed that all information answered by patient is correct and no changes since this date.  Review of Systems    Cardiac Risk Factors include: advanced age (>64men, >6 women);dyslipidemia;hypertension;obesity (BMI >30kg/m2)    Objective:    Today's Vitals   09/05/22 0924  Weight: 173 lb (78.5 kg)  Height: 4\' 11"  (1.499 m)   Body mass index is 34.94 kg/m.     09/05/2022    9:34 AM 12/12/2016   10:04 AM 09/11/2016    9:37 AM 06/11/2016   11:22 AM 03/11/2016   11:13 AM 12/19/2015    8:47 AM 09/18/2015    3:00 PM  Advanced Directives  Does Patient Have a Medical Advance Directive? No No No No No No No  Would patient like information on creating a medical advance directive?      No - patient declined information No - patient declined information    Current Medications (verified) Outpatient Encounter Medications as of 09/05/2022  Medication Sig   aspirin 81 MG tablet Take 81 mg by mouth daily. PM   Blood Glucose Monitoring Suppl (FREESTYLE LITE) DEVI USE AS DIRECTED   Cholecalciferol (VITAMIN D) 2000 units CAPS Take 1 capsule (2,000 Units total) by mouth daily.   Dapagliflozin Pro-metFORMIN ER (XIGDUO XR) 11-998 MG TB24 Take 1 tablet by mouth daily at 12 noon.   FREESTYLE LITE test strip CHECK SUGARS ONCE A DAY    glipiZIDE (GLUCOTROL XL) 10 MG 24 hr tablet Take 1 tablet by mouth daily with breakfast.   pioglitazone (ACTOS) 15 MG tablet Take 1 tablet (15 mg total) by mouth daily.   propranolol (INDERAL) 10 MG tablet TAKE 1 TABLET BY MOUTH 2 (TWO) TIMES DAILY.   rosuvastatin (CRESTOR) 40 MG tablet Take 1 tablet (40 mg total) by mouth daily.   Semaglutide, 2 MG/DOSE, (OZEMPIC, 2 MG/DOSE,) 8 MG/3ML SOPN Inject 2 mg as directed once a week.   Semaglutide, 2 MG/DOSE, 8 MG/3ML SOPN Inject 2 mg as directed once a week.   tiotropium (SPIRIVA HANDIHALER) 18 MCG inhalation capsule Place 1 capsule (18 mcg total) into inhaler and inhale daily.   valsartan-hydrochlorothiazide (DIOVAN-HCT) 160-12.5 MG tablet Take 1 tablet by mouth daily.   No facility-administered encounter medications on file as of 09/05/2022.    Allergies (verified) Patient has no known allergies.   History: Past Medical History:  Diagnosis Date   Diabetes mellitus without complication (HCC)    Hypercholesteremia    Hypertension    Osteoporosis    Seasonal allergies    Wears dentures    full upper, partial lower   Past Surgical History:  Procedure Laterality Date   CATARACT EXTRACTION Left 05/17/14   [facility] [place]   CATARACT EXTRACTION W/PHACO Right 07/26/2014   Procedure: CATARACT EXTRACTION PHACO AND INTRAOCULAR LENS PLACEMENT ([facility]);  Surgeon: Lockie Mola, MD;  Location: [facility];  Service: Ophthalmology;  Laterality: Right;  DIABETIC   CESAREAN SECTION     FOOT SURGERY  2014   Family History  Problem Relation Age of Onset   Diabetes Father    Heart disease Father    Diabetes Sister    Hyperlipidemia Sister    Hypertension Sister    Hypertension Brother    Diabetes Brother    Diabetes Sister    Kidney disease Sister    Heart attack Sister    Diabetes Sister    Diabetes Sister    Diabetes Brother    Stomach cancer Brother    Stroke Brother    Breast cancer Neg Hx    Social History   Socioeconomic  History   Marital status: Single    Spouse name: Not on file   Number of children: Not on file   Years of education: Not on file   Highest education level: Not on file  Occupational History   Not on file  Tobacco Use   Smoking status: Every Day    Current packs/day: 0.25    Average packs/day: 0.2 packs/day for 46.7 years (11.7 ttl pk-yrs)    Types: Cigarettes    Start date: 12/28/1975   Smokeless tobacco: Current    Types: Snuff  Vaping Use   Vaping status: Never Used  Substance and Sexual Activity   Alcohol use: No    Alcohol/week: 0.0 standard drinks of alcohol   Drug use: No   Sexual activity: Not Currently  Other Topics Concern   Not on file  Social History Narrative   Retired!!!   She lives with sister , one nephew and one niece    Social Determinants of Health   Financial Resource Strain: Low Risk  (09/05/2022)   Overall Financial Resource Strain (CARDIA)    Difficulty of Paying Living Expenses: Not hard at all  Food Insecurity: No Food Insecurity (09/05/2022)   Hunger Vital Sign    Worried About Running Out of Food in the Last Year: Never true    Ran Out of Food in the Last Year: Never true  Transportation Needs: Unknown (09/05/2022)   PRAPARE - Administrator, Civil Service (Medical): No    Lack of Transportation (Non-Medical): Not on file  Physical Activity: Sufficiently Active (09/05/2022)   Exercise Vital Sign    Days of Exercise per Week: 6 days    Minutes of Exercise per Session: 60 min  Stress: No Stress Concern Present (09/05/2022)   [employer] of Occupational Health - Occupational Stress Questionnaire    Feeling of Stress : Not at all  Social Connections: Moderately Integrated (09/05/2022)   Social Connection and Isolation Panel [NHANES]    Frequency of Communication with Friends and Family: More than three times a week    Frequency of Social Gatherings with Friends and Family: More than three times a week    Attends Religious Services:  More than 4 times per year    Active Member of [employer] or Organizations: Yes    Attends Engineer, structural: More than 4 times per year    Marital Status: Never married    Tobacco Counseling Ready to quit: Not Answered Counseling given: Not Answered   Clinical Intake:  Pre-visit preparation completed: Yes  Pain : No/denies pain     BMI - recorded: 34.94 Nutritional Status: BMI > 30  Obese Nutritional Risks: None Diabetes: Yes CBG done?: No Did pt. bring in CBG monitor from home?: No  How  often do you need to have someone help you when you read instructions, pamphlets, or other written materials from your doctor or pharmacy?: 1 - Never  Interpreter Needed?: No  Comments: lives with sister and nephew Information entered by :: B.Joannie Medine,LPN   Activities of Daily Living    09/05/2022    9:34 AM 05/07/2022    9:52 AM  In your present state of health, do you have any difficulty performing the following activities:  Hearing? 0 0  Vision? 0 0  Difficulty concentrating or making decisions? 0 0  Walking or climbing stairs? 0 0  Dressing or bathing? 0 0  Doing errands, shopping? 0 0  Preparing Food and eating ? N   Using the Toilet? N   In the past six months, have you accidently leaked urine? N   Do you have problems with loss of bowel control? N   Managing your Medications? N   Managing your Finances? N   Housekeeping or managing your Housekeeping? N     Patient Care Team: Alba Cory, MD as PCP - General (Family Medicine)  Indicate any recent Medical Services you may have received from other than [facility] providers in the past year (date may be approximate).     Assessment:   This is a routine wellness examination for Tracey Tucker.  Hearing/Vision screen Hearing Screening - Comments:: Adequate hearing  Dietary issues and exercise activities discussed:     Goals Addressed   None    Depression Screen    09/05/2022    9:31 AM 05/07/2022    9:52 AM  01/06/2022    9:00 AM 09/02/2021   11:02 AM 05/01/2021   10:43 AM 01/02/2021   10:38 AM 10/01/2020   11:28 AM  PHQ 2/9 Scores  PHQ - 2 Score 0 0 0 0 0 0 0  PHQ- 9 Score  0 0 0 0 0 0    Fall Risk    09/05/2022    9:27 AM 05/07/2022    9:52 AM 01/06/2022    8:59 AM 09/02/2021   11:02 AM 05/01/2021   10:43 AM  Fall Risk   Falls in the past year? 0 0 1 0 0  Number falls in past yr: 0  1 0 0  Injury with Fall? 0  0 0 0  Risk for fall due to : No Fall Risks No Fall Risks No Fall Risks No Fall Risks No Fall Risks  Follow up Education provided;Falls prevention discussed Falls prevention discussed Falls prevention discussed;Education provided;Falls evaluation completed Falls prevention discussed Falls prevention discussed    MEDICARE RISK AT HOME:  Medicare Risk at Home - 09/05/22 0928     Any stairs in or around the home? Yes   outside...ramp in back   If so, are there any without handrails? Yes    Home free of loose throw rugs in walkways, pet beds, electrical cords, etc? Yes    Adequate lighting in your home to reduce risk of falls? Yes    Life alert? No    Use of a cane, walker or w/c? No    Grab bars in the bathroom? Yes    Shower chair or bench in shower? No    Elevated toilet seat or a handicapped toilet? No             TIMED UP AND GO:  Was the test performed?  No    Cognitive Function:        09/05/2022    9:35  AM  6CIT Screen  What Year? 0 points  What month? 0 points  What time? 0 points  Count back from 20 0 points  Months in reverse 4 points  Repeat phrase 4 points  Total Score 8 points    Immunizations Immunization History  Administered Date(s) Administered   Influenza, High Dose Seasonal PF 10/28/2021   Influenza,inj,Quad PF,6+ Mos 11/07/2018   Influenza-Unspecified 10/25/2014, 11/13/2015, 10/21/2017, 11/27/2019, 11/15/2020   PFIZER(Purple Top)SARS-COV-2 Vaccination 09/02/2019, 09/23/2019   PNEUMOCOCCAL CONJUGATE-20 05/01/2021   Pneumococcal  Conjugate-13 11/07/2019   Pneumococcal Polysaccharide-23 12/25/2014   Tdap 11/07/2019   Zoster Recombinant(Shingrix) 06/29/2020, 08/29/2020, 10/01/2020    TDAP status: Up to date  Flu Vaccine status: Up to date  Pneumococcal vaccine status: Up to date  Covid-19 vaccine status: Completed vaccines  Qualifies for Shingles Vaccine? Yes   Zostavax completed Yes   Shingrix Completed?: Yes  Screening Tests Health Maintenance  Topic Date Due   COVID-19 Vaccine (3 - Pfizer risk series) 10/21/2019   INFLUENZA VACCINE  09/11/2022   HEMOGLOBIN A1C  11/07/2022   Diabetic kidney evaluation - eGFR measurement  01/07/2023   Diabetic kidney evaluation - Urine ACR  01/07/2023   FOOT EXAM  01/07/2023   OPHTHALMOLOGY EXAM  02/12/2023   MAMMOGRAM  08/13/2023   Medicare Annual Wellness (AWV)  09/05/2023   Fecal DNA (Cologuard)  10/04/2023   DTaP/Tdap/Td (2 - Td or Tdap) 11/06/2029   Pneumonia Vaccine 52+ Years old  Completed   DEXA SCAN  Completed   Hepatitis C Screening  Completed   Zoster Vaccines- Shingrix  Completed   HPV VACCINES  Aged Out    Health Maintenance  Health Maintenance Due  Topic Date Due   COVID-19 Vaccine (3 - Pfizer risk series) 10/21/2019    Colorectal cancer screening: Type of screening: Cologuard. Completed yes. Repeat every 3 years  Mammogram status: Completed yes. Repeat every year  Bone Density status: Completed yes. Results reflect: Bone density results: NORMAL. Repeat every 5 years.  Lung Cancer Screening: (Low Dose CT Chest recommended if Age 70-80 years, 20 pack-year currently smoking OR have quit w/in 15years.) does qualify.   Lung Cancer Screening Referral: yes pt declines  Additional Screening:  Hepatitis C Screening: does not qualify; Completed yes  Vision Screening: Recommended annual ophthalmology exams for early detection of glaucoma and other disorders of the eye. Is the patient up to date with their annual eye exam?  Yes  Who is the  provider or what is the name of the office in which the patient attends annual eye exams? [facility] If pt is not established with a provider, would they like to be referred to a provider to establish care? No .   Dental Screening: Recommended annual dental exams for proper oral hygiene  Diabetic Foot Exam: Diabetic Foot Exam: Completed yes  Community Resource Referral / Chronic Care Management: CRR required this visit?  No   CCM required this visit?  No    Plan:     I have personally reviewed and noted the following in the patient's chart:   Medical and social history Use of alcohol, tobacco or illicit drugs  Current medications and supplements including opioid prescriptions. Patient is not currently taking opioid prescriptions. Functional ability and status Nutritional status Physical activity Advanced directives List of other physicians Hospitalizations, surgeries, and ER visits in previous 12 months Vitals Screenings to include cognitive, depression, and falls Referrals and appointments  In addition, I have reviewed and discussed with patient  certain preventive protocols, quality metrics, and best practice recommendations. A written personalized care plan for preventive services as well as general preventive health recommendations were provided to patient.     Sue Lush, LPN   07/31/3084   After Visit Summary: (MyChart) Due to this being a telephonic visit, the after visit summary with patients personalized plan was offered to patient via MyChart   Nurse Notes: The patient states she is doing well and has no concerns or questions at this time.

## 2022-09-05 NOTE — Progress Notes (Unsigned)
Name: Tracey Tucker   MRN: [id number]    DOB: [birth year]   Date:09/08/2022       Progress Note  Subjective  Chief Complaint  Follow Up  HPI  DMII: She is currently taking Ozempic 2 mg,  Xigduo XR and Glipizide 10 mg also on Actos and she resumed a diabetic diet, A1C down from 7.2 % to 7.1 %, 6.9 % ,7 % down to 6.7 %, 7.3 % and today is 7 %    She has not been checking her glucose lately.  She is still not drinking sweet beverages, she is also walking daily and not sure why A1C went up.   She denies polyphagia, polydipsia and polyuria but has nocturia - she drinks a lot of water on the way home from home She has dyslipidemia, CKI, HTN. She is on rosuvastatin SGL-2 agonist and ARB.     HTN:  Denies any episodes of chest pain,  palpitation or SOB.  Denies dizziness.  BP is towards low end of normal today   Osteopenia: discussed high calcium diet and take more vitamin D    Dyslipidemia: she is on statin therapy, last LDL was at goal down to 69 . We will recheck next visit    Obesity: BMI is now below 35.   She retired  from full time job in 09/2021. She has a part time job at the [employer], working 3 hours a day  a couple days a week. She has been walking for one hour every morning. She drinks water or diet sodas. Cooking at home    Chronic bronchitis: she has been smoking for over 30 years, she was never a heavy smoker since she dips snuff. She is currently smoking about half pack day,  she has a wet cough that is usually worse during activity. No SOB or wheezing   CKI stage III: she stopped NSAID's  and is taking  ARB and SGL-2 agonist and GFR normalized . No side effects Last urine micro was normal No pruritus and she has good urine output   Vertigo: spinning sensation when she goes to bed at night, no nausea or vomiting or hearing loss, denies tinnitus. No recent episodes   Patient Active Problem List   Diagnosis Date Noted   Simple chronic bronchitis (HCC) 01/02/2021   Stage 3a  chronic kidney disease (HCC) 01/02/2021   Vitamin D deficiency 01/02/2021   Dyslipidemia 01/02/2021   Tobacco abuse counseling 10/22/2017   Arthritis of left knee 09/18/2015   Hypertension associated with diabetes (HCC) 08/22/2014   Hyperlipidemia associated with type 2 diabetes mellitus (HCC) 08/22/2014    Past Surgical History:  Procedure Laterality Date   CATARACT EXTRACTION Left 05/17/14   [facility] [place]   CATARACT EXTRACTION W/PHACO Right 07/26/2014   Procedure: CATARACT EXTRACTION PHACO AND INTRAOCULAR LENS PLACEMENT (IOC);  Surgeon: Lockie Mola, MD;  [facility]: [facility];  Service: Ophthalmology;  Laterality: Right;  DIABETIC   CESAREAN SECTION     FOOT SURGERY  2014    Family History  Problem Relation Age of Onset   Diabetes Father    Heart disease Father    Diabetes Sister    Hyperlipidemia Sister    Hypertension Sister    Hypertension Brother    Diabetes Brother    Diabetes Sister    Kidney disease Sister    Heart attack Sister    Diabetes Sister    Diabetes Sister    Diabetes Brother  Stomach cancer Brother    Stroke Brother    Breast cancer Neg Hx     Social History   Tobacco Use   Smoking status: Every Day    Current packs/day: 0.25    Average packs/day: 0.3 packs/day for 46.7 years (11.7 ttl pk-yrs)    Types: Cigarettes    Start date: 12/28/1975   Smokeless tobacco: Current    Types: Snuff  Substance Use Topics   Alcohol use: No    Alcohol/week: 0.0 standard drinks of alcohol     Current Outpatient Medications:    aspirin 81 MG tablet, Take 81 mg by mouth daily. PM, Disp: , Rfl:    Blood Glucose Monitoring Suppl (FREESTYLE LITE) DEVI, USE AS DIRECTED, Disp: 1 each, Rfl: 0   Cholecalciferol (VITAMIN D) 2000 units CAPS, Take 1 capsule (2,000 Units total) by mouth daily., Disp: 90 capsule, Rfl: 1   Dapagliflozin Pro-metFORMIN ER (XIGDUO XR) 11-998 MG TB24, Take 1 tablet by mouth daily at 12 noon., Disp: 90 tablet, Rfl: 1    FREESTYLE LITE test strip, CHECK SUGARS ONCE A DAY, Disp: 100 strip, Rfl: 2   glipiZIDE (GLUCOTROL XL) 10 MG 24 hr tablet, Take 1 tablet by mouth daily with breakfast., Disp: 90 tablet, Rfl: 0   pioglitazone (ACTOS) 15 MG tablet, Take 1 tablet (15 mg total) by mouth daily., Disp: 90 tablet, Rfl: 0   propranolol (INDERAL) 10 MG tablet, TAKE 1 TABLET BY MOUTH 2 (TWO) TIMES DAILY., Disp: 180 tablet, Rfl: 1   rosuvastatin (CRESTOR) 40 MG tablet, Take 1 tablet (40 mg total) by mouth daily., Disp: 90 tablet, Rfl: 0   Semaglutide, 2 MG/DOSE, (OZEMPIC, 2 MG/DOSE,) 8 MG/3ML SOPN, Inject 2 mg as directed once a week., Disp: 12 mL, Rfl: 3   Semaglutide, 2 MG/DOSE, 8 MG/3ML SOPN, Inject 2 mg as directed once a week., Disp: 9 mL, Rfl: 0   tiotropium (SPIRIVA HANDIHALER) 18 MCG inhalation capsule, Place 1 capsule (18 mcg total) into inhaler and inhale daily., Disp: 30 capsule, Rfl: 5   valsartan-hydrochlorothiazide (DIOVAN-HCT) 160-12.5 MG tablet, Take 1 tablet by mouth daily., Disp: 90 tablet, Rfl: 0  No Known Allergies  I personally reviewed active problem list, medication list, allergies, family history, social history, health maintenance with the patient/caregiver today.   ROS  Constitutional: Negative for fever or weight change.  Respiratory: Negative for cough and shortness of breath.   Cardiovascular: Negative for chest pain or palpitations.  Gastrointestinal: Negative for abdominal pain, no bowel changes.  Musculoskeletal: Negative for gait problem or joint swelling.  Skin: Negative for rash.  Neurological: Negative for dizziness or headache.  No other specific complaints in a complete review of systems (except as listed in HPI above).   Objective  Vitals:   09/08/22 0909  BP: 122/70  Pulse: 90  Resp: 16  SpO2: 96%  Weight: 172 lb (78 kg)  Height: 4\' 11"  (1.499 m)    Body mass index is 34.74 kg/m.  Physical Exam  Constitutional: Patient appears well-developed and well-nourished.  Obese  No distress.  HEENT: head atraumatic, normocephalic, pupils equal and reactive to light, neck supple Cardiovascular: Normal rate, regular rhythm and normal heart sounds.  No murmur heard. No BLE edema. Pulmonary/Chest: Effort normal and breath sounds normal. No respiratory distress. Abdominal: Soft.  There is no tenderness. Psychiatric: Patient has a normal mood and affect. behavior is normal. Judgment and thought content normal.   PHQ2/9:    09/08/2022    9:10 AM 09/05/2022  9:31 AM 05/07/2022    9:52 AM 01/06/2022    9:00 AM 09/02/2021   11:02 AM  Depression screen PHQ 2/9  Decreased Interest 0 0 0 0 0  Down, Depressed, Hopeless 0 0 0 0 0  PHQ - 2 Score 0 0 0 0 0  Altered sleeping 0  0 0 0  Tired, decreased energy 0  0 0 0  Change in appetite 0  0 0 0  Feeling bad or failure about yourself  0  0 0 0  Trouble concentrating 0  0 0 0  Moving slowly or fidgety/restless 0  0 0 0  Suicidal thoughts 0  0 0 0  PHQ-9 Score 0  0 0 0    phq 9 is negative   Fall Risk:    09/08/2022    9:10 AM 09/05/2022    9:27 AM 05/07/2022    9:52 AM 01/06/2022    8:59 AM 09/02/2021   11:02 AM  Fall Risk   Falls in the past year? 0 0 0 1 0  Number falls in past yr: 0 0  1 0  Injury with Fall? 0 0  0 0  Risk for fall due to : No Fall Risks No Fall Risks No Fall Risks No Fall Risks No Fall Risks  Follow up Falls prevention discussed Education provided;Falls prevention discussed Falls prevention discussed Falls prevention discussed;Education provided;Falls evaluation completed Falls prevention discussed      Functional Status Survey: Is the patient deaf or have difficulty hearing?: No Does the patient have difficulty seeing, even when wearing glasses/contacts?: No Does the patient have difficulty concentrating, remembering, or making decisions?: No Does the patient have difficulty walking or climbing stairs?: No Does the patient have difficulty dressing or bathing?: No Does the patient  have difficulty doing errands alone such as visiting a doctor's office or shopping?: No    Assessment & Plan  1. Hyperlipidemia associated with type 2 diabetes mellitus (HCC)  - POCT HgB A1C - rosuvastatin (CRESTOR) 40 MG tablet; Take 1 tablet (40 mg total) by mouth daily.  Dispense: 90 tablet; Refill: 0 - pioglitazone (ACTOS) 15 MG tablet; Take 1 tablet (15 mg total) by mouth daily.  Dispense: 90 tablet; Refill: 1 - glipiZIDE (GLUCOTROL XL) 10 MG 24 hr tablet; Take 1 tablet by mouth daily with breakfast.  Dispense: 90 tablet; Refill: 0  2. Simple chronic bronchitis (HCC)  Not ready to quit smoking  3. Stage 3a chronic kidney disease (HCC)  On ARB and SGL-2 agonist   4. Hypertension associated with diabetes (HCC)  BP is at goal   5. Osteopenia after menopause  Low frax, discussed high calcium diet, and vitamin D   6. Vitamin D deficiency  Continue supplementations  7. Obesity (BMI 30.0-34.9)  Doing well, losing weight   8. Essential hypertension  - propranolol (INDERAL) 10 MG tablet; TAKE 1 TABLET BY MOUTH 2 (TWO) TIMES DAILY.  Dispense: 180 tablet; Refill: 1 - valsartan-hydrochlorothiazide (DIOVAN-HCT) 160-12.5 MG tablet; Take 1 tablet by mouth daily.  Dispense: 90 tablet; Refill: 1  9. Type 2 diabetes mellitus with obesity (HCC)  - Dapagliflozin Pro-metFORMIN ER (XIGDUO XR) 11-998 MG TB24; Take 1 tablet by mouth daily with breakfast.  Dispense: 90 tablet; Refill: 1

## 2022-09-05 NOTE — Patient Instructions (Signed)
Tracey Tucker , Thank you for taking time to come for your Medicare Wellness Visit. I appreciate your ongoing commitment to your health goals. Please review the following plan we discussed and let me know if I can assist you in the future.   Referrals/Orders/Follow-Ups/Clinician Recommendations: none  This is a list of the screening recommended for you and due dates:  Health Maintenance  Topic Date Due   COVID-19 Vaccine (3 - Pfizer risk series) 10/21/2019   Flu Shot  09/11/2022   Hemoglobin A1C  11/07/2022   Yearly kidney function blood test for diabetes  01/07/2023   Yearly kidney health urinalysis for diabetes  01/07/2023   Complete foot exam   01/07/2023   Eye exam for diabetics  02/12/2023   Mammogram  08/13/2023   Medicare Annual Wellness Visit  09/05/2023   Cologuard (Stool DNA test)  10/04/2023   DTaP/Tdap/Td vaccine (2 - Td or Tdap) 11/06/2029   Pneumonia Vaccine  Completed   DEXA scan (bone density measurement)  Completed   Hepatitis C Screening  Completed   Zoster (Shingles) Vaccine  Completed   HPV Vaccine  Aged Out    Advanced directives: (ACP Link)Information on Advanced Care Planning can be found at [facility] [place] Advance Health Care Directives Advance Health Care Directives ([email or web address]   Next Medicare Annual Wellness Visit scheduled for next year: Yes 09/10/23  Preventive Care 65 Years and Older, Female Preventive care refers to lifestyle choices and visits with your health care provider that can promote health and wellness. What does preventive care include? A yearly physical exam. This is also called an annual well check. Dental exams once or twice a year. Routine eye exams. Ask your health care provider how often you should have your eyes checked. Personal lifestyle choices, including: Daily care of your teeth and gums. Regular physical activity. Eating a healthy diet. Avoiding tobacco and drug use. Limiting alcohol use. Practicing safe  sex. Taking low-dose aspirin every day. Taking vitamin and mineral supplements as recommended by your health care provider. What happens during an annual well check? The services and screenings done by your health care provider during your annual well check will depend on your age, overall health, lifestyle risk factors, and family history of disease. Counseling  Your health care provider may ask you questions about your: Alcohol use. Tobacco use. Drug use. Emotional well-being. Home and relationship well-being. Sexual activity. Eating habits. History of falls. Memory and ability to understand (cognition). Work and work Astronomer. Reproductive health. Screening  You may have the following tests or measurements: Height, weight, and BMI. Blood pressure. Lipid and cholesterol levels. These may be checked every 5 years, or more frequently if you are over 73 years old. Skin check. Lung cancer screening. You may have this screening every year starting at age 26 if you have a 30-pack-year history of smoking and currently smoke or have quit within the past 15 years. Fecal occult blood test (FOBT) of the stool. You may have this test every year starting at age 70. Flexible sigmoidoscopy or colonoscopy. You may have a sigmoidoscopy every 5 years or a colonoscopy every 10 years starting at age 28. Hepatitis C blood test. Hepatitis B blood test. Sexually transmitted disease (STD) testing. Diabetes screening. This is done by checking your blood sugar (glucose) after you have not eaten for a while (fasting). You may have this done every 1-3 years. Bone density scan. This is done to screen for osteoporosis. You may have this done  starting at age 61. Mammogram. This may be done every 1-2 years. Talk to your health care provider about how often you should have regular mammograms. Talk with your health care provider about your test results, treatment options, and if necessary, the need for more  tests. Vaccines  Your health care provider may recommend certain vaccines, such as: Influenza vaccine. This is recommended every year. Tetanus, diphtheria, and acellular pertussis (Tdap, Td) vaccine. You may need a Td booster every 10 years. Zoster vaccine. You may need this after age 16. Pneumococcal 13-valent conjugate (PCV13) vaccine. One dose is recommended after age 86. Pneumococcal polysaccharide (PPSV23) vaccine. One dose is recommended after age 50. Talk to your health care provider about which screenings and vaccines you need and how often you need them. This information is not intended to replace advice given to you by your health care provider. Make sure you discuss any questions you have with your health care provider. Document Released: 02/23/2015 Document Revised: 10/17/2015 Document Reviewed: 11/28/2014 Elsevier Interactive Patient Education  2017 [employer].  Fall Prevention in the Home Falls can cause injuries. They can happen to people of all ages. There are many things you can do to make your home safe and to help prevent falls. What can I do on the outside of my home? Regularly fix the edges of walkways and driveways and fix any cracks. Remove anything that might make you trip as you walk through a door, such as a raised step or threshold. Trim any bushes or trees on the path to your home. Use bright outdoor lighting. Clear any walking paths of anything that might make someone trip, such as rocks or tools. Regularly check to see if handrails are loose or broken. Make sure that both sides of any steps have handrails. Any raised decks and porches should have guardrails on the edges. Have any leaves, snow, or ice cleared regularly. Use sand or salt on walking paths during winter. Clean up any spills in your garage right away. This includes oil or grease spills. What can I do in the bathroom? Use night lights. Install grab bars by the toilet and in the tub and shower.  Do not use towel bars as grab bars. Use non-skid mats or decals in the tub or shower. If you need to sit down in the shower, use a plastic, non-slip stool. Keep the floor dry. Clean up any water that spills on the floor as soon as it happens. Remove soap buildup in the tub or shower regularly. Attach bath mats securely with double-sided non-slip rug tape. Do not have throw rugs and other things on the floor that can make you trip. What can I do in the bedroom? Use night lights. Make sure that you have a light by your bed that is easy to reach. Do not use any sheets or blankets that are too big for your bed. They should not hang down onto the floor. Have a firm chair that has side arms. You can use this for support while you get dressed. Do not have throw rugs and other things on the floor that can make you trip. What can I do in the kitchen? Clean up any spills right away. Avoid walking on wet floors. Keep items that you use a lot in easy-to-reach places. If you need to reach something above you, use a strong step stool that has a grab bar. Keep electrical cords out of the way. Do not use floor polish or wax that  makes floors slippery. If you must use wax, use non-skid floor wax. Do not have throw rugs and other things on the floor that can make you trip. What can I do with my stairs? Do not leave any items on the stairs. Make sure that there are handrails on both sides of the stairs and use them. Fix handrails that are broken or loose. Make sure that handrails are as long as the stairways. Check any carpeting to make sure that it is firmly attached to the stairs. Fix any carpet that is loose or worn. Avoid having throw rugs at the top or bottom of the stairs. If you do have throw rugs, attach them to the floor with carpet tape. Make sure that you have a light switch at the top of the stairs and the bottom of the stairs. If you do not have them, ask someone to add them for you. What else  can I do to help prevent falls? Wear shoes that: Do not have high heels. Have rubber bottoms. Are comfortable and fit you well. Are closed at the toe. Do not wear sandals. If you use a stepladder: Make sure that it is fully opened. Do not climb a closed stepladder. Make sure that both sides of the stepladder are locked into place. Ask someone to hold it for you, if possible. Clearly mark and make sure that you can see: Any grab bars or handrails. First and last steps. Where the edge of each step is. Use tools that help you move around (mobility aids) if they are needed. These include: Canes. Walkers. Scooters. Crutches. Turn on the lights when you go into a dark area. Replace any light bulbs as soon as they burn out. Set up your furniture so you have a clear path. Avoid moving your furniture around. If any of your floors are uneven, fix them. If there are any pets around you, be aware of where they are. Review your medicines with your doctor. Some medicines can make you feel dizzy. This can increase your chance of falling. Ask your doctor what other things that you can do to help prevent falls. This information is not intended to replace advice given to you by your health care provider. Make sure you discuss any questions you have with your health care provider. Document Released: 11/23/2008 Document Revised: 07/05/2015 Document Reviewed: 03/03/2014 Elsevier Interactive Patient Education  2017 [employer].

## 2022-09-08 ENCOUNTER — Other Ambulatory Visit: Payer: Self-pay

## 2022-09-08 ENCOUNTER — Encounter: Payer: Self-pay | Admitting: Family Medicine

## 2022-09-08 ENCOUNTER — Ambulatory Visit (INDEPENDENT_AMBULATORY_CARE_PROVIDER_SITE_OTHER): Payer: PPO | Admitting: Family Medicine

## 2022-09-08 VITALS — BP 122/70 | HR 90 | Resp 16 | Ht 59.0 in | Wt 172.0 lb

## 2022-09-08 DIAGNOSIS — E669 Obesity, unspecified: Secondary | ICD-10-CM | POA: Diagnosis not present

## 2022-09-08 DIAGNOSIS — E785 Hyperlipidemia, unspecified: Secondary | ICD-10-CM

## 2022-09-08 DIAGNOSIS — M8580 Other specified disorders of bone density and structure, unspecified site: Secondary | ICD-10-CM | POA: Diagnosis not present

## 2022-09-08 DIAGNOSIS — Z780 Asymptomatic menopausal state: Secondary | ICD-10-CM

## 2022-09-08 DIAGNOSIS — N1831 Chronic kidney disease, stage 3a: Secondary | ICD-10-CM

## 2022-09-08 DIAGNOSIS — E559 Vitamin D deficiency, unspecified: Secondary | ICD-10-CM

## 2022-09-08 DIAGNOSIS — E1169 Type 2 diabetes mellitus with other specified complication: Secondary | ICD-10-CM

## 2022-09-08 DIAGNOSIS — J410 Simple chronic bronchitis: Secondary | ICD-10-CM | POA: Diagnosis not present

## 2022-09-08 DIAGNOSIS — E66811 Obesity, class 1: Secondary | ICD-10-CM

## 2022-09-08 DIAGNOSIS — I10 Essential (primary) hypertension: Secondary | ICD-10-CM

## 2022-09-08 DIAGNOSIS — E1159 Type 2 diabetes mellitus with other circulatory complications: Secondary | ICD-10-CM | POA: Diagnosis not present

## 2022-09-08 DIAGNOSIS — I152 Hypertension secondary to endocrine disorders: Secondary | ICD-10-CM | POA: Diagnosis not present

## 2022-09-08 LAB — POCT GLYCOSYLATED HEMOGLOBIN (HGB A1C): Hemoglobin A1C: 7 % — AB (ref 4.0–5.6)

## 2022-09-08 MED ORDER — PROPRANOLOL HCL 10 MG PO TABS
ORAL_TABLET | Freq: Two times a day (BID) | ORAL | 1 refills | Status: DC
Start: 2022-09-08 — End: 2023-01-14
  Filled 2022-09-08: qty 180, 90d supply, fill #0
  Filled 2022-12-15: qty 180, 90d supply, fill #1

## 2022-09-08 MED ORDER — DAPAGLIFLOZIN PRO-METFORMIN ER 10-1000 MG PO TB24
1.0000 | ORAL_TABLET | Freq: Every day | ORAL | 1 refills | Status: DC
Start: 2022-09-08 — End: 2023-01-14
  Filled 2022-09-08: qty 90, 90d supply, fill #0

## 2022-09-08 MED ORDER — ROSUVASTATIN CALCIUM 40 MG PO TABS
40.0000 mg | ORAL_TABLET | Freq: Every day | ORAL | 0 refills | Status: DC
Start: 2022-09-08 — End: 2023-01-14
  Filled 2022-09-08 – 2022-11-08 (×2): qty 90, 90d supply, fill #0

## 2022-09-08 MED ORDER — GLIPIZIDE ER 10 MG PO TB24
ORAL_TABLET | Freq: Every day | ORAL | 0 refills | Status: DC
Start: 2022-09-08 — End: 2023-02-03
  Filled 2022-09-08 – 2022-11-08 (×2): qty 90, 90d supply, fill #0

## 2022-09-08 MED ORDER — VALSARTAN-HYDROCHLOROTHIAZIDE 160-12.5 MG PO TABS
1.0000 | ORAL_TABLET | Freq: Every day | ORAL | 1 refills | Status: DC
Start: 2022-09-08 — End: 2023-01-14
  Filled 2022-09-08: qty 90, 90d supply, fill #0
  Filled 2022-12-31: qty 90, 90d supply, fill #1

## 2022-09-08 MED ORDER — PIOGLITAZONE HCL 15 MG PO TABS
15.0000 mg | ORAL_TABLET | Freq: Every day | ORAL | 1 refills | Status: DC
Start: 2022-09-08 — End: 2023-01-14
  Filled 2022-09-08 – 2022-10-24 (×2): qty 90, 90d supply, fill #0

## 2022-09-10 ENCOUNTER — Other Ambulatory Visit: Payer: Self-pay

## 2022-09-29 ENCOUNTER — Other Ambulatory Visit: Payer: Self-pay

## 2022-10-01 ENCOUNTER — Other Ambulatory Visit: Payer: Self-pay

## 2022-10-20 DIAGNOSIS — S90211A Contusion of right great toe with damage to nail, initial encounter: Secondary | ICD-10-CM | POA: Diagnosis not present

## 2022-10-20 DIAGNOSIS — L601 Onycholysis: Secondary | ICD-10-CM | POA: Diagnosis not present

## 2022-10-24 ENCOUNTER — Other Ambulatory Visit: Payer: Self-pay

## 2022-11-09 ENCOUNTER — Other Ambulatory Visit: Payer: Self-pay

## 2022-12-09 ENCOUNTER — Ambulatory Visit (INDEPENDENT_AMBULATORY_CARE_PROVIDER_SITE_OTHER): Payer: PPO

## 2022-12-09 DIAGNOSIS — Z23 Encounter for immunization: Secondary | ICD-10-CM

## 2022-12-16 ENCOUNTER — Other Ambulatory Visit: Payer: Self-pay

## 2022-12-17 ENCOUNTER — Other Ambulatory Visit: Payer: Self-pay

## 2022-12-25 ENCOUNTER — Other Ambulatory Visit: Payer: Self-pay

## 2022-12-26 ENCOUNTER — Other Ambulatory Visit: Payer: Self-pay

## 2023-01-06 ENCOUNTER — Ambulatory Visit: Payer: PPO | Admitting: Family Medicine

## 2023-01-07 NOTE — Progress Notes (Signed)
Name: Tracey Tucker   MRN: [id number]    DOB: 1955-08-22   Date:01/14/2023       Progress Note  Subjective  Chief Complaint  Chief Complaint  Patient presents with   Follow-up   Hypertension   Hyperlipidemia   Diabetes    HPI  Discussed the use of AI scribe software for clinical note transcription with the patient, who gave verbal consent to proceed.  History of Present Illness   The patient, with a history of diabetes, hypertension, high cholesterol, and chronic kidney disease, presents for a routine follow-up. She reports no new health issues since the last visit. Her most recent A1c was 6.6, down from 7.0, indicating good control of her diabetes. She is currently on pioglitazone, Xigduo,  Ozempic and glipizide  for diabetes management, and valsartan plus SGL-2 agonist for hypertension and kidney protection. Statin therapy for dyslipidemia  The patient has been experiencing dryness and thickening of the nails on her feet, which she attributes to her diabetes. She reports no issues with sensation in her feet, as confirmed by a foot exam.  She has also been experiencing nocturia, but denies any other urinary symptoms. She has a history of proteinuria, which has since normalized. She denies any symptoms of vertigo, a past issue, and reports no chest pain or palpitations.  The patient has a long history of smoking and continues to smoke about six cigarettes a day, but denies any shortness of breath or wheezing. She also has a history of chronic bronchitis and is currently on Spiriva. She reports coughing mainly at night, with no significant morning expectoration.  She also has a history of osteopenia post-menopause but is not currently on any medications for it.   The patient has been making dietary changes to manage her diabetes, including cutting down on carbohydrates and sweet drinks. She reports no symptoms of hyperglycemia or hypoglycemia. She is interested in potentially reducing  her glipizide dosage due to her successful weight loss and good control of her diabetes if A1C remains under good control by next visit.         Patient Active Problem List   Diagnosis Date Noted   Simple chronic bronchitis (HCC) 01/02/2021   Vitamin D deficiency 01/02/2021   Dyslipidemia 01/02/2021   Tobacco abuse counseling 10/22/2017   Arthritis of left knee 09/18/2015   Hypertension associated with diabetes (HCC) 08/22/2014   Hyperlipidemia associated with type 2 diabetes mellitus (HCC) 08/22/2014    Past Surgical History:  Procedure Laterality Date   CATARACT EXTRACTION Left 05/17/14   [facility] [place]   CATARACT EXTRACTION W/PHACO Right 07/26/2014   Procedure: CATARACT EXTRACTION PHACO AND INTRAOCULAR LENS PLACEMENT (IOC);  Surgeon: Lockie Mola, MD;  Location: [facility];  Service: Ophthalmology;  Laterality: Right;  DIABETIC   CESAREAN SECTION     FOOT SURGERY  2014    Family History  Problem Relation Age of Onset   Diabetes Father    Heart disease Father    Diabetes Sister    Hyperlipidemia Sister    Hypertension Sister    Hypertension Brother    Diabetes Brother    Diabetes Sister    Kidney disease Sister    Heart attack Sister    Diabetes Sister    Diabetes Sister    Diabetes Brother    Stomach cancer Brother    Stroke Brother    Breast cancer Neg Hx     Social History   Tobacco Use   Smoking status:  Every Day    Current packs/day: 0.25    Average packs/day: 0.3 packs/day for 47.0 years (11.8 ttl pk-yrs)    Types: Cigarettes    Start date: 12/28/1975   Smokeless tobacco: Current    Types: Snuff  Substance Use Topics   Alcohol use: No    Alcohol/week: 0.0 standard drinks of alcohol     Current Outpatient Medications:    aspirin 81 MG tablet, Take 81 mg by mouth daily. PM, Disp: , Rfl:    Blood Glucose Monitoring Suppl (FREESTYLE LITE) DEVI, USE AS DIRECTED, Disp: 1 each, Rfl: 0   Cholecalciferol (VITAMIN D) 2000 units CAPS, Take  1 capsule (2,000 Units total) by mouth daily., Disp: 90 capsule, Rfl: 1   FREESTYLE LITE test strip, CHECK SUGARS ONCE A DAY, Disp: 100 strip, Rfl: 2   glipiZIDE (GLUCOTROL XL) 10 MG 24 hr tablet, Take 1 tablet by mouth daily with breakfast., Disp: 90 tablet, Rfl: 0   Semaglutide, 2 MG/DOSE, (OZEMPIC, 2 MG/DOSE,) 8 MG/3ML SOPN, Inject 2 mg as directed once a week., Disp: 12 mL, Rfl: 3   Dapagliflozin Pro-metFORMIN ER (XIGDUO XR) 11-998 MG TB24, Take 1 tablet by mouth daily with breakfast., Disp: 90 tablet, Rfl: 0   pioglitazone (ACTOS) 15 MG tablet, Take 1 tablet (15 mg total) by mouth daily., Disp: 90 tablet, Rfl: 1   propranolol (INDERAL) 10 MG tablet, Take 1 tablet (10 mg total) by mouth 2 (two) times daily., Disp: 180 tablet, Rfl: 1   rosuvastatin (CRESTOR) 40 MG tablet, Take 1 tablet (40 mg total) by mouth daily., Disp: 90 tablet, Rfl: 1   tiotropium (SPIRIVA HANDIHALER) 18 MCG inhalation capsule, Place 1 capsule (18 mcg total) into inhaler and inhale daily., Disp: 30 capsule, Rfl: 5   valsartan-hydrochlorothiazide (DIOVAN-HCT) 160-12.5 MG tablet, Take 1 tablet by mouth daily., Disp: 90 tablet, Rfl: 1  No Known Allergies  I personally reviewed active problem list, medication list, allergies, family history with the patient/caregiver today.   ROS  Ten systems reviewed and is negative except as mentioned in HPI    Objective  Vitals:   01/14/23 1402  BP: 134/68  Pulse: 96  Resp: 18  Temp: 98.2 F (36.8 C)  SpO2: 99%  Weight: 167 lb 6.4 oz (75.9 kg)  Height: 4\' 11"  (1.499 m)    Body mass index is 33.81 kg/m.  Physical Exam  Constitutional: Patient appears well-developed and well-nourished. Obese  No distress.  HEENT: head atraumatic, normocephalic, pupils equal and reactive to light, neck supple Cardiovascular: Normal rate, regular rhythm and normal heart sounds.  No murmur heard. No BLE edema. Pulmonary/Chest: Effort normal and breath sounds normal. No respiratory  distress. Abdominal: Soft.  There is no tenderness. Psychiatric: Patient has a normal mood and affect. behavior is normal. Judgment and thought content normal.   Diabetic Foot Exam: Diabetic Foot Exam - Simple   Simple Foot Form Visual Inspection See comments: Yes Sensation Testing Intact to touch and monofilament testing bilaterally: Yes Pulse Check Posterior Tibialis and Dorsalis pulse intact bilaterally: Yes Comments Dry skin       PHQ2/9:    01/14/2023    2:10 PM 09/08/2022    9:10 AM 09/05/2022    9:31 AM 05/07/2022    9:52 AM 01/06/2022    9:00 AM  Depression screen PHQ 2/9  Decreased Interest 0 0 0 0 0  Down, Depressed, Hopeless 0 0 0 0 0  PHQ - 2 Score 0 0 0 0 0  Altered  sleeping 0 0  0 0  Tired, decreased energy 0 0  0 0  Change in appetite 0 0  0 0  Feeling bad or failure about yourself  0 0  0 0  Trouble concentrating 0 0  0 0  Moving slowly or fidgety/restless 0 0  0 0  Suicidal thoughts 0 0  0 0  PHQ-9 Score 0 0  0 0  Difficult doing work/chores Not difficult at all        phq 9 is negative   Fall Risk:    01/14/2023    2:09 PM 09/08/2022    9:10 AM 09/05/2022    9:27 AM 05/07/2022    9:52 AM 01/06/2022    8:59 AM  Fall Risk   Falls in the past year? 0 0 0 0 1  Number falls in past yr: 0 0 0  1  Injury with Fall? 0 0 0  0  Risk for fall due to :  No Fall Risks No Fall Risks No Fall Risks No Fall Risks  Follow up  Falls prevention discussed Education provided;Falls prevention discussed Falls prevention discussed Falls prevention discussed;Education provided;Falls evaluation completed     Functional Status Survey: Is the patient deaf or have difficulty hearing?: No Does the patient have difficulty seeing, even when wearing glasses/contacts?: No Does the patient have difficulty concentrating, remembering, or making decisions?: No Does the patient have difficulty walking or climbing stairs?: No Does the patient have difficulty dressing or bathing?:  No Does the patient have difficulty doing errands alone such as visiting a doctor's office or shopping?: No    Assessment & Plan  Assessment and Plan    Diabetes Mellitus Improved glycemic control with A1c down to 6.6 from 7.0. No symptoms of hypoglycemia reported. Patient is on pioglitazone, Xigduo, Ozempic, and glipizide. Discussed potential to decrease glipizide dose if A1c continues to improve. -Continue current medications. -Check A1c, CBC, urine protein, lipid panel, and vitamin D levels. -Consider reducing glipizide to 5mg  at next visit if A1c <6.5.  Chronic Bronchitis Patient continues to smoke and use snuff but denies shortness of breath or wheezing. Reports coughing mainly at night. -Continue Spiriva. -Encourage smoking cessation.  Hypertension Patient is on valsartan HCTZ and propranolol. No symptoms of hypotension reported. -Continue current medications.  Osteopenia Patient is not on any medications for osteopenia. -Encourage high calcium diet and vitamin D supplementation.  Foot Care in Diabetes Dryness and thick nails noted on foot exam. Patient has intact sensation. -Apply lotion to feet nightly and wear socks to prevent dryness and cracking. -Continue regular foot exams.  General Health Maintenance -Ensure up-to-date eye exam. -Follow-up in 4 months.

## 2023-01-12 ENCOUNTER — Other Ambulatory Visit: Payer: Self-pay

## 2023-01-14 ENCOUNTER — Ambulatory Visit (INDEPENDENT_AMBULATORY_CARE_PROVIDER_SITE_OTHER): Payer: PPO | Admitting: Family Medicine

## 2023-01-14 ENCOUNTER — Encounter: Payer: Self-pay | Admitting: Family Medicine

## 2023-01-14 ENCOUNTER — Other Ambulatory Visit: Payer: Self-pay

## 2023-01-14 VITALS — BP 134/68 | HR 96 | Temp 98.2°F | Resp 18 | Ht 59.0 in | Wt 167.4 lb

## 2023-01-14 DIAGNOSIS — N1831 Chronic kidney disease, stage 3a: Secondary | ICD-10-CM

## 2023-01-14 DIAGNOSIS — M8580 Other specified disorders of bone density and structure, unspecified site: Secondary | ICD-10-CM | POA: Diagnosis not present

## 2023-01-14 DIAGNOSIS — E785 Hyperlipidemia, unspecified: Secondary | ICD-10-CM

## 2023-01-14 DIAGNOSIS — E1159 Type 2 diabetes mellitus with other circulatory complications: Secondary | ICD-10-CM

## 2023-01-14 DIAGNOSIS — E1169 Type 2 diabetes mellitus with other specified complication: Secondary | ICD-10-CM

## 2023-01-14 DIAGNOSIS — Z7985 Long-term (current) use of injectable non-insulin antidiabetic drugs: Secondary | ICD-10-CM | POA: Diagnosis not present

## 2023-01-14 DIAGNOSIS — J410 Simple chronic bronchitis: Secondary | ICD-10-CM | POA: Diagnosis not present

## 2023-01-14 DIAGNOSIS — Z780 Asymptomatic menopausal state: Secondary | ICD-10-CM | POA: Diagnosis not present

## 2023-01-14 DIAGNOSIS — Z79899 Other long term (current) drug therapy: Secondary | ICD-10-CM | POA: Diagnosis not present

## 2023-01-14 DIAGNOSIS — Z7984 Long term (current) use of oral hypoglycemic drugs: Secondary | ICD-10-CM | POA: Diagnosis not present

## 2023-01-14 DIAGNOSIS — E559 Vitamin D deficiency, unspecified: Secondary | ICD-10-CM | POA: Diagnosis not present

## 2023-01-14 DIAGNOSIS — I152 Hypertension secondary to endocrine disorders: Secondary | ICD-10-CM

## 2023-01-14 DIAGNOSIS — I10 Essential (primary) hypertension: Secondary | ICD-10-CM

## 2023-01-14 LAB — POCT GLYCOSYLATED HEMOGLOBIN (HGB A1C): Hemoglobin A1C: 6.6 % — AB (ref 4.0–5.6)

## 2023-01-14 MED ORDER — TIOTROPIUM BROMIDE MONOHYDRATE 18 MCG IN CAPS
1.0000 | ORAL_CAPSULE | Freq: Every day | RESPIRATORY_TRACT | 5 refills | Status: DC
Start: 2023-01-14 — End: 2023-10-27
  Filled 2023-01-14 – 2023-06-15 (×2): qty 30, 30d supply, fill #0

## 2023-01-14 MED ORDER — DAPAGLIFLOZIN PRO-METFORMIN ER 10-1000 MG PO TB24
1.0000 | ORAL_TABLET | Freq: Every day | ORAL | 0 refills | Status: AC
Start: 2023-01-14 — End: ?
  Filled 2023-01-14: qty 90, 90d supply, fill #0

## 2023-01-14 MED ORDER — PROPRANOLOL HCL 10 MG PO TABS
10.0000 mg | ORAL_TABLET | Freq: Two times a day (BID) | ORAL | 1 refills | Status: DC
Start: 2023-01-14 — End: 2023-06-23
  Filled 2023-01-14 – 2023-03-16 (×4): qty 180, 90d supply, fill #0
  Filled 2023-06-15: qty 180, 90d supply, fill #1

## 2023-01-14 MED ORDER — ROSUVASTATIN CALCIUM 40 MG PO TABS
40.0000 mg | ORAL_TABLET | Freq: Every day | ORAL | 1 refills | Status: DC
Start: 2023-01-14 — End: 2023-06-23
  Filled 2023-01-14 – 2023-02-05 (×2): qty 90, 90d supply, fill #0
  Filled 2023-05-06: qty 90, 90d supply, fill #1

## 2023-01-14 MED ORDER — VALSARTAN-HYDROCHLOROTHIAZIDE 160-12.5 MG PO TABS
1.0000 | ORAL_TABLET | Freq: Every day | ORAL | 1 refills | Status: DC
Start: 2023-01-14 — End: 2023-06-23
  Filled 2023-01-14 – 2023-03-30 (×2): qty 90, 90d supply, fill #0

## 2023-01-14 MED ORDER — PIOGLITAZONE HCL 15 MG PO TABS
15.0000 mg | ORAL_TABLET | Freq: Every day | ORAL | 1 refills | Status: DC
Start: 2023-01-14 — End: 2023-06-23
  Filled 2023-01-14: qty 90, 90d supply, fill #0
  Filled 2023-04-22: qty 90, 90d supply, fill #1

## 2023-01-15 ENCOUNTER — Other Ambulatory Visit: Payer: Self-pay

## 2023-01-15 LAB — COMPLETE METABOLIC PANEL WITH GFR
AG Ratio: 1.7 (calc) (ref 1.0–2.5)
ALT: 11 U/L (ref 6–29)
AST: 14 U/L (ref 10–35)
Albumin: 4.3 g/dL (ref 3.6–5.1)
Alkaline phosphatase (APISO): 51 U/L (ref 37–153)
BUN/Creatinine Ratio: 18 (calc) (ref 6–22)
BUN: 21 mg/dL (ref 7–25)
CO2: 30 mmol/L (ref 20–32)
Calcium: 9.9 mg/dL (ref 8.6–10.4)
Chloride: 98 mmol/L (ref 98–110)
Creat: 1.18 mg/dL — ABNORMAL HIGH (ref 0.50–1.05)
Globulin: 2.6 g/dL (ref 1.9–3.7)
Glucose, Bld: 125 mg/dL — ABNORMAL HIGH (ref 65–99)
Potassium: 3.7 mmol/L (ref 3.5–5.3)
Sodium: 136 mmol/L (ref 135–146)
Total Bilirubin: 0.4 mg/dL (ref 0.2–1.2)
Total Protein: 6.9 g/dL (ref 6.1–8.1)
eGFR: 50 mL/min/{1.73_m2} — ABNORMAL LOW (ref >=60)

## 2023-01-15 LAB — CBC WITH DIFFERENTIAL/PLATELET
Absolute Lymphocytes: 2355 {cells}/uL (ref 850–3900)
Absolute Monocytes: 612 {cells}/uL (ref 200–950)
Basophils Absolute: 34 {cells}/uL (ref 0–200)
Basophils Relative: 0.4 %
Eosinophils Absolute: 51 {cells}/uL (ref 15–500)
Eosinophils Relative: 0.6 %
HCT: 40.9 % (ref 35.0–45.0)
Hemoglobin: 13.2 g/dL (ref 11.7–15.5)
MCH: 28 pg (ref 27.0–33.0)
MCHC: 32.3 g/dL (ref 32.0–36.0)
MCV: 86.7 fL (ref 80.0–100.0)
MPV: 11.2 fL (ref 7.5–12.5)
Monocytes Relative: 7.2 %
Neutro Abs: 5449 {cells}/uL (ref 1500–7800)
Neutrophils Relative %: 64.1 %
Platelets: 252 10*3/uL (ref 140–400)
RBC: 4.72 10*6/uL (ref 3.80–5.10)
RDW: 13.8 % (ref 11.0–15.0)
Total Lymphocyte: 27.7 %
WBC: 8.5 10*3/uL (ref 3.8–10.8)

## 2023-01-15 LAB — MICROALBUMIN / CREATININE URINE RATIO
Creatinine, Urine: 31 mg/dL (ref 20–275)
Microalb Creat Ratio: 39 mg/g{creat} — ABNORMAL HIGH (ref <30)
Microalb, Ur: 1.2 mg/dL

## 2023-01-15 LAB — LIPID PANEL
Cholesterol: 147 mg/dL (ref <200)
HDL: 57 mg/dL (ref >=50)
LDL Cholesterol (Calc): 76 mg/dL
Non-HDL Cholesterol (Calc): 90 mg/dL (ref <130)
Total CHOL/HDL Ratio: 2.6 (calc) (ref <5.0)
Triglycerides: 61 mg/dL (ref <150)

## 2023-01-15 LAB — VITAMIN D 25 HYDROXY (VIT D DEFICIENCY, FRACTURES): Vit D, 25-Hydroxy: 55 ng/mL (ref 30–100)

## 2023-01-20 ENCOUNTER — Other Ambulatory Visit: Payer: Self-pay

## 2023-02-03 ENCOUNTER — Other Ambulatory Visit: Payer: Self-pay | Admitting: Family Medicine

## 2023-02-03 DIAGNOSIS — E1169 Type 2 diabetes mellitus with other specified complication: Secondary | ICD-10-CM

## 2023-02-05 ENCOUNTER — Other Ambulatory Visit: Payer: Self-pay

## 2023-02-05 MED ORDER — GLIPIZIDE ER 10 MG PO TB24
10.0000 mg | ORAL_TABLET | Freq: Every day | ORAL | 0 refills | Status: DC
Start: 2023-02-05 — End: 2023-05-03
  Filled 2023-02-05: qty 90, 90d supply, fill #0

## 2023-02-05 NOTE — Telephone Encounter (Signed)
Last follow up 01/14/23

## 2023-02-10 ENCOUNTER — Other Ambulatory Visit: Payer: Self-pay

## 2023-02-12 ENCOUNTER — Other Ambulatory Visit: Payer: Self-pay

## 2023-02-24 ENCOUNTER — Other Ambulatory Visit: Payer: Self-pay

## 2023-03-17 ENCOUNTER — Other Ambulatory Visit: Payer: Self-pay

## 2023-03-30 ENCOUNTER — Other Ambulatory Visit: Payer: Self-pay

## 2023-04-02 ENCOUNTER — Other Ambulatory Visit: Payer: Self-pay

## 2023-05-03 ENCOUNTER — Other Ambulatory Visit: Payer: Self-pay | Admitting: Family Medicine

## 2023-05-03 DIAGNOSIS — E1169 Type 2 diabetes mellitus with other specified complication: Secondary | ICD-10-CM

## 2023-05-04 ENCOUNTER — Other Ambulatory Visit: Payer: Self-pay

## 2023-05-04 MED ORDER — GLIPIZIDE ER 10 MG PO TB24
10.0000 mg | ORAL_TABLET | Freq: Every day | ORAL | 0 refills | Status: DC
Start: 2023-05-04 — End: 2023-06-11
  Filled 2023-05-04: qty 30, 30d supply, fill #0

## 2023-05-15 ENCOUNTER — Ambulatory Visit: Payer: Self-pay | Admitting: Family Medicine

## 2023-06-05 ENCOUNTER — Other Ambulatory Visit: Payer: Self-pay

## 2023-06-05 ENCOUNTER — Other Ambulatory Visit: Payer: Self-pay | Admitting: Family Medicine

## 2023-06-05 DIAGNOSIS — E785 Hyperlipidemia, unspecified: Secondary | ICD-10-CM

## 2023-06-11 ENCOUNTER — Other Ambulatory Visit: Payer: Self-pay

## 2023-06-11 ENCOUNTER — Other Ambulatory Visit: Payer: Self-pay | Admitting: Family Medicine

## 2023-06-11 DIAGNOSIS — E1169 Type 2 diabetes mellitus with other specified complication: Secondary | ICD-10-CM

## 2023-06-11 MED ORDER — GLIPIZIDE ER 10 MG PO TB24
10.0000 mg | ORAL_TABLET | Freq: Every day | ORAL | 0 refills | Status: DC
Start: 2023-06-11 — End: 2023-06-23
  Filled 2023-06-11: qty 30, 30d supply, fill #0

## 2023-06-15 ENCOUNTER — Other Ambulatory Visit: Payer: Self-pay

## 2023-06-23 ENCOUNTER — Other Ambulatory Visit: Payer: Self-pay

## 2023-06-23 ENCOUNTER — Encounter: Payer: Self-pay | Admitting: Family Medicine

## 2023-06-23 ENCOUNTER — Ambulatory Visit: Admitting: Family Medicine

## 2023-06-23 VITALS — BP 112/68 | HR 94 | Resp 16 | Ht 59.0 in | Wt 162.2 lb

## 2023-06-23 DIAGNOSIS — N1831 Chronic kidney disease, stage 3a: Secondary | ICD-10-CM

## 2023-06-23 DIAGNOSIS — E559 Vitamin D deficiency, unspecified: Secondary | ICD-10-CM | POA: Diagnosis not present

## 2023-06-23 DIAGNOSIS — E1159 Type 2 diabetes mellitus with other circulatory complications: Secondary | ICD-10-CM

## 2023-06-23 DIAGNOSIS — E1169 Type 2 diabetes mellitus with other specified complication: Secondary | ICD-10-CM | POA: Diagnosis not present

## 2023-06-23 DIAGNOSIS — J410 Simple chronic bronchitis: Secondary | ICD-10-CM

## 2023-06-23 DIAGNOSIS — E785 Hyperlipidemia, unspecified: Secondary | ICD-10-CM

## 2023-06-23 DIAGNOSIS — I152 Hypertension secondary to endocrine disorders: Secondary | ICD-10-CM | POA: Diagnosis not present

## 2023-06-23 DIAGNOSIS — I10 Essential (primary) hypertension: Secondary | ICD-10-CM | POA: Diagnosis not present

## 2023-06-23 DIAGNOSIS — Z780 Asymptomatic menopausal state: Secondary | ICD-10-CM

## 2023-06-23 DIAGNOSIS — M8580 Other specified disorders of bone density and structure, unspecified site: Secondary | ICD-10-CM

## 2023-06-23 LAB — POCT GLYCOSYLATED HEMOGLOBIN (HGB A1C): Hemoglobin A1C: 6.5 % — AB (ref 4.0–5.6)

## 2023-06-23 MED ORDER — ROSUVASTATIN CALCIUM 40 MG PO TABS
40.0000 mg | ORAL_TABLET | Freq: Every day | ORAL | 1 refills | Status: DC
  Filled 2023-06-23 – 2023-08-06 (×2): qty 90, 90d supply, fill #0
  Filled 2023-11-01: qty 90, 90d supply, fill #1

## 2023-06-23 MED ORDER — PROPRANOLOL HCL 10 MG PO TABS
10.0000 mg | ORAL_TABLET | Freq: Two times a day (BID) | ORAL | 1 refills | Status: DC
  Filled 2023-06-23 – 2023-09-13 (×2): qty 180, 90d supply, fill #0
  Filled 2023-12-08: qty 180, 90d supply, fill #1

## 2023-06-23 MED ORDER — PIOGLITAZONE HCL 15 MG PO TABS
15.0000 mg | ORAL_TABLET | Freq: Every day | ORAL | 1 refills | Status: DC
Start: 2023-06-23 — End: 2023-10-27
  Filled 2023-06-23 – 2023-07-20 (×2): qty 90, 90d supply, fill #0
  Filled 2023-10-16: qty 90, 90d supply, fill #1

## 2023-06-23 MED ORDER — GLIPIZIDE ER 2.5 MG PO TB24
7.5000 mg | ORAL_TABLET | Freq: Every day | ORAL | 1 refills | Status: DC
Start: 2023-06-23 — End: 2023-10-27
  Filled 2023-06-23: qty 270, 90d supply, fill #0
  Filled 2023-10-16: qty 270, 90d supply, fill #1

## 2023-06-23 MED ORDER — VALSARTAN-HYDROCHLOROTHIAZIDE 160-12.5 MG PO TABS
1.0000 | ORAL_TABLET | Freq: Every day | ORAL | 1 refills | Status: DC
Start: 2023-06-23 — End: 2023-07-21
  Filled 2023-06-23: qty 90, 90d supply, fill #0

## 2023-06-23 NOTE — Progress Notes (Signed)
 Name: Tracey Tucker   MRN: [id number]    DOB: 07-18-1955   Date:06/23/2023       Progress Note  Subjective  Chief Complaint  Chief Complaint  Patient presents with   Medical Management of Chronic Issues   Discussed the use of AI scribe software for clinical note transcription with the patient, who gave verbal consent to proceed.  History of Present Illness Tracey Tucker is a 68 year old female with diabetes, chronic bronchitis, and chronic kidney disease who presents for a regular follow-up.  Her diabetes is well-controlled with a recent A1c of 6.5, down from 6.6. She has lost weight, now weighing 162.2 pounds from a previous 167 pounds. She is on pioglitazone , glipizide  10 mg, rosuvastatin , metformin , and Xigduo  11/998. She also uses Ozempic , which is delivered through an assistance program. No symptoms of hypoglycemia such as shakiness or feeling weird, and she does not feel very hungry or thirsty.  For her chronic kidney disease stage 3A and microalbuminuria , she is taking valsartan  HCTZ 160/12.5 and requires a refill, also on SGL2 agonist  She reports no issues with urination and denies any itching. She takes vitamin D  nightly.  Regarding her chronic bronchitis, she is not experiencing regular coughing and denies wheezing or shortness of breath. She continues to smoke but also jogs. She takes Spiriva  for her breathing, which she receives through an assistance program.  For high cholesterol, she takes rosuvastatin  40 mg and denies any muscle aches. For hypertension, she takes propranolol  twice a day and valsartan  hydrochlorothiazide  160/12.5 mg daily     Patient Active Problem List   Diagnosis Date Noted   Simple chronic bronchitis (HCC) 01/02/2021   Vitamin D  deficiency 01/02/2021   Dyslipidemia 01/02/2021   Tobacco abuse counseling 10/22/2017   Arthritis of left knee 09/18/2015   Hypertension associated with diabetes (HCC) 08/22/2014   Hyperlipidemia associated with type 2  diabetes mellitus (HCC) 08/22/2014    Past Surgical History:  Procedure Laterality Date   CATARACT EXTRACTION Left 05/17/14   [facility] [place]   CATARACT EXTRACTION W/PHACO Right 07/26/2014   Procedure: CATARACT EXTRACTION PHACO AND INTRAOCULAR LENS PLACEMENT (IOC);  Surgeon: Annell Kidney, MD;  Location: [facility];  Service: Ophthalmology;  Laterality: Right;  DIABETIC   CESAREAN SECTION     FOOT SURGERY  2014    Family History  Problem Relation Age of Onset   Diabetes Father    Heart disease Father    Diabetes Sister    Hyperlipidemia Sister    Hypertension Sister    Hypertension Brother    Diabetes Brother    Diabetes Sister    Kidney disease Sister    Heart attack Sister    Diabetes Sister    Diabetes Sister    Diabetes Brother    Stomach cancer Brother    Stroke Brother    Breast cancer Neg Hx     Social History   Tobacco Use   Smoking status: Every Day    Current packs/day: 0.25    Average packs/day: 0.3 packs/day for 47.5 years (11.9 ttl pk-yrs)    Types: Cigarettes    Start date: 12/28/1975   Smokeless tobacco: Current    Types: Snuff  Substance Use Topics   Alcohol use: No    Alcohol/week: 0.0 standard drinks of alcohol     Current Outpatient Medications:    aspirin 81 MG tablet, Take 81 mg by mouth daily. PM, Disp: , Rfl:    Blood Glucose  Monitoring Suppl (FREESTYLE LITE) DEVI, USE AS DIRECTED, Disp: 1 each, Rfl: 0   Cholecalciferol (VITAMIN D ) 2000 units CAPS, Take 1 capsule (2,000 Units total) by mouth daily., Disp: 90 capsule, Rfl: 1   Dapagliflozin  Pro-metFORMIN  ER (XIGDUO  XR) 11-998 MG TB24, Take 1 tablet by mouth daily with breakfast., Disp: 90 tablet, Rfl: 0   FREESTYLE LITE test strip, CHECK SUGARS ONCE A DAY, Disp: 100 strip, Rfl: 2   glipiZIDE  (GLUCOTROL  XL) 10 MG 24 hr tablet, Take 1 tablet (10 mg total) by mouth daily with breakfast., Disp: 30 tablet, Rfl: 0   pioglitazone  (ACTOS ) 15 MG tablet, Take 1 tablet (15 mg total) by  mouth daily., Disp: 90 tablet, Rfl: 1   propranolol  (INDERAL ) 10 MG tablet, Take 1 tablet (10 mg total) by mouth 2 (two) times daily., Disp: 180 tablet, Rfl: 1   rosuvastatin  (CRESTOR ) 40 MG tablet, Take 1 tablet (40 mg total) by mouth daily., Disp: 90 tablet, Rfl: 1   Semaglutide , 2 MG/DOSE, (OZEMPIC , 2 MG/DOSE,) 8 MG/3ML SOPN, Inject 2 mg as directed once a week., Disp: 12 mL, Rfl: 3   tiotropium (SPIRIVA  HANDIHALER) 18 MCG inhalation capsule, Place 1 capsule (18 mcg total) into inhaler and inhale daily., Disp: 30 capsule, Rfl: 5   valsartan -hydrochlorothiazide  (DIOVAN -HCT) 160-12.5 MG tablet, Take 1 tablet by mouth daily., Disp: 90 tablet, Rfl: 1  No Known Allergies  I personally reviewed active problem list, medication list, allergies with the patient/caregiver today.   ROS  Ten systems reviewed and is negative except as mentioned in HPI    Objective Physical Exam Constitutional: Patient appears well-developed and well-nourished. Obese  No distress.  HEENT: head atraumatic, normocephalic, pupils equal and reactive to light, neck supple Cardiovascular: Normal rate, regular rhythm and normal heart sounds.  No murmur heard. No BLE edema. Pulmonary/Chest: Effort normal and breath sounds normal. No respiratory distress. Abdominal: Soft.  There is no tenderness. Psychiatric: Patient has a normal mood and affect. behavior is normal. Judgment and thought content normal.   Vitals:   06/23/23 1449  BP: 106/68  Pulse: 94  Resp: 16  SpO2: 98%  Weight: 162 lb 3.2 oz (73.6 kg)  Height: 4\' 11"  (1.499 m)    Body mass index is 32.76 kg/m.  Recent Results (from the past 2160 hours)  POCT glycosylated hemoglobin (Hb A1C)     Status: Abnormal   Collection Time: 06/23/23  2:54 PM  Result Value Ref Range   Hemoglobin A1C 6.5 (A) 4.0 - 5.6 %   HbA1c POC (<> result, manual entry)     HbA1c, POC (prediabetic range)     HbA1c, POC (controlled diabetic range)      Diabetic Foot Exam:      PHQ2/9:    06/23/2023    2:43 PM 01/14/2023    2:10 PM 09/08/2022    9:10 AM 09/05/2022    9:31 AM 05/07/2022    9:52 AM  Depression screen PHQ 2/9  Decreased Interest 0 0 0 0 0  Down, Depressed, Hopeless 0 0 0 0 0  PHQ - 2 Score 0 0 0 0 0  Altered sleeping  0 0  0  Tired, decreased energy  0 0  0  Change in appetite  0 0  0  Feeling bad or failure about yourself   0 0  0  Trouble concentrating  0 0  0  Moving slowly or fidgety/restless  0 0  0  Suicidal thoughts  0 0  0  PHQ-9 Score  0 0  0  Difficult doing work/chores  Not difficult at all       phq 9 is negative  Fall Risk:    06/23/2023    2:43 PM 01/14/2023    2:09 PM 09/08/2022    9:10 AM 09/05/2022    9:27 AM 05/07/2022    9:52 AM  Fall Risk   Falls in the past year? 0 0 0 0 0  Number falls in past yr: 0 0 0 0   Injury with Fall? 0 0 0 0   Risk for fall due to : No Fall Risks  No Fall Risks No Fall Risks No Fall Risks  Follow up Falls prevention discussed;Education provided;Falls evaluation completed  Falls prevention discussed Education provided;Falls prevention discussed Falls prevention discussed     Assessment & Plan Type 2 diabetes mellitus Diabetes well-controlled with A1c of 6.5. No hypoglycemia symptoms. Adjusting glipizide  to prevent hypoglycemia. - Reduce glipizide  dosage to 7.5 mg daily by taking three 2.5 mg tablets. - Continue pioglitazone , metformin , Xigduo , and Ozempic  as prescribed. - Check blood glucose levels regularly. - Provide refills for pioglitazone  and glipizide . - Follow up in 4 months for diabetes management review.  Hypertension Blood pressure at 106/68, lower than previous, repeat today improved a little continue current dose for now. Weight loss may contribute. Monitor for hypotension. - Check blood pressure regularly at home. - Consider discontinuing HCTZ if blood pressure remains low. - Follow up in 4 weeks for blood pressure check and medication adjustment if needed .  Chronic  kidney disease, stage 3A CKD stage 3A well-managed with renal protective medications. - Continue valsartan  and Xigduo  as prescribed.  Simple chronic bronchitis Minimal symptoms with occasional cough. Continues smoking. Discussed smoking cessation. - Continue Spiriva  as prescribed. - Discuss smoking cessation options at future visits.  Hyperlipidemia Managed with rosuvastatin  40 mg. No side effects reported. - Provide refill for rosuvastatin  40 mg.  Osteopenia post menopausal  Managed with high calcium  diet and vitamin D  supplementation. - Continue high calcium  diet and vitamin D  supplementation.

## 2023-06-30 ENCOUNTER — Other Ambulatory Visit: Payer: Self-pay

## 2023-07-01 ENCOUNTER — Other Ambulatory Visit: Payer: Self-pay

## 2023-07-02 ENCOUNTER — Other Ambulatory Visit: Payer: Self-pay

## 2023-07-02 MED ORDER — SPIRIVA RESPIMAT 2.5 MCG/ACT IN AERS
2.0000 | INHALATION_SPRAY | Freq: Every day | RESPIRATORY_TRACT | 3 refills | Status: AC
  Filled 2023-11-01: qty 12, 30d supply, fill #0

## 2023-07-20 ENCOUNTER — Other Ambulatory Visit: Payer: Self-pay

## 2023-07-21 ENCOUNTER — Encounter: Payer: Self-pay | Admitting: Family Medicine

## 2023-07-21 ENCOUNTER — Ambulatory Visit: Admitting: Family Medicine

## 2023-07-21 ENCOUNTER — Other Ambulatory Visit: Payer: Self-pay

## 2023-07-21 VITALS — BP 102/66 | HR 88 | Resp 16 | Ht 59.0 in | Wt 162.5 lb

## 2023-07-21 DIAGNOSIS — N1831 Chronic kidney disease, stage 3a: Secondary | ICD-10-CM

## 2023-07-21 DIAGNOSIS — E1169 Type 2 diabetes mellitus with other specified complication: Secondary | ICD-10-CM | POA: Diagnosis not present

## 2023-07-21 DIAGNOSIS — E1159 Type 2 diabetes mellitus with other circulatory complications: Secondary | ICD-10-CM | POA: Diagnosis not present

## 2023-07-21 DIAGNOSIS — J410 Simple chronic bronchitis: Secondary | ICD-10-CM

## 2023-07-21 DIAGNOSIS — Z7984 Long term (current) use of oral hypoglycemic drugs: Secondary | ICD-10-CM

## 2023-07-21 DIAGNOSIS — I152 Hypertension secondary to endocrine disorders: Secondary | ICD-10-CM

## 2023-07-21 DIAGNOSIS — E785 Hyperlipidemia, unspecified: Secondary | ICD-10-CM | POA: Diagnosis not present

## 2023-07-21 DIAGNOSIS — Z7985 Long-term (current) use of injectable non-insulin antidiabetic drugs: Secondary | ICD-10-CM

## 2023-07-21 MED ORDER — VALSARTAN-HYDROCHLOROTHIAZIDE 80-12.5 MG PO TABS
1.0000 | ORAL_TABLET | Freq: Every day | ORAL | 0 refills | Status: DC
  Filled 2023-07-21 – 2023-09-29 (×2): qty 90, 90d supply, fill #0

## 2023-07-21 NOTE — Progress Notes (Signed)
 Name: Tracey Tucker   MRN: [id number]    DOB: [birth year]   Date:07/21/2023       Progress Note  Subjective  Chief Complaint  Chief Complaint  Patient presents with   Medical Management of Chronic Issues   Discussed the use of AI scribe software for clinical note transcription with the patient, who gave verbal consent to proceed.  History of Present Illness Tracey Tucker is a 68 year old female with diabetes and hypertension who presents for diabetes management.  Blood sugar levels have been generally well-controlled, with occasional elevations. Blood sugar was high on two occasions, once after consuming orange juice and another time after eating at a bar, with readings of 208 mg/dL and 962 mg/dL, respectively. Most of the time, blood sugar levels range from 69 to 130 mg/dL, typically around 952 to 110 mg/dL. She is currently taking pioglitazone , glipizide , Xigduo , and Ozempic  for her diabetes. Recently, her glipizide  dose was reduced from 10 mg to 7.5 mg.  She has a history of hypertension and chronic kidney disease. Blood pressure readings have been on the lower side, with recent readings at 98/62 mmHg and 102/66 mmHg. Previous readings include 134/68 mmHg in December and 112/68 mmHg in May. No symptoms of dizziness or lightheadedness and she has good energy levels. She walks every day and feels well. Current medications for hypertension include Inderal  (propranolol ) 10 mg twice daily and valsartan  with hydrochlorothiazide  160/12.5 mg   She has chronic kidney disease stage IIIa, with her last blood work in December showing a GFR of 50.    Patient Active Problem List   Diagnosis Date Noted   Simple chronic bronchitis (HCC) 01/02/2021   Vitamin D  deficiency 01/02/2021   Dyslipidemia 01/02/2021   Tobacco abuse counseling 10/22/2017   Arthritis of left knee 09/18/2015   Hypertension associated with diabetes (HCC) 08/22/2014   Hyperlipidemia associated with type 2 diabetes mellitus (HCC)  08/22/2014    Past Surgical History:  Procedure Laterality Date   CATARACT EXTRACTION Left 05/17/14   [facility] [place]   CATARACT EXTRACTION W/PHACO Right 07/26/2014   Procedure: CATARACT EXTRACTION PHACO AND INTRAOCULAR LENS PLACEMENT (IOC);  Surgeon: Annell Kidney, MD;  Location: [facility];  Service: Ophthalmology;  Laterality: Right;  DIABETIC   CESAREAN SECTION     FOOT SURGERY  2014    Family History  Problem Relation Age of Onset   Diabetes Father    Heart disease Father    Diabetes Sister    Hyperlipidemia Sister    Hypertension Sister    Hypertension Brother    Diabetes Brother    Diabetes Sister    Kidney disease Sister    Heart attack Sister    Diabetes Sister    Diabetes Sister    Diabetes Brother    Stomach cancer Brother    Stroke Brother    Breast cancer Neg Hx     Social History   Tobacco Use   Smoking status: Every Day    Current packs/day: 0.25    Average packs/day: 0.3 packs/day for 47.6 years (11.9 ttl pk-yrs)    Types: Cigarettes    Start date: 12/28/1975   Smokeless tobacco: Current    Types: Snuff  Substance Use Topics   Alcohol use: No    Alcohol/week: 0.0 standard drinks of alcohol     Current Outpatient Medications:    aspirin 81 MG tablet, Take 81 mg by mouth daily. PM, Disp: , Rfl:    Blood Glucose Monitoring  Suppl (FREESTYLE LITE) DEVI, USE AS DIRECTED, Disp: 1 each, Rfl: 0   Cholecalciferol (VITAMIN D ) 2000 units CAPS, Take 1 capsule (2,000 Units total) by mouth daily., Disp: 90 capsule, Rfl: 1   Dapagliflozin  Pro-metFORMIN  ER (XIGDUO  XR) 11-998 MG TB24, Take 1 tablet by mouth daily with breakfast., Disp: 90 tablet, Rfl: 0   FREESTYLE LITE test strip, CHECK SUGARS ONCE A DAY, Disp: 100 strip, Rfl: 2   glipiZIDE  (GLUCOTROL  XL) 2.5 MG 24 hr tablet, Take 3 tablets (7.5 mg total) by mouth daily with breakfast., Disp: 270 tablet, Rfl: 1   pioglitazone  (ACTOS ) 15 MG tablet, Take 1 tablet (15 mg total) by mouth daily., Disp:  90 tablet, Rfl: 1   propranolol  (INDERAL ) 10 MG tablet, Take 1 tablet (10 mg total) by mouth 2 (two) times daily., Disp: 180 tablet, Rfl: 1   rosuvastatin  (CRESTOR ) 40 MG tablet, Take 1 tablet (40 mg total) by mouth daily., Disp: 90 tablet, Rfl: 1   Semaglutide , 2 MG/DOSE, (OZEMPIC , 2 MG/DOSE,) 8 MG/3ML SOPN, Inject 2 mg as directed once a week., Disp: 12 mL, Rfl: 3   tiotropium (SPIRIVA  HANDIHALER) 18 MCG inhalation capsule, Place 1 capsule (18 mcg total) into inhaler and inhale daily., Disp: 30 capsule, Rfl: 5   Tiotropium Bromide  Monohydrate (SPIRIVA  RESPIMAT) 2.5 MCG/ACT AERS, Inhale 2 puffs into the lungs daily., Disp: 12 g, Rfl: 3   valsartan -hydrochlorothiazide  (DIOVAN -HCT) 160-12.5 MG tablet, Take 1 tablet by mouth daily., Disp: 90 tablet, Rfl: 1  No Known Allergies  I personally reviewed active problem list, medication list, allergies, family history with the patient/caregiver today.   ROS  Ten systems reviewed and is negative except as mentioned in HPI    Objective Physical Exam VITALS: BP- 98/62 CONSTITUTIONAL: Patient appears well-developed and well-nourished.  No distress. HEENT: Head atraumatic, normocephalic, neck supple. CARDIOVASCULAR: Normal rate, regular rhythm and normal heart sounds.  No murmur heard. No BLE edema. PULMONARY: Effort normal and breath sounds normal. No respiratory distress. ABDOMINAL: There is no tenderness or distention. MUSCULOSKELETAL: Normal gait. Without gross motor or sensory deficit. PSYCHIATRIC: Patient has a normal mood and affect. behavior is normal. Judgment and thought content normal.  Vitals:   07/21/23 1307 07/21/23 1312  BP: 98/62 102/66  Pulse: 88   Resp: 16   SpO2: 99%   Weight: 162 lb 8 oz (73.7 kg)   Height: 4\' 11"  (1.499 m)     Body mass index is 32.82 kg/m.  Recent Results (from the past 2160 hours)  POCT glycosylated hemoglobin (Hb A1C)     Status: Abnormal   Collection Time: 06/23/23  2:54 PM  Result Value Ref  Range   Hemoglobin A1C 6.5 (A) 4.0 - 5.6 %   HbA1c POC (<> result, manual entry)     HbA1c, POC (prediabetic range)     HbA1c, POC (controlled diabetic range)      Diabetic Foot Exam:     PHQ2/9:    07/21/2023    1:07 PM 06/23/2023    2:43 PM 01/14/2023    2:10 PM 09/08/2022    9:10 AM 09/05/2022    9:31 AM  Depression screen PHQ 2/9  Decreased Interest 0 0 0 0 0  Down, Depressed, Hopeless 0 0 0 0 0  PHQ - 2 Score 0 0 0 0 0  Altered sleeping   0 0   Tired, decreased energy   0 0   Change in appetite   0 0   Feeling bad or failure about  yourself    0 0   Trouble concentrating   0 0   Moving slowly or fidgety/restless   0 0   Suicidal thoughts   0 0   PHQ-9 Score   0 0   Difficult doing work/chores   Not difficult at all      phq 9 is negative  Fall Risk:    07/21/2023    1:07 PM 06/23/2023    2:43 PM 01/14/2023    2:09 PM 09/08/2022    9:10 AM 09/05/2022    9:27 AM  Fall Risk   Falls in the past year? 0 0 0 0 0  Number falls in past yr: 0 0 0 0 0  Injury with Fall? 0 0 0 0 0  Risk for fall due to : No Fall Risks No Fall Risks  No Fall Risks No Fall Risks  Follow up Falls prevention discussed;Education provided;Falls evaluation completed Falls prevention discussed;Education provided;Falls evaluation completed  Falls prevention discussed Education provided;Falls prevention discussed      Assessment & Plan Type 2 Diabetes Mellitus with dyslipidemia and HTN  Diabetes managed with pioglitazone , glipizide , Xigduo , and Ozempic . A1c was 6.5% in May. Plan to reduce glipizide  to minimize hypoglycemia risk. - Reduce glipizide  to 5 mg daily. - Continue pioglitazone , Xigduo , and Ozempic  as prescribed. - Schedule follow-up in September to check A1c. - Encourage dietary management to maintain stable blood sugar levels.  Hypertension Blood pressure on lower end of normal. On valsartan , HCTZ, and Inderal . Reduce valsartan  to prevent hypotension due to chronic kidney disease. -  Reduce valsartan  dosage from 160 mg to 80 mg with HCTZ 12.5 mg. - Continue Inderal  (propranolol ) 10 mg twice daily. - Instruct her to obtain a blood pressure cuff for home monitoring. - Schedule a CMA visit in a couple of weeks to check blood pressure.  Chronic Kidney Disease stage IIIa GFR approximately 50. Blood pressure management crucial to prevent further kidney damage. - Monitor kidney function regularly. - Adjust blood pressure medications as needed to maintain optimal kidney health.  General Health Maintenance Advised to maintain regular health screenings and lifestyle modifications. - Request results from recent eye exam at [facility]. - Encourage regular physical activity and healthy diet.

## 2023-08-04 ENCOUNTER — Ambulatory Visit

## 2023-08-06 ENCOUNTER — Other Ambulatory Visit: Payer: Self-pay

## 2023-09-01 ENCOUNTER — Other Ambulatory Visit: Payer: Self-pay | Admitting: Family Medicine

## 2023-09-01 ENCOUNTER — Other Ambulatory Visit: Payer: Self-pay

## 2023-09-01 MED ORDER — OZEMPIC (2 MG/DOSE) 8 MG/3ML ~~LOC~~ SOPN
2.0000 mg | PEN_INJECTOR | SUBCUTANEOUS | 0 refills | Status: DC
  Filled 2023-09-01: qty 12, 112d supply, fill #0
  Filled 2023-09-03 – 2023-09-04 (×2): qty 3, 28d supply, fill #0
  Filled 2023-09-29 – 2023-09-30 (×2): qty 3, 28d supply, fill #1

## 2023-09-01 NOTE — Telephone Encounter (Signed)
?   Last rx written in 2024?

## 2023-09-02 ENCOUNTER — Other Ambulatory Visit: Payer: Self-pay

## 2023-09-02 ENCOUNTER — Other Ambulatory Visit (HOSPITAL_COMMUNITY): Payer: Self-pay

## 2023-09-04 ENCOUNTER — Other Ambulatory Visit: Payer: Self-pay

## 2023-09-10 ENCOUNTER — Ambulatory Visit (INDEPENDENT_AMBULATORY_CARE_PROVIDER_SITE_OTHER): Payer: PPO

## 2023-09-10 DIAGNOSIS — Z0000 Encounter for general adult medical examination without abnormal findings: Secondary | ICD-10-CM

## 2023-09-10 DIAGNOSIS — Z1211 Encounter for screening for malignant neoplasm of colon: Secondary | ICD-10-CM

## 2023-09-10 DIAGNOSIS — Z1231 Encounter for screening mammogram for malignant neoplasm of breast: Secondary | ICD-10-CM

## 2023-09-10 NOTE — Patient Instructions (Addendum)
 [name] , Thank you for taking time out of your busy schedule to complete your Annual Wellness Visit with me. I enjoyed our conversation and look forward to speaking with you again next year. I, as well as your care team,  appreciate your ongoing commitment to your health goals. Please review the following plan we discussed and let me know if I can assist you in the future.  REFERRAL SENT FOR MAMMOGRAM & COLOGUARD  You have an order for:  []   2D Mammogram  [x]   3D Mammogram  []   Bone Density     Please call for appointment:  [facility]  [address] [place] [address] [phone] [facility] [address] [place], [place] [address] [phone] [facility] [address] [place] Tracey [place], [place] [address] [phone]   Make sure to wear two-piece clothing.  No lotions, powders, or deodorants the day of the appointment. Make sure to bring picture ID and insurance card.  Bring list of medications you are currently taking including any supplements.   Schedule your [place] screening mammogram through MyChart!   Log into your MyChart account.  Go to 'Visit' (or 'Appointments' if on mobile App) --> Schedule an Appointment  Under 'Select a Reason for Visit' choose the Mammogram Screening option.  Complete the pre-visit questions and select the time and place that best fits your schedule.   Follow up Visits:         09/15/24 @ 9:30 AM BY PHONE We will see or speak with you next year for your Next Medicare AWV with our clinical staff Have you seen your provider in the last 6 months (3 months if uncontrolled diabetes)? Yes  Clinician Recommendations:  Aim for 30 minutes of exercise or brisk walking, 6-8 glasses of water, and 5 servings of fruits and vegetables each day. TAKE CARE!      This is a list of the screenings recommended for you:  Health  Maintenance  Topic Date Due   COVID-19 Vaccine (3 - Pfizer risk series) 10/21/2019   Eye exam for diabetics  02/12/2023   Mammogram  08/13/2023   Flu Shot  09/11/2023   Cologuard (Stool DNA test)  10/04/2023   Hemoglobin A1C  12/24/2023   Yearly kidney function blood test for diabetes  01/14/2024   Yearly kidney health urinalysis for diabetes  01/14/2024   Complete foot exam   01/14/2024   DEXA scan (bone density measurement)  07/24/2024   Medicare Annual Wellness Visit  09/09/2024   DTaP/Tdap/Td vaccine (2 - Td or Tdap) 11/06/2029   Pneumococcal Vaccine for age over 74  Completed   Hepatitis C Screening  Completed   Zoster (Shingles) Vaccine  Completed   Hepatitis B Vaccine  Aged Out   HPV Vaccine  Aged Out   Meningitis B Vaccine  Aged Out    Advanced directives: (ACP Link)Information on Advanced Care Planning can be found at [facility] Advance Health Care Directives Advance Health Care Directives. [email or web address]  Advance Care Planning is important because it:  [x]  Makes sure you receive the medical care that is consistent with your values, goals, and preferences  [x]  It provides guidance to your family and loved ones and reduces their decisional burden about whether or not they are making the right decisions based on your wishes.  Follow the link provided in your after visit  summary or read over the paperwork we have mailed to you to help you started getting your Advance Directives in place. If you need assistance in completing these, please reach out to us  so that we can help you!

## 2023-09-10 NOTE — Progress Notes (Signed)
 Subjective:   Tracey Tucker is a 68 y.o. who presents for a Medicare Wellness preventive visit.  As a reminder, Annual Wellness Visits don't include a physical exam, and some assessments may be limited, especially if this visit is performed virtually. We may recommend an in-person follow-up visit with your provider if needed.  Visit Complete: Virtual I connected with  Tracey Tucker on 09/10/23 by a audio enabled telemedicine application and verified that I am speaking with the correct person using two identifiers.  Patient Location: Home  Provider Location: Home Office  I discussed the limitations of evaluation and management by telemedicine. The patient expressed understanding and agreed to proceed.  Vital Signs: Because this visit was a virtual/telehealth visit, some criteria may be missing or patient reported. Any vitals not documented were not able to be obtained and vitals that have been documented are patient reported.  VideoDeclined- This patient declined Librarian, academic. Therefore the visit was completed with audio only.  Persons Participating in Visit: Patient.  AWV Questionnaire: No: Patient Medicare AWV questionnaire was not completed prior to this visit.  Cardiac Risk Factors include: advanced age (>58men, >18 women);dyslipidemia;diabetes mellitus;hypertension;obesity (BMI >30kg/m2);smoking/ tobacco exposure     Objective:    There were no vitals filed for this visit. There is no height or weight on file to calculate BMI.     09/10/2023    9:41 AM 09/05/2022    9:34 AM 12/12/2016   10:04 AM 09/11/2016    9:37 AM 06/11/2016   11:22 AM 03/11/2016   11:13 AM 12/19/2015    8:47 AM  Advanced Directives  Does Patient Have a Medical Advance Directive? No No No  No  No  No  No   Would patient like information on creating a medical advance directive? No - Patient declined      No - patient declined information      Data saved with a previous  flowsheet row definition    Current Medications (verified) Outpatient Encounter Medications as of 09/10/2023  Medication Sig   aspirin 81 MG tablet Take 81 mg by mouth daily. PM   Blood Glucose Monitoring Suppl (FREESTYLE LITE) DEVI USE AS DIRECTED   Cholecalciferol (VITAMIN D ) 2000 units CAPS Take 1 capsule (2,000 Units total) by mouth daily.   Dapagliflozin  Pro-metFORMIN  ER (XIGDUO  XR) 11-998 MG TB24 Take 1 tablet by mouth daily with breakfast.   FREESTYLE LITE test strip CHECK SUGARS ONCE A DAY   glipiZIDE  (GLUCOTROL  XL) 2.5 MG 24 hr tablet Take 3 tablets (7.5 mg total) by mouth daily with breakfast.   pioglitazone  (ACTOS ) 15 MG tablet Take 1 tablet (15 mg total) by mouth daily.   propranolol  (INDERAL ) 10 MG tablet Take 1 tablet (10 mg total) by mouth 2 (two) times daily.   rosuvastatin  (CRESTOR ) 40 MG tablet Take 1 tablet (40 mg total) by mouth daily.   Semaglutide , 2 MG/DOSE, (OZEMPIC , 2 MG/DOSE,) 8 MG/3ML SOPN Inject 2 mg as directed once a week.   tiotropium (SPIRIVA  HANDIHALER) 18 MCG inhalation capsule Place 1 capsule (18 mcg total) into inhaler and inhale daily.   Tiotropium Bromide  Monohydrate (SPIRIVA  RESPIMAT) 2.5 MCG/ACT AERS Inhale 2 puffs into the lungs daily.   valsartan -hydrochlorothiazide  (DIOVAN -HCT) 80-12.5 MG tablet Take 1 tablet by mouth daily.   No facility-administered encounter medications on file as of 09/10/2023.    Allergies (verified) Patient has no known allergies.   History: Past Medical History:  Diagnosis Date   Diabetes mellitus  without complication (HCC)    Hypercholesteremia    Hypertension    Osteoporosis    Seasonal allergies    Wears dentures    full upper, partial lower   Past Surgical History:  Procedure Laterality Date   CATARACT EXTRACTION Left 05/17/14   [facility] [place]   CATARACT EXTRACTION W/PHACO Right 07/26/2014   Procedure: CATARACT EXTRACTION PHACO AND INTRAOCULAR LENS PLACEMENT (IOC);  Surgeon: Dene Etienne, MD;   Location: [facility];  Service: Ophthalmology;  Laterality: Right;  DIABETIC   CESAREAN SECTION     FOOT SURGERY  2014   Family History  Problem Relation Age of Onset   Diabetes Father    Heart disease Father    Diabetes Sister    Hyperlipidemia Sister    Hypertension Sister    Hypertension Brother    Diabetes Brother    Diabetes Sister    Kidney disease Sister    Heart attack Sister    Diabetes Sister    Diabetes Sister    Diabetes Brother    Stomach cancer Brother    Stroke Brother    Breast cancer Neg Hx    Social History   Socioeconomic History   Marital status: Single    Spouse name: Not on file   Number of children: Not on file   Years of education: Not on file   Highest education level: Not on file  Occupational History   Not on file  Tobacco Use   Smoking status: Every Day    Current packs/day: 0.25    Average packs/day: 0.3 packs/day for 47.7 years (11.9 ttl pk-yrs)    Types: Cigarettes    Start date: 12/28/1975   Smokeless tobacco: Current    Types: Snuff  Vaping Use   Vaping status: Never Used  Substance and Sexual Activity   Alcohol use: No    Alcohol/week: 0.0 standard drinks of alcohol   Drug use: No   Sexual activity: Not Currently  Other Topics Concern   Not on file  Social History Narrative   Retired!!!   She lives with sister , one nephew and one niece    Social Drivers of Corporate investment banker Strain: Low Risk  (09/10/2023)   Overall Financial Resource Strain (CARDIA)    Difficulty of Paying Living Expenses: Not hard at all  Food Insecurity: No Food Insecurity (09/10/2023)   Hunger Vital Sign    Worried About Running Out of Food in the Last Year: Never true    Ran Out of Food in the Last Year: Never true  Transportation Needs: No Transportation Needs (09/10/2023)   PRAPARE - Administrator, Civil Service (Medical): No    Lack of Transportation (Non-Medical): No  Physical Activity: Sufficiently Active  (09/10/2023)   Exercise Vital Sign    Days of Exercise per Week: 7 days    Minutes of Exercise per Session: 60 min  Stress: No Stress Concern Present (09/10/2023)   [employer] of Occupational Health - Occupational Stress Questionnaire    Feeling of Stress: Not at all  Social Connections: Moderately Isolated (09/10/2023)   Social Connection and Isolation Panel    Frequency of Communication with Friends and Family: More than three times a week    Frequency of Social Gatherings with Friends and Family: More than three times a week    Attends Religious Services: More than 4 times per year    Active Member of Clubs or Organizations: No    Attends  Club or Organization Meetings: Never    Marital Status: Never married    Tobacco Counseling Ready to quit: Not Answered Counseling given: Not Answered    Clinical Intake:  Pre-visit preparation completed: Yes  Pain : No/denies pain     BMI - recorded: 32.7 Nutritional Status: BMI > 30  Obese Nutritional Risks: None Diabetes: Yes CBG done?: No Did pt. bring in CBG monitor from home?: No  Lab Results  Component Value Date   HGBA1C 6.5 (A) 06/23/2023   HGBA1C 6.6 (A) 01/14/2023   HGBA1C 7.0 (A) 09/08/2022     How often do you need to have someone help you when you read instructions, pamphlets, or other written materials from your doctor or pharmacy?: 1 - Never  Interpreter Needed?: No  Information entered by :: JHONNIE DAS, LPN   Activities of Daily Living    09/10/2023    9:43 AM 01/14/2023    2:10 PM  In your present state of health, do you have any difficulty performing the following activities:  Hearing? 0 0  Vision? 0 0  Difficulty concentrating or making decisions? 0 0  Walking or climbing stairs? 0 0  Dressing or bathing? 0 0  Doing errands, shopping? 0 0  Preparing Food and eating ? N   Using the Toilet? N   In the past six months, have you accidently leaked urine? N   Do you have problems with loss of  bowel control? N   Managing your Medications? N   Managing your Finances? N   Housekeeping or managing your Housekeeping? N     Patient Care Team: Sowles, Krichna, MD as PCP - General (Family Medicine)  I have updated your Care Teams any recent Medical Services you may have received from other providers in the past year.     Assessment:   This is a routine wellness examination for Tracey Tucker.  Hearing/Vision screen Hearing Screening - Comments:: NO AIDS Vision Screening - Comments:: WEARS GLASSES ALL DAY- [facility] [employer]   Goals Addressed             This Visit's Progress    DIET - EAT MORE FRUITS AND VEGETABLES         Depression Screen     09/10/2023    9:40 AM 07/21/2023    1:07 PM 06/23/2023    2:43 PM 01/14/2023    2:10 PM 09/08/2022    9:10 AM 09/05/2022    9:31 AM 05/07/2022    9:52 AM  PHQ 2/9 Scores  PHQ - 2 Score 0 0 0 0 0 0 0  PHQ- 9 Score 0   0 0  0    Fall Risk     09/10/2023    9:43 AM 07/21/2023    1:07 PM 06/23/2023    2:43 PM 01/14/2023    2:09 PM 09/08/2022    9:10 AM  Fall Risk   Falls in the past year? 0 0 0 0 0  Number falls in past yr: 0 0 0 0 0  Injury with Fall? 0 0 0 0 0  Risk for fall due to : No Fall Risks No Fall Risks No Fall Risks  No Fall Risks  Follow up Falls evaluation completed;Falls prevention discussed Falls prevention discussed;Education provided;Falls evaluation completed Falls prevention discussed;Education provided;Falls evaluation completed  Falls prevention discussed    MEDICARE RISK AT HOME:  Medicare Risk at Home Any stairs in or around the home?: Yes If so, are there any without  handrails?: No Home free of loose throw rugs in walkways, pet beds, electrical cords, etc?: Yes Adequate lighting in your home to reduce risk of falls?: Yes Life alert?: No Use of a cane, walker or w/c?: No Grab bars in the bathroom?: Yes Shower chair or bench in shower?: No Elevated toilet seat or a handicapped toilet?: No  TIMED UP AND  GO:  Was the test performed?  No  Cognitive Function: 6CIT completed        09/05/2022    9:35 AM  6CIT Screen  What Year? 0 points  What month? 0 points  What time? 0 points  Count back from 20 0 points  Months in reverse 4 points  Repeat phrase 4 points  Total Score 8 points    Immunizations Immunization History  Administered Date(s) Administered   Fluad Trivalent(High Dose 65+) 12/09/2022   Influenza, High Dose Seasonal PF 10/28/2021   Influenza,inj,Quad PF,6+ Mos 11/07/2018   Influenza-Unspecified 10/25/2014, 11/13/2015, 10/21/2017, 11/27/2019, 11/15/2020   PFIZER(Purple Top)SARS-COV-2 Vaccination 09/02/2019, 09/23/2019   PNEUMOCOCCAL CONJUGATE-20 05/01/2021   Pneumococcal Conjugate-13 11/07/2019   Pneumococcal Polysaccharide-23 12/25/2014   Tdap 11/07/2019   Zoster Recombinant(Shingrix) 06/29/2020, 08/29/2020, 10/01/2020    Screening Tests Health Maintenance  Topic Date Due   COVID-19 Vaccine (3 - Pfizer risk series) 10/21/2019   OPHTHALMOLOGY EXAM  02/12/2023   MAMMOGRAM  08/13/2023   INFLUENZA VACCINE  09/11/2023   Fecal DNA (Cologuard)  10/04/2023   HEMOGLOBIN A1C  12/24/2023   Diabetic kidney evaluation - eGFR measurement  01/14/2024   Diabetic kidney evaluation - Urine ACR  01/14/2024   FOOT EXAM  01/14/2024   Medicare Annual Wellness (AWV)  09/09/2024   DTaP/Tdap/Td (2 - Td or Tdap) 11/06/2029   Pneumococcal Vaccine: 50+ Years  Completed   DEXA SCAN  Completed   Hepatitis C Screening  Completed   Zoster Vaccines- Shingrix  Completed   Hepatitis B Vaccines  Aged Out   HPV VACCINES  Aged Out   Meningococcal B Vaccine  Aged Out    Health Maintenance  Health Maintenance Due  Topic Date Due   COVID-19 Vaccine (3 - Pfizer risk series) 10/21/2019   OPHTHALMOLOGY EXAM  02/12/2023   MAMMOGRAM  08/13/2023   Health Maintenance Items Addressed: Mammogram ordered; COLOGUARD ORDERED; UP TO DATE ON SHOTS EXCEPT COVID  Additional Screening:  Vision  Screening: Recommended annual ophthalmology exams for early detection of glaucoma and other disorders of the eye. Would you like a referral to an eye doctor? No    Dental Screening: Recommended annual dental exams for proper oral hygiene  Community Resource Referral / Chronic Care Management: CRR required this visit?  No   CCM required this visit?  No   Plan:    I have personally reviewed and noted the following in the patient's chart:   Medical and social history Use of alcohol, tobacco or illicit drugs  Current medications and supplements including opioid prescriptions. Patient is not currently taking opioid prescriptions. Functional ability and status Nutritional status Physical activity Advanced directives List of other physicians Hospitalizations, surgeries, and ER visits in previous 12 months Vitals Screenings to include cognitive, depression, and falls Referrals and appointments  In addition, I have reviewed and discussed with patient certain preventive protocols, quality metrics, and best practice recommendations. A written personalized care plan for preventive services as well as general preventive health recommendations were provided to patient.   Jhonnie GORMAN Das, LPN   [id number]   After Visit Summary: (MyChart) Due  to this being a telephonic visit, the after visit summary with patients personalized plan was offered to patient via MyChart   Notes: MAMMOGRAM & COLOGUARD ORDERED

## 2023-09-13 ENCOUNTER — Other Ambulatory Visit: Payer: Self-pay

## 2023-09-17 ENCOUNTER — Other Ambulatory Visit: Payer: Self-pay

## 2023-09-21 ENCOUNTER — Other Ambulatory Visit: Payer: Self-pay

## 2023-09-29 ENCOUNTER — Other Ambulatory Visit: Payer: Self-pay

## 2023-09-30 ENCOUNTER — Other Ambulatory Visit: Payer: Self-pay

## 2023-10-08 ENCOUNTER — Other Ambulatory Visit: Payer: Self-pay

## 2023-10-08 MED ORDER — OZEMPIC (2 MG/DOSE) 8 MG/3ML ~~LOC~~ SOPN
2.0000 mg | PEN_INJECTOR | SUBCUTANEOUS | 3 refills | Status: AC
  Filled 2023-10-16: qty 12, 112d supply, fill #0
  Filled 2024-02-10: qty 12, 112d supply, fill #1

## 2023-10-09 ENCOUNTER — Other Ambulatory Visit: Payer: Self-pay

## 2023-10-13 ENCOUNTER — Other Ambulatory Visit: Payer: Self-pay

## 2023-10-14 DIAGNOSIS — Z1211 Encounter for screening for malignant neoplasm of colon: Secondary | ICD-10-CM | POA: Diagnosis not present

## 2023-10-16 ENCOUNTER — Other Ambulatory Visit: Payer: Self-pay

## 2023-10-17 ENCOUNTER — Other Ambulatory Visit: Payer: Self-pay

## 2023-10-19 ENCOUNTER — Other Ambulatory Visit: Payer: Self-pay

## 2023-10-20 ENCOUNTER — Other Ambulatory Visit: Payer: Self-pay

## 2023-10-21 ENCOUNTER — Ambulatory Visit: Payer: Self-pay | Admitting: Family Medicine

## 2023-10-21 LAB — COLOGUARD: COLOGUARD: NEGATIVE

## 2023-10-27 ENCOUNTER — Other Ambulatory Visit: Payer: Self-pay

## 2023-10-27 ENCOUNTER — Ambulatory Visit: Admitting: Family Medicine

## 2023-10-27 ENCOUNTER — Encounter: Payer: Self-pay | Admitting: Family Medicine

## 2023-10-27 VITALS — BP 124/82 | HR 84 | Temp 98.1°F | Resp 16 | Ht 59.0 in | Wt 161.9 lb

## 2023-10-27 DIAGNOSIS — Z780 Asymptomatic menopausal state: Secondary | ICD-10-CM | POA: Diagnosis not present

## 2023-10-27 DIAGNOSIS — Z1231 Encounter for screening mammogram for malignant neoplasm of breast: Secondary | ICD-10-CM

## 2023-10-27 DIAGNOSIS — M8580 Other specified disorders of bone density and structure, unspecified site: Secondary | ICD-10-CM | POA: Diagnosis not present

## 2023-10-27 DIAGNOSIS — E1169 Type 2 diabetes mellitus with other specified complication: Secondary | ICD-10-CM

## 2023-10-27 DIAGNOSIS — E1159 Type 2 diabetes mellitus with other circulatory complications: Secondary | ICD-10-CM

## 2023-10-27 DIAGNOSIS — I152 Hypertension secondary to endocrine disorders: Secondary | ICD-10-CM | POA: Diagnosis not present

## 2023-10-27 DIAGNOSIS — E785 Hyperlipidemia, unspecified: Secondary | ICD-10-CM

## 2023-10-27 DIAGNOSIS — J410 Simple chronic bronchitis: Secondary | ICD-10-CM | POA: Diagnosis not present

## 2023-10-27 DIAGNOSIS — E66811 Obesity, class 1: Secondary | ICD-10-CM

## 2023-10-27 DIAGNOSIS — M85851 Other specified disorders of bone density and structure, right thigh: Secondary | ICD-10-CM

## 2023-10-27 DIAGNOSIS — N1831 Chronic kidney disease, stage 3a: Secondary | ICD-10-CM

## 2023-10-27 LAB — POCT GLYCOSYLATED HEMOGLOBIN (HGB A1C): Hemoglobin A1C: 6.9 % — AB (ref 4.0–5.6)

## 2023-10-27 MED ORDER — PIOGLITAZONE HCL 15 MG PO TABS
15.0000 mg | ORAL_TABLET | Freq: Every day | ORAL | 1 refills | Status: DC
  Filled 2023-10-27 – 2023-12-26 (×2): qty 90, 90d supply, fill #0

## 2023-10-27 MED ORDER — GLIPIZIDE ER 2.5 MG PO TB24
7.5000 mg | ORAL_TABLET | Freq: Every day | ORAL | 1 refills | Status: DC
  Filled 2023-10-27 – 2024-01-16 (×2): qty 270, 90d supply, fill #0

## 2023-10-27 MED ORDER — VALSARTAN-HYDROCHLOROTHIAZIDE 80-12.5 MG PO TABS
1.0000 | ORAL_TABLET | Freq: Every day | ORAL | 1 refills | Status: DC
  Filled 2023-10-27 – 2023-12-26 (×2): qty 90, 90d supply, fill #0

## 2023-10-27 NOTE — Progress Notes (Signed)
 Name: Tracey Tucker   MRN: [id number]    DOB: 1956/01/19   Date:10/27/2023       Progress Note  Subjective  Chief Complaint  Chief Complaint  Patient presents with   Medical Management of Chronic Issues   Discussed the use of AI scribe software for clinical note transcription with the patient, who gave verbal consent to proceed.  History of Present Illness Tracey Tucker is a 68 year old female with diabetes who presents for a four-month follow-up.  Her HbA1c has increased from 6.5% to 6.9% with no changes in her medication regimen. She is taking Ozempic  2 mg once a week, glipizide  2.5 mg, pioglitazone  15 mg, and Xigduo  11/998 mg. She has not been checking her blood sugar at home and denies significant dietary changes, although she eats late at night due to her work schedule. No increased hunger, thirst, or urination.  She manages high blood pressure with propranolol  10 mg twice a day and valsartan  HCTZ 80/12.5 mg. No chest pain, palpitations, or dizziness upon standing.  For cholesterol management, she takes rosuvastatin . Her last lipid panel showed LDL at 76.  She has chronic kidney disease and takes SGL-2 agonist and ARB. Her GFR has been between 46 and 50.  She has chronic bronchitis and continues to smoke, experiencing daily coughing but no shortness of breath or wheezing. She uses Spiriva  Respimat with benefit.  Her weight has decreased to 161 pounds, and her BMI is now below 35. She continues to walk every morning and feels she has lost weight.  She has a history of osteopenia, particularly in the right femur.    Patient Active Problem List   Diagnosis Date Noted   Simple chronic bronchitis (HCC) 01/02/2021   Vitamin D  deficiency 01/02/2021   Dyslipidemia 01/02/2021   Tobacco abuse counseling 10/22/2017   Arthritis of left knee 09/18/2015   Hypertension associated with diabetes (HCC) 08/22/2014   Hyperlipidemia associated with type 2 diabetes mellitus (HCC) 08/22/2014     Past Surgical History:  Procedure Laterality Date   CATARACT EXTRACTION Left 05/17/14   [facility] [place]   CATARACT EXTRACTION W/PHACO Right 07/26/2014   Procedure: CATARACT EXTRACTION PHACO AND INTRAOCULAR LENS PLACEMENT (IOC);  Surgeon: Dene Etienne, MD;  Location: [facility];  Service: Ophthalmology;  Laterality: Right;  DIABETIC   CESAREAN SECTION     FOOT SURGERY  2014    Family History  Problem Relation Age of Onset   Diabetes Father    Heart disease Father    Diabetes Sister    Hyperlipidemia Sister    Hypertension Sister    Hypertension Brother    Diabetes Brother    Diabetes Sister    Kidney disease Sister    Heart attack Sister    Diabetes Sister    Diabetes Sister    Diabetes Brother    Stomach cancer Brother    Stroke Brother    Breast cancer Neg Hx     Social History   Tobacco Use   Smoking status: Every Day    Current packs/day: 0.25    Average packs/day: 0.3 packs/day for 47.8 years (12.0 ttl pk-yrs)    Types: Cigarettes    Start date: 12/28/1975   Smokeless tobacco: Current    Types: Snuff  Substance Use Topics   Alcohol use: No    Alcohol/week: 0.0 standard drinks of alcohol     Current Outpatient Medications:    aspirin 81 MG tablet, Take 81 mg by mouth daily.  PM, Disp: , Rfl:    Blood Glucose Monitoring Suppl (FREESTYLE LITE) DEVI, USE AS DIRECTED, Disp: 1 each, Rfl: 0   Cholecalciferol (VITAMIN D ) 2000 units CAPS, Take 1 capsule (2,000 Units total) by mouth daily., Disp: 90 capsule, Rfl: 1   Dapagliflozin  Pro-metFORMIN  ER (XIGDUO  XR) 11-998 MG TB24, Take 1 tablet by mouth daily with breakfast., Disp: 90 tablet, Rfl: 0   FREESTYLE LITE test strip, CHECK SUGARS ONCE A DAY, Disp: 100 strip, Rfl: 2   glipiZIDE  (GLUCOTROL  XL) 2.5 MG 24 hr tablet, Take 3 tablets (7.5 mg total) by mouth daily with breakfast., Disp: 270 tablet, Rfl: 1   pioglitazone  (ACTOS ) 15 MG tablet, Take 1 tablet (15 mg total) by mouth daily., Disp: 90 tablet,  Rfl: 1   propranolol  (INDERAL ) 10 MG tablet, Take 1 tablet (10 mg total) by mouth 2 (two) times daily., Disp: 180 tablet, Rfl: 1   rosuvastatin  (CRESTOR ) 40 MG tablet, Take 1 tablet (40 mg total) by mouth daily., Disp: 90 tablet, Rfl: 1   Semaglutide , 2 MG/DOSE, (OZEMPIC , 2 MG/DOSE,) 8 MG/3ML SOPN, Inject 2 mg as directed once a week., Disp: 12 mL, Rfl: 0   Semaglutide , 2 MG/DOSE, (OZEMPIC , 2 MG/DOSE,) 8 MG/3ML SOPN, Inject 2 mg into the skin once a week., Disp: 12 mL, Rfl: 3   tiotropium (SPIRIVA  HANDIHALER) 18 MCG inhalation capsule, Place 1 capsule (18 mcg total) into inhaler and inhale daily., Disp: 30 capsule, Rfl: 5   Tiotropium Bromide  Monohydrate (SPIRIVA  RESPIMAT) 2.5 MCG/ACT AERS, Inhale 2 puffs into the lungs daily., Disp: 12 g, Rfl: 3   valsartan -hydrochlorothiazide  (DIOVAN -HCT) 80-12.5 MG tablet, Take 1 tablet by mouth daily., Disp: 90 tablet, Rfl: 0  No Known Allergies  I personally reviewed active problem list, medication list, allergies, family history with the patient/caregiver today.   ROS  Ten systems reviewed and is negative except as mentioned in HPI    Objective Physical Exam  CONSTITUTIONAL: Patient appears well-developed and well-nourished. No distress. HEENT: Head atraumatic, normocephalic, neck supple. CARDIOVASCULAR: Normal rate, regular rhythm and normal heart sounds. No murmur heard. No BLE edema. Feet with slight dryness on the bottom, otherwise normal. PULMONARY: Effort normal and breath sounds normal. No respiratory distress. MUSCULOSKELETAL: Normal gait. Without gross motor or sensory deficit. PSYCHIATRIC: Patient has a normal mood and affect. Behavior is normal. Judgment and thought content normal. NEUROLOGICAL: Sensation symmetric and intact.  Vitals:   10/27/23 1045  BP: 124/82  Pulse: 84  Resp: 16  Temp: 98.1 F (36.7 C)  TempSrc: Oral  SpO2: 98%  Weight: 161 lb 14.4 oz (73.4 kg)  Height: 4' 11 (1.499 m)    Body mass index is 32.7  kg/m.  Recent Results (from the past 2160 hours)  Cologuard     Status: None   Collection Time: 10/14/23 11:30 AM  Result Value Ref Range   COLOGUARD Negative Negative    Comment: The Cologuard (TM) test was performed on this specimen.  NEGATIVE TEST RESULT. A negative Cologuard result indicates a low likelihood that a colorectal cancer (CRC) or advanced adenoma (adenomatous polyps with more advanced pre-malignant features) is present. The chance that a person with a negative Cologuard test has a colorectal cancer is less than 1 in 1500 (negative predictive value >99.9%) or has an advanced adenoma is less than 5.3% (negative predictive value 94.7%). These data are based on a prospective cross-sectional study of 10,000 individuals at average risk for colorectal cancer who were screened with both Cologuard and colonoscopy. (Imperiale  T. et al, N Engl J Med 2014;370(14):1286-1297) The normal value (reference range) for this assay is negative.  COLOGUARD RE-SCREENING RECOMMENDATION: Periodic colorectal cancer screening is an important part of preventive healthcare for asymptomatic individuals at average risk for colorectal cancer. Following a negative Cologuard  result, the American Cancer Society and U.S. Multi-Society Task Force screening guidelines recommend a Cologuard re-screening interval of 3 years.  References: American Cancer Society Guideline for Colorectal Cancer Screening: https://www.cancer.org/cancer/colon-rectal-cancer/detection-diagnosis-staging/acs-recommendations.html.; Rex DK, Boland CR, Dominitz JK, Colorectal Cancer Screening: Recommendations for Physicians and Patients from the U.S. Multi-Society Task Force on Colorectal Cancer Screening , Am J Gastroenterology 2017; 112:1016-1030.  TEST DESCRIPTION: Composite algorithmic analysis of stool DNA-biomarkers with hemoglobin immunoassay.   Quantitative values of individual biomarkers are not reportable and are not associated with  individual biomarker result reference ranges. Cologuard is intended for colorectal cancer screening of adults of either sex, 45 years or older, who are at average-risk for colorectal cancer (CRC). Cologuard has been approved for use by the U.S.  FDA. The performance of Cologuard was established in a cross sectional study of average-risk adults aged 41-84. Cologuard performance in patients ages 53 to 64 years was estimated by sub-group analysis of near-age groups. Colonoscopies performed for a positive result may find as the most clinically significant lesion: colorectal cancer [4.0%], advanced adenoma (including sessile serrated polyps greater than or equal to 1cm diameter) [20%] or non- advanced adenoma [31%]; or no colorectal neoplasia [45%]. These estimates are derived from a prospective cross-sectional screening study of 10,000 individuals at average risk for colorectal cancer who were screened with both Cologuard and colonoscopy. (Imperiale T. et al, LOISE [place] J Med 2014;370(14):1286-1297.) Cologuard may produce a false negative or false positive result (no colorectal cancer or precancerous polyp present at colonoscopy follow up). A negative Cologuard test result does not guarantee the absence of CRC or advanced adenoma  (pre-cancer). The current Cologuard screening interval is every 3 years. Science writer and [place] Therapist, music). Cologuard performance data in a 10,000 patient pivotal study using colonoscopy as the reference method can be accessed at the following location: www.exactlabs.com/results. Additional description of the Cologuard test process, warnings and precautions can be found at www.cologuard.com.     Diabetic Foot Exam:  Diabetic foot exam was performed with the following findings:   No deformities, ulcerations, or other skin breakdown Normal sensation of 10g monofilament Intact posterior tibialis and dorsalis pedis pulses      PHQ2/9:    10/27/2023   10:41 AM  09/10/2023    9:40 AM 07/21/2023    1:07 PM 06/23/2023    2:43 PM 01/14/2023    2:10 PM  Depression screen PHQ 2/9  Decreased Interest 0 0 0 0 0  Down, Depressed, Hopeless 0 0 0 0 0  PHQ - 2 Score 0 0 0 0 0  Altered sleeping 0 0   0  Tired, decreased energy 0 0   0  Change in appetite 0 0   0  Feeling bad or failure about yourself  0 0   0  Trouble concentrating 0 0   0  Moving slowly or fidgety/restless 0 0   0  Suicidal thoughts 0 0   0  PHQ-9 Score 0 0   0  Difficult doing work/chores Not difficult at all Not difficult at all   Not difficult at all    phq 9 is negative  Fall Risk:    10/27/2023   10:41 AM 09/10/2023  9:43 AM 07/21/2023    1:07 PM 06/23/2023    2:43 PM 01/14/2023    2:09 PM  Fall Risk   Falls in the past year? 0 0 0 0 0  Number falls in past yr: 0 0 0 0 0  Injury with Fall? 0 0 0 0 0  Risk for fall due to : No Fall Risks No Fall Risks No Fall Risks No Fall Risks   Follow up Falls evaluation completed Falls evaluation completed;Falls prevention discussed Falls prevention discussed;Education provided;Falls evaluation completed Falls prevention discussed;Education provided;Falls evaluation completed      Assessment & Plan Type 2 diabetes mellitus Type 2 diabetes mellitus with recent HbA1c increase to 6.9%, still controlled. Possible dietary factors involved. - Continue Ozempic  2 mg weekly, glipizide  2.5 mg daily, pioglitazone  15 mg daily, Xigduo  11/998 mg daily. - Advise against late-night eating and reduce carbohydrates, bread, pasta, rice, desserts, sweet beverages. - Provide medication refills.  Chronic kidney disease, stage 3a Chronic kidney disease stage 3a with stable GFR between 46 to 50. On kidney protective medications. - Order blood work for GFR, lipid panel, anemia, parathyroid  hormone levels.  Chronic bronchitis Chronic bronchitis with daily cough. Continues smoking. No shortness of breath or wheezing. - Continue Spiriva   Respimat.  Obesity Obesity with recent weight loss. BMI below 35. Reports improved well-being and regular morning walks. - Encourage continued physical activity and healthy eating.  Osteopenia, right femur Osteopenia in right femur noted on 2023 bone density scan. - Order bone density scan.

## 2023-10-28 ENCOUNTER — Ambulatory Visit: Payer: Self-pay | Admitting: Family Medicine

## 2023-10-28 LAB — COMPREHENSIVE METABOLIC PANEL WITH GFR
AG Ratio: 2.1 (calc) (ref 1.0–2.5)
ALT: 11 U/L (ref 6–29)
AST: 14 U/L (ref 10–35)
Albumin: 4.4 g/dL (ref 3.6–5.1)
Alkaline phosphatase (APISO): 48 U/L (ref 37–153)
BUN: 21 mg/dL (ref 7–25)
CO2: 29 mmol/L (ref 20–32)
Calcium: 10 mg/dL (ref 8.6–10.4)
Chloride: 98 mmol/L (ref 98–110)
Creat: 0.92 mg/dL (ref 0.50–1.05)
Globulin: 2.1 g/dL (ref 1.9–3.7)
Glucose, Bld: 95 mg/dL (ref 65–99)
Potassium: 4.4 mmol/L (ref 3.5–5.3)
Sodium: 134 mmol/L — ABNORMAL LOW (ref 135–146)
Total Bilirubin: 0.3 mg/dL (ref 0.2–1.2)
Total Protein: 6.5 g/dL (ref 6.1–8.1)
eGFR: 67 mL/min/1.73m2

## 2023-10-28 LAB — CBC WITH DIFFERENTIAL/PLATELET
Absolute Lymphocytes: 1922 {cells}/uL (ref 850–3900)
Absolute Monocytes: 561 {cells}/uL (ref 200–950)
Basophils Absolute: 19 {cells}/uL (ref 0–200)
Basophils Relative: 0.3 %
Eosinophils Absolute: 50 {cells}/uL (ref 15–500)
Eosinophils Relative: 0.8 %
HCT: 40.7 % (ref 35.0–45.0)
Hemoglobin: 13 g/dL (ref 11.7–15.5)
MCH: 28.6 pg (ref 27.0–33.0)
MCHC: 31.9 g/dL — ABNORMAL LOW (ref 32.0–36.0)
MCV: 89.5 fL (ref 80.0–100.0)
MPV: 10.4 fL (ref 7.5–12.5)
Monocytes Relative: 8.9 %
Neutro Abs: 3749 {cells}/uL (ref 1500–7800)
Neutrophils Relative %: 59.5 %
Platelets: 232 Thousand/uL (ref 140–400)
RBC: 4.55 Million/uL (ref 3.80–5.10)
RDW: 13.5 % (ref 11.0–15.0)
Total Lymphocyte: 30.5 %
WBC: 6.3 Thousand/uL (ref 3.8–10.8)

## 2023-10-28 LAB — MICROALBUMIN / CREATININE URINE RATIO
Creatinine, Urine: 12 mg/dL — ABNORMAL LOW (ref 20–275)
Microalb Creat Ratio: 50 mg/g{creat} — ABNORMAL HIGH (ref <30)
Microalb, Ur: 0.6 mg/dL

## 2023-10-28 LAB — LIPID PANEL
Cholesterol: 139 mg/dL (ref <200)
HDL: 65 mg/dL (ref >=50)
LDL Cholesterol (Calc): 60 mg/dL
Non-HDL Cholesterol (Calc): 74 mg/dL (ref <130)
Total CHOL/HDL Ratio: 2.1 (calc) (ref <5.0)
Triglycerides: 49 mg/dL (ref <150)

## 2023-10-28 LAB — VITAMIN D 25 HYDROXY (VIT D DEFICIENCY, FRACTURES): Vit D, 25-Hydroxy: 60 ng/mL (ref 30–100)

## 2023-10-28 LAB — PARATHYROID HORMONE, INTACT (NO CA): PTH: 21 pg/mL (ref 16–77)

## 2023-11-01 ENCOUNTER — Other Ambulatory Visit: Payer: Self-pay

## 2023-11-12 ENCOUNTER — Other Ambulatory Visit: Payer: Self-pay

## 2023-11-19 ENCOUNTER — Ambulatory Visit (INDEPENDENT_AMBULATORY_CARE_PROVIDER_SITE_OTHER)

## 2023-11-19 ENCOUNTER — Other Ambulatory Visit: Payer: Self-pay

## 2023-11-19 DIAGNOSIS — Z23 Encounter for immunization: Secondary | ICD-10-CM

## 2023-12-02 ENCOUNTER — Ambulatory Visit
Admission: RE | Admit: 2023-12-02 | Discharge: 2023-12-02 | Disposition: A | Discharge status: Home / Self Care | Source: Ambulatory Visit | Attending: Family Medicine | Admitting: Family Medicine

## 2023-12-02 ENCOUNTER — Ambulatory Visit
Admission: RE | Admit: 2023-12-02 | Discharge: 2023-12-02 | Disposition: A | Source: Ambulatory Visit | Attending: Family Medicine | Admitting: Family Medicine

## 2023-12-02 DIAGNOSIS — M85851 Other specified disorders of bone density and structure, right thigh: Secondary | ICD-10-CM | POA: Diagnosis not present

## 2023-12-02 DIAGNOSIS — Z1231 Encounter for screening mammogram for malignant neoplasm of breast: Secondary | ICD-10-CM | POA: Insufficient documentation

## 2023-12-02 DIAGNOSIS — I152 Hypertension secondary to endocrine disorders: Secondary | ICD-10-CM | POA: Diagnosis not present

## 2023-12-02 DIAGNOSIS — E785 Hyperlipidemia, unspecified: Secondary | ICD-10-CM | POA: Diagnosis not present

## 2023-12-02 DIAGNOSIS — Z780 Asymptomatic menopausal state: Secondary | ICD-10-CM | POA: Diagnosis not present

## 2023-12-02 DIAGNOSIS — E1169 Type 2 diabetes mellitus with other specified complication: Secondary | ICD-10-CM | POA: Insufficient documentation

## 2023-12-02 DIAGNOSIS — M85852 Other specified disorders of bone density and structure, left thigh: Secondary | ICD-10-CM | POA: Diagnosis not present

## 2023-12-02 DIAGNOSIS — E1159 Type 2 diabetes mellitus with other circulatory complications: Secondary | ICD-10-CM | POA: Diagnosis not present

## 2023-12-24 ENCOUNTER — Other Ambulatory Visit: Payer: Self-pay

## 2023-12-26 ENCOUNTER — Other Ambulatory Visit: Payer: Self-pay

## 2024-01-05 ENCOUNTER — Other Ambulatory Visit: Payer: Self-pay

## 2024-01-16 ENCOUNTER — Other Ambulatory Visit: Payer: Self-pay | Admitting: Family Medicine

## 2024-01-16 DIAGNOSIS — I10 Essential (primary) hypertension: Secondary | ICD-10-CM

## 2024-01-17 ENCOUNTER — Other Ambulatory Visit: Payer: Self-pay

## 2024-01-18 ENCOUNTER — Other Ambulatory Visit: Payer: Self-pay

## 2024-01-18 ENCOUNTER — Other Ambulatory Visit: Payer: Self-pay | Admitting: Family Medicine

## 2024-01-18 DIAGNOSIS — I10 Essential (primary) hypertension: Secondary | ICD-10-CM

## 2024-01-19 ENCOUNTER — Other Ambulatory Visit: Payer: Self-pay

## 2024-01-20 ENCOUNTER — Other Ambulatory Visit: Payer: Self-pay

## 2024-01-20 MED FILL — Propranolol HCl Tab 10 MG: ORAL | 90 days supply | Qty: 180 | Fill #0 | Status: CN

## 2024-01-20 NOTE — Telephone Encounter (Signed)
 Requested Prescriptions  Pending Prescriptions Disp Refills   propranolol  (INDERAL ) 10 MG tablet 180 tablet 0    Sig: Take 1 tablet (10 mg total) by mouth 2 (two) times daily.     Cardiovascular:  Beta Blockers Passed - 01/20/2024 11:43 AM      Passed - Last BP in normal range    BP Readings from Last 1 Encounters:  10/27/23 124/82         Passed - Last Heart Rate in normal range    Pulse Readings from Last 1 Encounters:  10/27/23 84         Passed - Valid encounter within last 6 months    Recent Outpatient Visits           2 months ago Hyperlipidemia associated with type 2 diabetes mellitus [facility])   [facility] Glenard Mire, MD   6 months ago Hypertension associated with diabetes [facility])   [facility] Glenard Mire, MD   7 months ago Hypertension associated with diabetes [facility])   [facility] Sowles, Krichna, MD

## 2024-01-21 ENCOUNTER — Other Ambulatory Visit: Payer: Self-pay

## 2024-01-27 ENCOUNTER — Other Ambulatory Visit: Payer: Self-pay

## 2024-01-27 ENCOUNTER — Other Ambulatory Visit: Payer: Self-pay | Admitting: Family Medicine

## 2024-01-27 DIAGNOSIS — E1169 Type 2 diabetes mellitus with other specified complication: Secondary | ICD-10-CM

## 2024-01-27 MED FILL — Propranolol HCl Tab 10 MG: ORAL | 90 days supply | Qty: 180 | Fill #0 | Status: CN

## 2024-01-28 ENCOUNTER — Other Ambulatory Visit: Payer: Self-pay | Admitting: Family Medicine

## 2024-01-28 ENCOUNTER — Other Ambulatory Visit: Payer: Self-pay

## 2024-01-28 DIAGNOSIS — E1169 Type 2 diabetes mellitus with other specified complication: Secondary | ICD-10-CM

## 2024-01-29 ENCOUNTER — Other Ambulatory Visit: Payer: Self-pay

## 2024-02-02 ENCOUNTER — Other Ambulatory Visit: Payer: Self-pay

## 2024-02-02 MED FILL — Rosuvastatin Calcium Tab 40 MG: ORAL | 90 days supply | Qty: 90 | Fill #0 | Status: AC

## 2024-02-02 NOTE — Telephone Encounter (Signed)
 Requested Prescriptions  Pending Prescriptions Disp Refills   rosuvastatin  (CRESTOR ) 40 MG tablet 90 tablet 0    Sig: Take 1 tablet (40 mg total) by mouth daily.     Cardiovascular:  Antilipid - Statins 2 Failed - 02/02/2024  9:34 AM      Failed - Lipid Panel in normal range within the last 12 months    Cholesterol  Date Value Ref Range Status  10/27/2023 139 <200 mg/dL Final  90/87/7985 862 0 - 200 mg/dL Final   Ldl Cholesterol, Calc  Date Value Ref Range Status  10/22/2012 69 0 - 100 mg/dL Final   LDL Cholesterol (Calc)  Date Value Ref Range Status  10/27/2023 60 mg/dL (calc) Final    Comment:    Reference range: <100 . Desirable range <100 mg/dL for primary prevention;   <70 mg/dL for patients with CHD or diabetic patients  with > or = 2 CHD risk factors. SABRA LDL-C is now calculated using the Martin-Hopkins  calculation, which is a validated novel method providing  better accuracy than the Friedewald equation in the  estimation of LDL-C.  Gladis APPLETHWAITE et al. SANDREA. 7986;689(80): 2061-2068  (http://education.[facility].com/faq/FAQ164)    HDL Cholesterol  Date Value Ref Range Status  10/22/2012 56 40 - 60 mg/dL Final   HDL  Date Value Ref Range Status  10/27/2023 65 > OR = 50 mg/dL Final   Triglycerides  Date Value Ref Range Status  10/27/2023 49 <150 mg/dL Final  90/87/7985 62 0 - 200 mg/dL Final         Passed - Cr in normal range and within 360 days    Creat  Date Value Ref Range Status  10/27/2023 0.92 0.50 - 1.05 mg/dL Final   Creatinine, Urine  Date Value Ref Range Status  10/27/2023 12 (L) 20 - 275 mg/dL Final         Passed - Patient is not pregnant      Passed - Valid encounter within last 12 months    Recent Outpatient Visits           3 months ago Hyperlipidemia associated with type 2 diabetes mellitus [facility])   [facility] Glenard Mire, MD   6 months ago Hypertension associated with diabetes [facility])   [facility] Glenard Mire, MD   7 months ago Hypertension associated with diabetes [facility])   [facility] Sowles, Krichna, MD

## 2024-02-03 ENCOUNTER — Other Ambulatory Visit: Payer: Self-pay

## 2024-02-05 ENCOUNTER — Other Ambulatory Visit: Payer: Self-pay

## 2024-02-10 ENCOUNTER — Other Ambulatory Visit: Payer: Self-pay

## 2024-02-26 ENCOUNTER — Other Ambulatory Visit: Payer: Self-pay

## 2024-02-29 ENCOUNTER — Encounter: Payer: Self-pay | Admitting: Family Medicine

## 2024-02-29 ENCOUNTER — Ambulatory Visit: Admitting: Family Medicine

## 2024-02-29 ENCOUNTER — Other Ambulatory Visit: Payer: Self-pay

## 2024-02-29 VITALS — BP 108/68 | HR 91 | Resp 16 | Ht 59.0 in | Wt 157.1 lb

## 2024-02-29 DIAGNOSIS — J410 Simple chronic bronchitis: Secondary | ICD-10-CM

## 2024-02-29 DIAGNOSIS — E66811 Obesity, class 1: Secondary | ICD-10-CM

## 2024-02-29 DIAGNOSIS — I129 Hypertensive chronic kidney disease with stage 1 through stage 4 chronic kidney disease, or unspecified chronic kidney disease: Secondary | ICD-10-CM

## 2024-02-29 DIAGNOSIS — E1122 Type 2 diabetes mellitus with diabetic chronic kidney disease: Secondary | ICD-10-CM

## 2024-02-29 DIAGNOSIS — E785 Hyperlipidemia, unspecified: Secondary | ICD-10-CM

## 2024-02-29 DIAGNOSIS — N1831 Chronic kidney disease, stage 3a: Secondary | ICD-10-CM

## 2024-02-29 DIAGNOSIS — E1169 Type 2 diabetes mellitus with other specified complication: Secondary | ICD-10-CM

## 2024-02-29 DIAGNOSIS — Z7985 Long-term (current) use of injectable non-insulin antidiabetic drugs: Secondary | ICD-10-CM

## 2024-02-29 DIAGNOSIS — Z780 Asymptomatic menopausal state: Secondary | ICD-10-CM | POA: Insufficient documentation

## 2024-02-29 DIAGNOSIS — M8580 Other specified disorders of bone density and structure, unspecified site: Secondary | ICD-10-CM

## 2024-02-29 LAB — POCT GLYCOSYLATED HEMOGLOBIN (HGB A1C): Hemoglobin A1C: 6.4 % — AB (ref 4.0–5.6)

## 2024-02-29 MED ORDER — ROSUVASTATIN CALCIUM 40 MG PO TABS
40.0000 mg | ORAL_TABLET | Freq: Every day | ORAL | 1 refills | Status: AC
  Filled 2024-02-29: qty 90, 90d supply, fill #0

## 2024-02-29 MED ORDER — PIOGLITAZONE HCL 15 MG PO TABS
15.0000 mg | ORAL_TABLET | Freq: Every day | ORAL | 1 refills | Status: AC
  Filled 2024-02-29: qty 90, 90d supply, fill #0

## 2024-02-29 MED ORDER — PROPRANOLOL HCL 10 MG PO TABS
10.0000 mg | ORAL_TABLET | Freq: Two times a day (BID) | ORAL | 1 refills | Status: AC
  Filled 2024-02-29: qty 180, 90d supply, fill #0

## 2024-02-29 MED ORDER — VALSARTAN-HYDROCHLOROTHIAZIDE 80-12.5 MG PO TABS
1.0000 | ORAL_TABLET | Freq: Every day | ORAL | 1 refills | Status: AC
  Filled 2024-02-29: qty 90, 90d supply, fill #0

## 2024-02-29 MED ORDER — GLIPIZIDE ER 5 MG PO TB24
5.0000 mg | ORAL_TABLET | Freq: Every day | ORAL | 1 refills | Status: AC
  Filled 2024-02-29: qty 90, 90d supply, fill #0

## 2024-02-29 NOTE — Progress Notes (Signed)
 Name: Tracey Tucker   MRN: [id number]    DOB: [birth year]   Date:02/29/2024       Progress Note  Subjective  Chief Complaint  Chief Complaint  Patient presents with   Medical Management of Chronic Issues   Discussed the use of AI scribe software for clinical note transcription with the patient, who gave verbal consent to proceed.  History of Present Illness Tracey Tucker is a 69 year old female with diabetes, chronic kidney disease stage 3A, and hypertension who presents for a foot exam and diabetes management.  She manages her diabetes with pioglitazone  15 mg daily, glipizide  XL 2.5 mg - two tablets  daily, Xigduo  11/998 mg daily  and Ozempic  2 mg weekly. She experiences frequent urination without burning or incontinence. She feels hungry primarily at night and may not consume enough protein during the day. Her A1c has improved from 6.9% in September to 6.4% currently. She denies hypoglycemic episodes   Her chronic kidney disease stage 3A and hypertension are managed with valsartan  HCTZ 80/12.5 mg. She reports no issues with this medication. She also takes inderal  10 mg BID. Denies sob , chest pain or palpitation . BP today is towards low end of normal but denies orthostatic changes.   She has high cholesterol associated with diabetes, for which she takes rosuvastatin  40 mg daily without any reported problems.  She has a history of chronic bronchitis and continues to smoke four to five cigarettes a day. She uses Spiriva  and reports it helps. No daily coughing, phlegm production, shortness of breath, or wheezing. She walks every morning.  Her weight has decreased from 161 lbs to 157 lbs since her last visit.    Patient Active Problem List   Diagnosis Date Noted   Simple chronic bronchitis (HCC) 01/02/2021   Vitamin D  deficiency 01/02/2021   Dyslipidemia 01/02/2021   Tobacco abuse counseling 10/22/2017   Arthritis of left knee 09/18/2015   Hypertension associated with diabetes  (HCC) 08/22/2014   Hyperlipidemia associated with type 2 diabetes mellitus (HCC) 08/22/2014    Past Surgical History:  Procedure Laterality Date   CATARACT EXTRACTION Left 05/17/14   [facility] [place]   CATARACT EXTRACTION W/PHACO Right 07/26/2014   Procedure: CATARACT EXTRACTION PHACO AND INTRAOCULAR LENS PLACEMENT (I[facility]);  Surgeon: Dene Etienne, MD;  Location: [facility];  Service: Ophthalmology;  Laterality: Right;  DIABETIC   CESAREAN SECTION     FOOT SURGERY  2014    Family History  Problem Relation Age of Onset   Diabetes Father    Heart disease Father    Diabetes Sister    Hyperlipidemia Sister    Hypertension Sister    Hypertension Brother    Diabetes Brother    Diabetes Sister    Kidney disease Sister    Heart attack Sister    Diabetes Sister    Diabetes Sister    Diabetes Brother    Stomach cancer Brother    Stroke Brother    Breast cancer Neg Hx     Social History   Tobacco Use   Smoking status: Every Day    Current packs/day: 0.25    Average packs/day: 0.3 packs/day for 48.2 years (12.0 ttl pk-yrs)    Types: Cigarettes    Start date: 12/28/1975   Smokeless tobacco: Current    Types: Snuff  Substance Use Topics   Alcohol use: No    Alcohol/week: 0.0 standard drinks of alcohol    Current Medications[1]  Allergies[2]  I  personally reviewed active problem list, medication list, allergies, family history with the patient/caregiver today.   ROS  Ten systems reviewed and is negative except as mentioned in HPI    Objective Physical Exam  CONSTITUTIONAL: Patient appears well-developed and well-nourished. No distress. HEENT: Head atraumatic, normocephalic, neck supple. CARDIOVASCULAR: Normal rate, regular rhythm and normal heart sounds. No murmur heard. No BLE edema. PULMONARY: Effort normal. Breath sounds normal with presence of bronchi. No respiratory distress. ABDOMINAL: There is no tenderness or distention. MUSCULOSKELETAL: Normal  gait. Without gross motor or sensory deficit. Thick toenails, no significant abnormality. PSYCHIATRIC: Patient has a normal mood and affect. Behavior is normal. Judgment and thought content normal.  Vitals:   02/29/24 0825  BP: 108/68  Pulse: 91  Resp: 16  SpO2: 95%  Weight: 157 lb 1.6 oz (71.3 kg)  Height: 4' 11 (1.499 m)    Body mass index is 31.73 kg/m.  Recent Results (from the past 2160 hours)  POCT glycosylated hemoglobin (Hb A1C)     Status: Abnormal   Collection Time: 02/29/24  8:37 AM  Result Value Ref Range   Hemoglobin A1C 6.4 (A) 4.0 - 5.6 %   HbA1c POC (<> result, manual entry)     HbA1c, POC (prediabetic range)     HbA1c, POC (controlled diabetic range)      Diabetic Foot Exam:  Diabetic foot exam was performed with the following findings:   No deformities, ulcerations, or other skin breakdown Normal sensation of 10g monofilament Intact posterior tibialis and dorsalis pedis pulses      PHQ2/9:    02/29/2024    8:18 AM 10/27/2023   10:41 AM 09/10/2023    9:40 AM 07/21/2023    1:07 PM 06/23/2023    2:43 PM  Depression screen PHQ 2/9  Decreased Interest 0 0 0 0 0  Down, Depressed, Hopeless 0 0 0 0 0  PHQ - 2 Score 0 0 0 0 0  Altered sleeping  0 0    Tired, decreased energy  0 0    Change in appetite  0 0    Feeling bad or failure about yourself   0 0    Trouble concentrating  0 0    Moving slowly or fidgety/restless  0 0    Suicidal thoughts  0 0    PHQ-9 Score  0  0     Difficult doing work/chores  Not difficult at all Not difficult at all       Data saved with a previous flowsheet row definition    phq 9 is negative  Fall Risk:    02/29/2024    8:18 AM 10/27/2023   10:41 AM 09/10/2023    9:43 AM 07/21/2023    1:07 PM 06/23/2023    2:43 PM  Fall Risk   Falls in the past year? 0 0 0 0 0  Number falls in past yr: 0 0 0 0 0  Injury with Fall? 0 0  0  0  0   Risk for fall due to : No Fall Risks No Fall Risks No Fall Risks No Fall Risks No Fall  Risks  Follow up Falls evaluation completed Falls evaluation completed Falls evaluation completed;Falls prevention discussed Falls prevention discussed;Education provided;Falls evaluation completed Falls prevention discussed;Education provided;Falls evaluation completed     Data saved with a previous flowsheet row definition      Assessment & Plan Type 2 diabetes mellitus with stage 3a chronic kidney disease and hypertension Diabetes well-controlled  with A1c 6.4. CKD managed with kidney-protective medications. Hypertension managed with valsartan  HCTZ and propranolol . Weight loss noted, improving diabetes control. - Continue pioglitazone  15 mg daily. - Changed glipizide  to 5 mg once daily. - Continue Ozempic  2 mg weekly. - Continue Xigduo  XR 1000 mg daily. - Continue valsartan  HCTZ 80/12.5 mg daily. - Continue propranolol  10 mg twice daily. - Encouraged increased protein intake to prevent nocturnal hunger. - Advised to avoid NSAIDs like Aleve  Simple chronic bronchitis Managed with Spiriva . No cough, phlegm, shortness of breath, or wheezing. Smoking reduced to 4-5 cigarettes per day. - Continue Spiriva  as prescribed. - Advised to quit smoking, but she is not ready   Hyperlipidemia associated with type 2 diabetes mellitus Managed with rosuvastatin  40 mg daily. No issues reported. - Continue rosuvastatin  40 mg daily.  Stage 3a chronic kidney disease Managed with valsartan  and Xigduo  XR. Slight improvement in EGFR. - Continue valsartan  HCTZ 80/12.5 mg daily. - Continue Xigduo  XR 1000 mg daily. - Avoid NSAID's  Essential hypertension Managed with valsartan  HCTZ and propranolol . Blood pressure control adequate. - Continue valsartan  HCTZ 80/12.5 mg daily. - Continue propranolol  10 mg twice daily. - Recheck bp in 2 weeks, today BP is towards low end of normal, may be able to stop hydrochlorothiazide    Obesity, class 1 Recent weight loss from 161 lbs to 157 lbs. Weight loss improving  diabetes control. - Encouraged continued weight loss through diet and exercise.  Osteopenia after menopause Managed with vitamin D  and calcium  supplementation. - Continue vitamin D  and calcium  supplementation.       [1]  Current Outpatient Medications:    aspirin 81 MG tablet, Take 81 mg by mouth daily. PM, Disp: , Rfl:    Blood Glucose Monitoring Suppl (FREESTYLE LITE) DEVI, USE AS DIRECTED, Disp: 1 each, Rfl: 0   Cholecalciferol (VITAMIN D ) 2000 units CAPS, Take 1 capsule (2,000 Units total) by mouth daily., Disp: 90 capsule, Rfl: 1   Dapagliflozin  Pro-metFORMIN  ER (XIGDUO  XR) 11-998 MG TB24, Take 1 tablet by mouth daily with breakfast., Disp: 90 tablet, Rfl: 0   FREESTYLE LITE test strip, CHECK SUGARS ONCE A DAY, Disp: 100 strip, Rfl: 2   glipiZIDE  (GLUCOTROL  XL) 2.5 MG 24 hr tablet, Take 3 tablets (7.5 mg total) by mouth daily with breakfast., Disp: 270 tablet, Rfl: 1   pioglitazone  (ACTOS ) 15 MG tablet, Take 1 tablet (15 mg total) by mouth daily., Disp: 90 tablet, Rfl: 1   propranolol  (INDERAL ) 10 MG tablet, Take 1 tablet (10 mg total) by mouth 2 (two) times daily., Disp: 180 tablet, Rfl: 0   rosuvastatin  (CRESTOR ) 40 MG tablet, Take 1 tablet (40 mg total) by mouth daily., Disp: 90 tablet, Rfl: 0   Semaglutide , 2 MG/DOSE, (OZEMPIC , 2 MG/DOSE,) 8 MG/3ML SOPN, Inject 2 mg into the skin once a week., Disp: 12 mL, Rfl: 3   Tiotropium Bromide  Monohydrate (SPIRIVA  RESPIMAT) 2.5 MCG/ACT AERS, Inhale 2 puffs into the lungs daily., Disp: 12 g, Rfl: 3   valsartan -hydrochlorothiazide  (DIOVAN -HCT) 80-12.5 MG tablet, Take 1 tablet by mouth daily., Disp: 90 tablet, Rfl: 1 [2] No Known Allergies

## 2024-03-16 ENCOUNTER — Ambulatory Visit

## 2024-06-29 ENCOUNTER — Ambulatory Visit: Admitting: Family Medicine

## 2024-09-15 ENCOUNTER — Ambulatory Visit
